# Patient Record
Sex: Female | Born: 1950 | Race: White | Hispanic: No | Marital: Married | Smoking: Former smoker
Health system: Southern US, Community
[De-identification: ages and names within clinical notes are randomized; demographics above are authoritative.]

## PROBLEM LIST (undated history)

## (undated) DIAGNOSIS — E039 Hypothyroidism, unspecified: Secondary | ICD-10-CM

## (undated) DIAGNOSIS — I2510 Atherosclerotic heart disease of native coronary artery without angina pectoris: Secondary | ICD-10-CM

## (undated) DIAGNOSIS — R0989 Other specified symptoms and signs involving the circulatory and respiratory systems: Secondary | ICD-10-CM

## (undated) DIAGNOSIS — E785 Hyperlipidemia, unspecified: Secondary | ICD-10-CM

## (undated) DIAGNOSIS — R011 Cardiac murmur, unspecified: Secondary | ICD-10-CM

## (undated) DIAGNOSIS — M51369 Other intervertebral disc degeneration, lumbar region without mention of lumbar back pain or lower extremity pain: Secondary | ICD-10-CM

## (undated) DIAGNOSIS — I160 Hypertensive urgency: Secondary | ICD-10-CM

## (undated) DIAGNOSIS — K219 Gastro-esophageal reflux disease without esophagitis: Secondary | ICD-10-CM

## (undated) DIAGNOSIS — M545 Low back pain, unspecified: Secondary | ICD-10-CM

## (undated) DIAGNOSIS — I499 Cardiac arrhythmia, unspecified: Secondary | ICD-10-CM

## (undated) DIAGNOSIS — Z952 Presence of prosthetic heart valve: Secondary | ICD-10-CM

## (undated) DIAGNOSIS — R001 Bradycardia, unspecified: Secondary | ICD-10-CM

## (undated) DIAGNOSIS — Z9289 Personal history of other medical treatment: Secondary | ICD-10-CM

## (undated) DIAGNOSIS — M5136 Other intervertebral disc degeneration, lumbar region: Secondary | ICD-10-CM

## (undated) DIAGNOSIS — I10 Essential (primary) hypertension: Secondary | ICD-10-CM

## (undated) DIAGNOSIS — G8929 Other chronic pain: Secondary | ICD-10-CM

## (undated) DIAGNOSIS — I219 Acute myocardial infarction, unspecified: Secondary | ICD-10-CM

## (undated) DIAGNOSIS — F17201 Nicotine dependence, unspecified, in remission: Secondary | ICD-10-CM

## (undated) HISTORY — PX: HEEL SPUR SURGERY: SHX665

## (undated) HISTORY — DX: Essential (primary) hypertension: I10

## (undated) HISTORY — DX: Nicotine dependence, unspecified, in remission: F17.201

## (undated) HISTORY — DX: Bradycardia, unspecified: R00.1

## (undated) HISTORY — PX: HIP ORIF W/ CAPSULOTOMY: SHX1756

## (undated) HISTORY — DX: Personal history of other medical treatment: Z92.89

## (undated) HISTORY — PX: CATARACT EXTRACTION W/ INTRAOCULAR LENS  IMPLANT, BILATERAL: SHX1307

## (undated) HISTORY — DX: Hyperlipidemia, unspecified: E78.5

## (undated) HISTORY — DX: Presence of prosthetic heart valve: Z95.2

## (undated) HISTORY — PX: TOTAL HIP ARTHROPLASTY: SHX124

## (undated) HISTORY — DX: Other specified symptoms and signs involving the circulatory and respiratory systems: R09.89

## (undated) HISTORY — DX: Hypothyroidism, unspecified: E03.9

## (undated) HISTORY — PX: ABDOMINAL HYSTERECTOMY: SHX81

---

## 1976-11-22 HISTORY — PX: TONSILLECTOMY: SUR1361

## 2005-08-12 ENCOUNTER — Encounter: Admission: RE | Admit: 2005-08-12 | Discharge: 2005-08-12 | Payer: Self-pay | Admitting: Family Medicine

## 2009-02-20 ENCOUNTER — Ambulatory Visit: Payer: Self-pay | Admitting: Family Medicine

## 2009-02-20 DIAGNOSIS — M5430 Sciatica, unspecified side: Secondary | ICD-10-CM | POA: Insufficient documentation

## 2009-02-20 DIAGNOSIS — M479 Spondylosis, unspecified: Secondary | ICD-10-CM | POA: Insufficient documentation

## 2009-02-20 DIAGNOSIS — M47817 Spondylosis without myelopathy or radiculopathy, lumbosacral region: Secondary | ICD-10-CM

## 2009-02-20 DIAGNOSIS — I10 Essential (primary) hypertension: Secondary | ICD-10-CM

## 2009-05-17 ENCOUNTER — Inpatient Hospital Stay (HOSPITAL_COMMUNITY): Admission: EM | Admit: 2009-05-17 | Discharge: 2009-05-18 | Payer: Self-pay | Admitting: Emergency Medicine

## 2009-05-17 ENCOUNTER — Ambulatory Visit: Payer: Self-pay | Admitting: Internal Medicine

## 2009-05-18 ENCOUNTER — Encounter: Payer: Self-pay | Admitting: Internal Medicine

## 2009-07-10 ENCOUNTER — Ambulatory Visit: Payer: Self-pay | Admitting: Family Medicine

## 2009-07-10 DIAGNOSIS — M542 Cervicalgia: Secondary | ICD-10-CM

## 2009-07-11 DIAGNOSIS — R0989 Other specified symptoms and signs involving the circulatory and respiratory systems: Secondary | ICD-10-CM

## 2009-07-11 DIAGNOSIS — I498 Other specified cardiac arrhythmias: Secondary | ICD-10-CM

## 2009-07-11 DIAGNOSIS — R002 Palpitations: Secondary | ICD-10-CM | POA: Insufficient documentation

## 2009-07-11 DIAGNOSIS — R011 Cardiac murmur, unspecified: Secondary | ICD-10-CM

## 2009-07-14 ENCOUNTER — Ambulatory Visit: Payer: Self-pay | Admitting: Internal Medicine

## 2009-07-14 DIAGNOSIS — F17200 Nicotine dependence, unspecified, uncomplicated: Secondary | ICD-10-CM | POA: Insufficient documentation

## 2009-07-14 DIAGNOSIS — E7800 Pure hypercholesterolemia, unspecified: Secondary | ICD-10-CM | POA: Insufficient documentation

## 2009-07-31 ENCOUNTER — Ambulatory Visit: Payer: Self-pay | Admitting: Family Medicine

## 2009-07-31 DIAGNOSIS — M25519 Pain in unspecified shoulder: Secondary | ICD-10-CM | POA: Insufficient documentation

## 2009-08-05 ENCOUNTER — Encounter: Payer: Self-pay | Admitting: Internal Medicine

## 2009-08-05 ENCOUNTER — Ambulatory Visit: Payer: Self-pay

## 2009-08-05 ENCOUNTER — Ambulatory Visit: Payer: Self-pay | Admitting: Internal Medicine

## 2009-08-27 ENCOUNTER — Ambulatory Visit: Payer: Self-pay | Admitting: Family Medicine

## 2009-09-03 ENCOUNTER — Ambulatory Visit: Payer: Self-pay | Admitting: Family Medicine

## 2009-09-03 DIAGNOSIS — B369 Superficial mycosis, unspecified: Secondary | ICD-10-CM | POA: Insufficient documentation

## 2009-09-03 LAB — CONVERTED CEMR LAB
ALT: 33 units/L (ref 0–35)
Alkaline Phosphatase: 60 units/L (ref 39–117)
Basophils Relative: 0.9 % (ref 0.0–3.0)
Blood in Urine, dipstick: NEGATIVE
CO2: 29 meq/L (ref 19–32)
Calcium: 10 mg/dL (ref 8.4–10.5)
Cholesterol: 202 mg/dL — ABNORMAL HIGH (ref 0–200)
Direct LDL: 101.9 mg/dL
Eosinophils Absolute: 0.3 10*3/uL (ref 0.0–0.7)
Glucose, Urine, Semiquant: NEGATIVE
HDL: 33.9 mg/dL — ABNORMAL LOW (ref >39.00)
Hemoglobin: 16.1 g/dL — ABNORMAL HIGH (ref 12.0–15.0)
Ketones, urine, test strip: NEGATIVE
Lymphs Abs: 3.2 10*3/uL (ref 0.7–4.0)
MCHC: 33.8 g/dL (ref 30.0–36.0)
Monocytes Relative: 8.4 % (ref 3.0–12.0)
Platelets: 193 10*3/uL (ref 150.0–400.0)
Potassium: 4.6 meq/L (ref 3.5–5.1)
Protein, U semiquant: NEGATIVE
Specific Gravity, Urine: 1.01
TSH: 3.66 microintl units/mL (ref 0.35–5.50)
Total CHOL/HDL Ratio: 6
WBC Urine, dipstick: NEGATIVE
WBC: 10.8 10*3/uL — ABNORMAL HIGH (ref 4.5–10.5)
pH: 6.5

## 2010-01-20 ENCOUNTER — Telehealth: Payer: Self-pay | Admitting: Family Medicine

## 2010-02-12 ENCOUNTER — Ambulatory Visit: Payer: Self-pay | Admitting: Family Medicine

## 2010-02-12 DIAGNOSIS — M1610 Unilateral primary osteoarthritis, unspecified hip: Secondary | ICD-10-CM | POA: Insufficient documentation

## 2010-02-12 DIAGNOSIS — M169 Osteoarthritis of hip, unspecified: Secondary | ICD-10-CM

## 2010-02-12 DIAGNOSIS — L42 Pityriasis rosea: Secondary | ICD-10-CM

## 2010-03-19 ENCOUNTER — Telehealth: Payer: Self-pay | Admitting: Family Medicine

## 2010-06-18 ENCOUNTER — Ambulatory Visit: Payer: Self-pay | Admitting: Family Medicine

## 2010-06-18 DIAGNOSIS — B372 Candidiasis of skin and nail: Secondary | ICD-10-CM

## 2010-07-01 ENCOUNTER — Telehealth: Payer: Self-pay | Admitting: Family Medicine

## 2010-07-23 ENCOUNTER — Telehealth: Payer: Self-pay | Admitting: Family Medicine

## 2010-10-27 ENCOUNTER — Telehealth: Payer: Self-pay | Admitting: Family Medicine

## 2010-12-01 ENCOUNTER — Telehealth: Payer: Self-pay | Admitting: *Deleted

## 2010-12-20 LAB — CONVERTED CEMR LAB
AST: 28 units/L (ref 0–37)
Albumin: 4.2 g/dL (ref 3.5–5.2)
Alkaline Phosphatase: 68 units/L (ref 39–117)
BUN: 27 mg/dL — ABNORMAL HIGH (ref 6–23)
Bilirubin, Direct: 0 mg/dL (ref 0.0–0.3)
CO2: 29 meq/L (ref 19–32)
Calcium: 9.7 mg/dL (ref 8.4–10.5)
Chloride: 103 meq/L (ref 96–112)
Cholesterol: 165 mg/dL (ref 0–200)
Creatinine, Ser: 1 mg/dL (ref 0.4–1.2)
GFR calc non Af Amer: 60.03 mL/min (ref >60)
Glucose, Bld: 101 mg/dL — ABNORMAL HIGH (ref 70–99)
HDL: 30.9 mg/dL — ABNORMAL LOW (ref >39.00)
Total CHOL/HDL Ratio: 5
Total Protein: 8.5 g/dL — ABNORMAL HIGH (ref 6.0–8.3)
Triglycerides: 243 mg/dL — ABNORMAL HIGH (ref 0.0–149.0)
VLDL: 48.6 mg/dL — ABNORMAL HIGH (ref 0.0–40.0)

## 2010-12-22 NOTE — Progress Notes (Signed)
Summary: refill triamtere hctz   Phone Note Call from Patient   Caller: Patient Summary of Call: wantsa 90 day suppy on triamtere hctz  to [facility]  Initial call taken by: Pura Spice, RN,  January 20, 2010 9:04 AM  Follow-up for Phone Call        ok per dr Scotty Court p[t aware.  Follow-up by: Pura Spice, RN,  January 20, 2010 9:04 AM    Prescriptions: TRIAMTERENE-HCTZ 75-50 MG TABS (TRIAMTERENE-HCTZ) 1 once daily for edema and BP  #90 x 1   Entered by:   Pura Spice, RN   Authorized by:   Judithann Sheen MD   Signed by:   Pura Spice, RN on 01/20/2010   Method used:   Electronically to        [facility]* (retail)       [address] [place], [place]  [address]       Ph: [phone]       Fax: [phone]   RxID:   [id number]

## 2010-12-22 NOTE — Assessment & Plan Note (Signed)
Summary: FOLLOW UP ON INFECTION, NO BETTER//SLM   Vital Signs:  Patient profile:   60 year old female Height:      65 inches Weight:      171 pounds BMI:     28.56 O2 Sat:      96 % Temp:     98.6 degrees F Pulse rate:   70 / minute BP sitting:   120 / 82  (left arm)  Vitals Entered By: Pura Spice, RN (June 18, 2010 4:07 PM) CC: refils needed on prednisone and hydrocodone states prednisone hellped but when completed rash and itching came back. refill 90 rx on triamterene and 30 day supply on metoprolol    History of Present Illness: This 88 white married female continues to have back pain as well as some left shoulder pain but which at improved considerably when on prednisone for her dermatological problems there She is in today complaining of a rash under her breast and in the inguinal and perinetal area which itches tremendously when she is hot she continues to need her hydrocodone but has decreased the amount of tablets per day and under good control Blood pressure is well-controlled at 120/82  Allergies: 1)  ! 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  Father alive, he has a history of heart failure and   atrial fib status post cardioversion.  She has a brother, who is alive   and well.      Social History: Last updated: 07/14/2009 Occupation: [employer] employee Married She lives in [place] with her spouse.  She is a   Production designer, theatre/television/film for [employer].  She smokes  one-half pack of cigarettes a day.  No  diet restrictions.  Has regular coffee and sweet teat throughout the  day.  Denies any recent over-the-counter supplements or illicit   substances.   Risk Factors: Smoking Status: current (09/03/2009) Packs/Day: 0.5 (09/03/2009)  Review of Systems      See HPI  The patient denies anorexia, fever, weight loss, weight gain, vision loss, decreased hearing, hoarseness, chest pain, syncope, dyspnea on exertion, peripheral edema, prolonged cough, headaches, hemoptysis, abdominal pain, melena, hematochezia, severe indigestion/heartburn, hematuria, incontinence, genital sores, muscle weakness, suspicious skin lesions, transient blindness, difficulty walking, depression, unusual weight change, abnormal bleeding, enlarged lymph nodes, angioedema, breast masses, and testicular masses.    Physical Exam  General:  Well-developed,well-nourished,in no acute distress; alert,appropriate and cooperative throughout examination Breasts:  erythematous rash on the breast compatible with fungal infection of monilia Lungs:  Normal respiratory effort, chest expands symmetrically. Lungs are clear to auscultation, no crackles or wheezes. Heart:  Normal rate and regular rhythm. S1 and S2 normal without gallop, murmur, click, rub or other extra sounds. Msk:  No deformity or scoliosis noted of thoracic or lumbar spine.   Extremities:  No clubbing, cyanosis, edema, or deformity noted with normal full range of motion of all joints.   Neurologic:  No cranial nerve deficits noted. Station and gait are normal. Plantar reflexes are down-going bilaterally. DTRs are symmetrical throughout. Sensory, motor and coordinative functions appear intact. Skin:  erythematous rash under breast as  over her old monilial   Impression & Recommendations:  Problem # 1:  CANDIDIASIS, SKIN (ICD-112.3) Assessment New Lotrisone cream b.i.d. 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 Take once daily    Hydrocodone-acetaminophen 10-650 Mg Tabs (Hydrocodone-acetaminophen) .Marland Kitchen... 1 q4-6 hprn pain, not over 3 per day  Problem # 4:  ARTHRITIS, BACK (ICD-721.90) Assessment: Unchanged  Problem # 5:  HYPERTENSION (ICD-401.9) Assessment: Improved  Her updated medication list for this problem includes:    Lisinopril 20 Mg Tabs (Lisinopril) .Marland Kitchen... 1 by mouth once daily    Metoprolol Tartrate 25 Mg Tabs (Metoprolol tartrate) .Marland Kitchen..Marland Kitchen Two times a day    Triamterene-hctz 75-50 Mg Tabs (Triamterene-hctz) .Marland Kitchen... 1 once daily for edema and bp  Complete Medication List: 1)  Lisinopril 20 Mg Tabs (Lisinopril) .Marland Kitchen.. 1 by mouth once daily 2)  Aspirin 81 Mg Tabs (Aspirin) .... Take once daily 3)  Metoprolol Tartrate 25 Mg Tabs (Metoprolol tartrate) .... Two times a day 4)  Hydrocodone-acetaminophen 10-650 Mg Tabs (Hydrocodone-acetaminophen) .Marland Kitchen.. 1 q4-6 hprn pain, not over 3 per day 5)  Lotrisone 1-0.05 % Crea (Clotrimazole-betamethasone) .... Apply to radh two times a day thinly but thoroughly 6)  Triamterene-hctz 75-50 Mg Tabs (Triamterene-hctz) .Marland Kitchen.. 1 once daily for edema and bp 7)  Prednisone 20 Mg Tabs (Prednisone) .Marland Kitchen.. 1 three times a day pc x4 dayathen 1 two times a day x 6 days then 1 once daily for rash ,7days then 1/2 tab  Patient Instructions: 1)  Rashgeneralized better but  rash under breast returns 2)  taake prednisone also use lotrisone if not  improved call 3)  rrefilled hydrocodone Prescriptions: HYDROCODONE-ACETAMINOPHEN 10-650 MG TABS (HYDROCODONE-ACETAMINOPHEN) 1 q4-6 hprn pain, not over 3 per day  #100 x 5   Entered and Authorized by:   Judithann Sheen MD   Signed by:   Judithann Sheen MD on 06/18/2010   Method used:   Print then Give to Patient   RxID:    [id number] PREDNISONE 20 MG TABS (PREDNISONE) 1 three times a day pc x4 dayathen 1 two times a day x 6 days then 1 once daily for rash ,7days then 1/2 tab  #40 x 1   Entered and Authorized by:   Judithann Sheen MD   Signed by:   Judithann Sheen MD on 06/18/2010   Method used:   Electronically to        [facility] (retail)       [address]       [place], [place]  [address]       Ph: [phone]       Fax: [phone]   RxID:   [id number]

## 2010-12-22 NOTE — Progress Notes (Signed)
Summary: wants something for pain.   Phone Note Call from Patient   Caller: Patient Summary of Call: wants something else for pain that she can take while at work.call to [facility] Initial call taken by: Pura Spice, RN,  July 01, 2010 10:50 AM  Follow-up for Phone Call        per dr  Scotty Court call in tramodol 50 mg  done.  Follow-up by: Pura Spice, RN,  July 01, 2010 1:59 PM    New/Updated Medications: TRAMADOL HCL 50 MG TABS (TRAMADOL HCL) take 1 or 2 every 4-6 hrs as needed pain. Prescriptions: TRAMADOL HCL 50 MG TABS (TRAMADOL HCL) take 1 or 2 every 4-6 hrs as needed pain.  #60 x 1   Entered by:   Pura Spice, RN   Authorized by:   Judithann Sheen MD   Signed by:   Pura Spice, RN on 07/01/2010   Method used:   Electronically to        [facility] (retail)       [address]       [place], [place]  [address]       Ph: [phone]       Fax: [phone]   RxID:   [id number]

## 2010-12-22 NOTE — Assessment & Plan Note (Signed)
Summary: hip pain/rash /njr   Vital Signs:  Patient profile:   60 year old female Weight:      170 pounds O2 Sat:      96 % Temp:     97.7 degrees F Pulse rate:   96 / minute BP sitting:   140 / 80  (left arm)  Vitals Entered By: Pura Spice, RN (February 12, 2010 12:59 PM) CC: back pain and external rash    History of Present Illness: This 60 year old white female in today complaining of pain in the back as well as the right hip with a past several weeks. Has not been relieved with ibuprofen. Also with peds 5-7 days she developed a generalized rash Blood pressure controlled  Allergies: 1)  ! 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  She is a   Production designer, theatre/television/film for [employer].  She smokes one-half pack of cigarettes a day.  No  diet restrictions.  Has regular coffee and sweet teat throughout the  day.  Denies any recent over-the-counter supplements or illicit   substances.   Risk Factors: Smoking Status: current (09/03/2009) Packs/Day: 0.5 (09/03/2009)  Review of Systems  The patient denies anorexia, fever, weight loss, weight gain, vision loss, decreased hearing, hoarseness, chest pain, syncope, dyspnea on exertion, peripheral edema, prolonged cough, headaches, hemoptysis, abdominal pain, melena, hematochezia, severe indigestion/heartburn, hematuria, incontinence, genital sores, muscle weakness, suspicious skin lesions, transient blindness, difficulty walking,  depression, unusual weight change, abnormal bleeding, enlarged lymph nodes, angioedema, breast masses, and testicular masses.    Physical Exam  General:  Well-developed,well-nourished,in no acute distress; alert,appropriate and cooperative throughout examination Lungs:  Normal respiratory effort, chest expands symmetrically. Lungs are clear to auscultation, no crackles or wheezes. Heart:  Normal rate and regular rhythm. S1 and S2 normal without gallop, murmur, click, rub or other extra sounds. Msk:  tenderness over the right heel pain on movement also minimal tenderness over the right SI joint Pulses:  R and L carotid,radial,femoral,dorsalis pedis and posterior tibial pulses are full and equal bilaterally Skin:  generalized rash compatible with pityriasis rosea   Impression & Recommendations:  Problem # 1:  PITYRIASIS ROSEA (ICD-696.3) Assessment New prednisone  Problem # 2:  OSTEOARTHRITIS, HIP (ICD-715.95) Assessment: New  Her updated medication list for this problem includes:    Aspirin 81 Mg Tabs (Aspirin) .Marland Kitchen... Take once daily    Hydrocodone-acetaminophen 10-650 Mg Tabs (Hydrocodone-acetaminophen) .Marland Kitchen... 1 q4-6 hprn pain, not over 3 per day prednisone decrease in dosage  Problem # 3:  HYPERTENSION (ICD-401.9) Assessment: Improved  Her updated medication list for this problem includes:    Lisinopril 20 Mg Tabs (Lisinopril) .Marland Kitchen... 1 by mouth once daily    Metoprolol Tartrate 25 Mg Tabs (Metoprolol tartrate) .Marland Kitchen..Marland Kitchen Two times a day    Triamterene-hctz 75-50 Mg Tabs (Triamterene-hctz) .Marland Kitchen... 1 once daily for edema and bp  Complete Medication List: 1)  Lisinopril 20 Mg Tabs (Lisinopril) .Marland Kitchen.. 1 by mouth once daily 2)  Aspirin 81 Mg Tabs (Aspirin) .... Take once daily 3)  Metoprolol Tartrate 25 Mg Tabs (Metoprolol tartrate) .... Two times a day 4)  Hydrocodone-acetaminophen 10-650 Mg  Tabs (Hydrocodone-acetaminophen) .Marland Kitchen.. 1 q4-6 hprn pain, not over 3 per day 5)  Lotrisone 1-0.05 % Crea  (Clotrimazole-betamethasone) .... Apply to radh two times a day thinly but thoroughly 6)  Triamterene-hctz 75-50 Mg Tabs (Triamterene-hctz) .Marland Kitchen.. 1 once daily for edema and bp 7)  Prednisone 20 Mg Tabs (Prednisone) .Marland Kitchen.. 1 three times a day pc x4 dayathen 1 two times a day x 6 days then 1 once daily for rash and arthritis  Patient Instructions: 1)  arthritis in rt hip 2)  Rash generalized, pityriasis 3)  Take prednisone as directed 4)  blood pressure good 5)  refilled 1 prescription for hydrocodone Prescriptions: PREDNISONE 20 MG TABS (PREDNISONE) 1 three times a day pc x4 dayathen 1 two times a day x 6 days then 1 once daily for rash and arthritis  #36 x 0   Entered and Authorized by:   Judithann Sheen MD   Signed by:   Judithann Sheen MD on 02/12/2010   Method used:   Print then Give to Patient   RxID:   [id number] HYDROCODONE-ACETAMINOPHEN 10-650 MG TABS (HYDROCODONE-ACETAMINOPHEN) 1 q4-6 hprn pain, not over 3 per day  #100 x 0   Entered and Authorized by:   Judithann Sheen MD   Signed by:   Judithann Sheen MD on 02/12/2010   Method used:   Print then Give to Patient   RxID:   [id number]

## 2010-12-22 NOTE — Progress Notes (Signed)
Summary: Pt req refills for Metoprolol and Tramadol  Phone Note Refill Request Call back at Home Phone [phone]   Refills Requested: Medication #1:  METOPROLOL TARTRATE 25 MG TABS two times a day   Dosage confirmed as above?Dosage Confirmed  Medication #2:  TRAMADOL HCL 50 MG TABS take 1 or 2 every 4-6 hrs as needed pain..   Dosage confirmed as above?Dosage Confirmed Pls call in to [facility] on [facility] [address] [address]    Caryn Section Requested: Telephone to Pharmacy Initial call taken by: Lucy Antigua,  October 27, 2010 9:03 AM  Follow-up for Phone Call        pt call back concerning refills  Follow-up by: Heron Sabins,  October 29, 2010 9:07 AM  Additional Follow-up for Phone Call Additional follow up Details #1::        Rx called to pharmacy Additional Follow-up by: Alfred Levins, CMA,  October 30, 2010 8:32 AM    Prescriptions: TRAMADOL HCL 50 MG TABS (TRAMADOL HCL) take 1 or 2 every 4-6 hrs as needed pain.  #60 x 1   Entered by:   Alfred Levins, CMA   Authorized by:   Judithann Sheen MD   Signed by:   Alfred Levins, CMA on 10/30/2010   Method used:   Electronically to        [facility] (retail)       [address]       [place], [place]  [address]       Ph: [phone]       Fax: [phone]   RxID:   [id number] METOPROLOL TARTRATE 25 MG TABS (METOPROLOL TARTRATE) two times a day  #60 x 2   Entered by:   Alfred Levins, CMA   Authorized by:   Judithann Sheen MD   Signed by:   Alfred Levins, CMA on 10/30/2010   Method used:   Electronically to        [facility] (retail)       [address]       [place], [place]  [address]       Ph: [phone]       Fax: [phone]   RxID:   [id number]

## 2010-12-22 NOTE — Progress Notes (Signed)
Summary: Pt req refill for Hydrocodone. Pt having pharmacy send req  Phone Note Call from Patient Call back at Home Phone [phone]   Caller: Patient Summary of Call: Pt was calling in to get refill of Hydrocodone. Pt was instructed to contact pharmacy for refill req. Pt is still req Almira Coaster to return her call.  Initial call taken by: Lucy Antigua,  March 19, 2010 8:56 AM  Follow-up for Phone Call        spoke to pt call med to [facility] Follow-up by: Pura Spice, RN,  March 19, 2010 10:24 AM  Additional Follow-up for Phone Call Additional follow up Details #1::        called pt aware.  Additional Follow-up by: Pura Spice, RN,  March 19, 2010 10:26 AM    Prescriptions: HYDROCODONE-ACETAMINOPHEN 10-650 MG TABS (HYDROCODONE-ACETAMINOPHEN) 1 q4-6 hprn pain, not over 3 per day  #100 x 2   Entered by:   Pura Spice, RN   Authorized by:   Judithann Sheen MD   Signed by:   Pura Spice, RN on 03/19/2010   Method used:   Telephoned to ...       [facility] ([facility])       [address]       [place], [place]  [address]       Ph: [phone]       Fax: [phone]   RxID:   [id number]

## 2010-12-22 NOTE — Progress Notes (Signed)
Summary: refill metoprolol  Phone Note Call from Patient   Caller: pt  Summary of Call: [facility] Initial call taken by: Pura Spice, RN,  July 23, 2010 9:20 AM    Prescriptions: METOPROLOL TARTRATE 25 MG TABS (METOPROLOL TARTRATE) two times a day  #60 x 2   Entered by:   Josph Macho RMA   Authorized by:   Danise Edge MD   Signed by:   Josph Macho RMA on 07/23/2010   Method used:   Electronically to        [facility] (retail)       [address]       [place], [place]  [address]       Ph: [phone]       Fax: [phone]   RxID:   [id number]

## 2010-12-24 NOTE — Progress Notes (Signed)
Summary: Pt req refills for Lisinopril and Hydrocodone  Phone Note Refill Request Call back at Home Phone [phone] Message from:  Patient on December 01, 2010 8:20 AM  Refills Requested: Medication #1:  LISINOPRIL 20 MG TABS 1 by mouth once daily   Dosage confirmed as above?Dosage Confirmed  Medication #2:  HYDROCODONE-ACETAMINOPHEN 10-650 MG TABS 1 q4-6 hprn pain   Dosage confirmed as above?Dosage Confirmed  Method Requested: Telephone to Pharmacy Initial call taken by: Lucy Antigua,  December 01, 2010 8:20 AM  Follow-up for Phone Call        Rx sent to pharmacy on lisinopril. Follow-up by: Romualdo Bolk, CMA (AAMA),  December 01, 2010 11:50 AM    Prescriptions: HYDROCODONE-ACETAMINOPHEN 10-650 MG TABS (HYDROCODONE-ACETAMINOPHEN) 1 q4-6 hprn pain, not over 3 per day  #100 x 0   Entered by:   Romualdo Bolk, CMA (AAMA)   Authorized by:   Judithann Sheen MD   Signed by:   Romualdo Bolk, CMA (AAMA) on 12/01/2010   Method used:   Telephoned to ...       [facility])       [address]       [place], [place]  [address]       Ph: [phone]       Fax: [phone]   RxID:   [id number] LISINOPRIL 20 MG TABS (LISINOPRIL) 1 by mouth once daily  #30 x 0   Entered by:   Romualdo Bolk, CMA (AAMA)   Authorized by:   Judithann Sheen MD   Signed by:   Romualdo Bolk, CMA (AAMA) on 12/01/2010   Method used:   Electronically to        [facility] (retail)       [address]       [place], [place]  [address]       Ph: [phone]       Fax: [phone]   RxID:   [id number]  Rx called in. Romualdo Bolk, CMA (AAMA)  December 01, 2010 1:41 PM

## 2010-12-30 ENCOUNTER — Other Ambulatory Visit: Payer: Self-pay | Admitting: Family Medicine

## 2010-12-30 DIAGNOSIS — I10 Essential (primary) hypertension: Secondary | ICD-10-CM

## 2010-12-30 DIAGNOSIS — R52 Pain, unspecified: Secondary | ICD-10-CM

## 2010-12-30 DIAGNOSIS — R609 Edema, unspecified: Secondary | ICD-10-CM

## 2011-02-01 ENCOUNTER — Other Ambulatory Visit: Payer: Self-pay | Admitting: Family Medicine

## 2011-02-18 ENCOUNTER — Encounter: Payer: Self-pay | Admitting: Family Medicine

## 2011-03-01 LAB — COMPREHENSIVE METABOLIC PANEL
AST: 25 U/L (ref 0–37)
AST: 31 U/L (ref 0–37)
Albumin: 3.7 g/dL (ref 3.5–5.2)
Alkaline Phosphatase: 62 U/L (ref 39–117)
BUN: 21 mg/dL (ref 6–23)
CO2: 24 mEq/L (ref 19–32)
Calcium: 9.2 mg/dL (ref 8.4–10.5)
Chloride: 103 mEq/L (ref 96–112)
GFR calc Af Amer: 46 mL/min — ABNORMAL LOW (ref >60)
GFR calc Af Amer: 56 mL/min — ABNORMAL LOW (ref >60)
GFR calc non Af Amer: 38 mL/min — ABNORMAL LOW (ref >60)
GFR calc non Af Amer: 46 mL/min — ABNORMAL LOW (ref >60)
Glucose, Bld: 98 mg/dL (ref 70–99)
Potassium: 4 mEq/L (ref 3.5–5.1)
Potassium: 4 mEq/L (ref 3.5–5.1)
Sodium: 137 mEq/L (ref 135–145)
Sodium: 140 mEq/L (ref 135–145)
Total Protein: 7.2 g/dL (ref 6.0–8.3)
Total Protein: 7.6 g/dL (ref 6.0–8.3)

## 2011-03-01 LAB — CBC
HCT: 43 % (ref 36.0–46.0)
Hemoglobin: 16.3 g/dL — ABNORMAL HIGH (ref 12.0–15.0)
MCHC: 33.5 g/dL (ref 30.0–36.0)
MCV: 92.5 fL (ref 78.0–100.0)
MCV: 93.5 fL (ref 78.0–100.0)
Platelets: 211 10*3/uL (ref 150–400)
RBC: 4.65 MIL/uL (ref 3.87–5.11)
RBC: 5.19 MIL/uL — ABNORMAL HIGH (ref 3.87–5.11)
RDW: 14.9 % (ref 11.5–15.5)
WBC: 11.6 10*3/uL — ABNORMAL HIGH (ref 4.0–10.5)

## 2011-03-01 LAB — POCT I-STAT, CHEM 8
BUN: 26 mg/dL — ABNORMAL HIGH (ref 6–23)
Calcium, Ion: 1.06 mmol/L — ABNORMAL LOW (ref 1.12–1.32)
Glucose, Bld: 115 mg/dL — ABNORMAL HIGH (ref 70–99)
HCT: 50 % — ABNORMAL HIGH (ref 36.0–46.0)
Potassium: 3.9 mEq/L (ref 3.5–5.1)
Sodium: 140 mEq/L (ref 135–145)

## 2011-03-01 LAB — DIFFERENTIAL
Basophils Relative: 1 % (ref 0–1)
Eosinophils Absolute: 0.1 10*3/uL (ref 0.0–0.7)
Eosinophils Relative: 1 % (ref 0–5)
Lymphocytes Relative: 20 % (ref 12–46)
Lymphs Abs: 2.3 10*3/uL (ref 0.7–4.0)
Monocytes Absolute: 0.8 10*3/uL (ref 0.1–1.0)
Neutrophils Relative %: 72 % (ref 43–77)

## 2011-03-01 LAB — CARDIAC PANEL(CRET KIN+CKTOT+MB+TROPI)
Relative Index: INVALID (ref 0.0–2.5)
Troponin I: 0.02 ng/mL (ref 0.00–0.06)

## 2011-03-01 LAB — POCT CARDIAC MARKERS
Myoglobin, poc: 167 ng/mL (ref 12–200)
Troponin i, poc: <0.05 ng/mL (ref 0.00–0.09)

## 2011-03-01 LAB — TROPONIN I: Troponin I: 0.03 ng/mL (ref 0.00–0.06)

## 2011-03-08 ENCOUNTER — Other Ambulatory Visit: Payer: Self-pay | Admitting: Family Medicine

## 2011-04-06 NOTE — H&P (Signed)
NAMESINIA, ANTOSH                  ACCOUNT NO.:  [id number]   MEDICAL RECORD NO.:  [id number]          PATIENT TYPE:  INP   LOCATION:  3738                         FACILITY:  [facility]   PHYSICIAN:  Joylene John, MD       DATE OF BIRTH:  Apr 03, 1951   DATE OF ADMISSION:  05/17/2009  DATE OF DISCHARGE:                              HISTORY & PHYSICAL   CHIEF COMPLAINT:  Palpitations lasting for about 20-30 minutes this  morning.   HISTORY OF PRESENT ILLNESS:  This is a 60 year old female with  hypertension, heart valve condition (the patient tells me that it is one  of her manual valve, not sure if it is aortic or mitral) coming in with  palpitations that started around 9 o'clock this morning when the patient  was getting ready for work.  According to the patient, the palpitations  lasted about 20-30 minutes, resolved on their own.  The patient told me  that she was not feeling right and was shaking, over and over she tells  me that it was a scary event.  The patient denies any dizziness, nausea,  vomiting, diaphoresis, vision changes, fevers, or chills.  No recent  travels.  According to the patient, she has known that she has a heart  valve condition for last 5-6 years.  The murmur was discovered on exam  when she was in for foot spur surgery.  The patient has not seen a  cardiologist and does not know which valve it is, neither has had she  had an echo as far as that she can remember.  Appetite has been at  baseline she does not eat much in summers because of the heat.  She has  lost some weight, which is not intentional.  The patient tells me that  the stressors are the same as usual at work.  No new stressors.  Drinks  1 cup of coffee in the morning and 2-3 teas of cups during the day, and  no change in her caffeine intake.  No new exertion and no change in  exercise tolerance.  The patient tells me she has had episodes of  flutter, which lasts few seconds to minutes in the past, but  never like  this.  She cannot tell me if there have been any specific triggers for  these events.   PAST MEDICAL HISTORY:  Hypertension.  She was diagnosed a few years ago.  Systolic blood pressure was found to be in 200s.  She was placed on 2  medications.  At home, the patient tells me systolic blood pressure  usually run in the 170s.  She also has a valve problem, does not know  more details as to how severe of which valve.   FAMILY HISTORY:  Mother has a valve problem as well, not sure which one.  Father with CHF.   ALLERGIES:  No known allergies.   MEDICATIONS:  Hydrochlorothiazide 25 mg once a day and lisinopril 20 mg  once daily.  She has taken her blood pressure medications today.   SOCIAL HISTORY:  She works at [employer] as a Production designer, theatre/television/film.  Lives with her  husband.  Smoker since teenage years, one pack, last about 2 days.  No  alcohol or drugs.   REVIEW OF SYSTEMS:  See above.  Otherwise, 14-point review of systems is  negative.   Chest x-ray which was portable shows lower lobe atelectasis versus  scarring.  No acute pathology noted.  EKG; normal sinus rhythm, left  axis deviation, no old EKG for comparison.  No ST-T changes.   Pertinent labs showed cardiac enzymes first set negative.  Hemoglobin  16.3, 48.5, white count slightly elevated at 11.6, and platelets 211.  Sodium 140, potassium 3.9, chloride 109, bicarb 26, creatinine 1.6, no  baseline to compare, and glucose 115.  Ionized calcium little low at  1.06.   PHYSICAL EXAMINATION:  VITAL SIGNS:  Temperature of 98.5, pulse 88-72,  blood pressure 159/78 to 176/75, respirations 15-20, and the patient is  sating 98-96% on room air.  HEENT:  No icterus, pallor, or JVD appreciated.  No oral lesions  appreciated.  LUNGS:  Clear to auscultation.  CARDIOVASCULAR:  Regular rate and rhythm.  She does have a 3/6 murmur,  left sternal border radiating to the axilla, it is also heard on the  right upper sternal border.  LOWER  EXTREMITIES:  No edema appreciated.   This is a 60 year old female with hypertension, valve condition of  murmur on exam, coming in with palpitations.   Plan is to admit the patient to a tele bed, do serial cardiac enzymes,  and rule her out.  We will also get a TSH to make sure there is no  thyroid problems attributing to her palpitations.  Since the patient has  a murmur and no previous imaging, we will get an echocardiogram.  For  blood pressure control, we will continue her hydrochlorothiazide and  ACE.  We will add a low-dose beta-blocker at 25 mg q.12 h. with hold  parameters for heart rate and blood pressure.  The patient did receive  aspirin in the ER.  We will continue the aspirin.  Plan to repeat labs  and EKG in the morning since she did have a slightly elevated white  count, slightly elevated hemoglobin, and a slightly elevated creatinine.  I have no previous baseline to compare.  Based on the workup and the  lab, further assessment can be made if the patient needs inpatient  workup or can be followed up as outpatient.      Joylene John, MD  Electronically Signed     RP/MEDQ  D:  05/17/2009  T:  05/17/2009  Job:  [id number]

## 2011-04-06 NOTE — Consult Note (Signed)
NAMESANJA, Carrie [name]                  ACCOUNT NO.:  [id number]   MEDICAL RECORD NO.:  [id number]          PATIENT TYPE:  INP   LOCATION:  3738                         FACILITY:  [facility]   PHYSICIAN:  Hillis Range, MD       DATE OF BIRTH:  08-07-1951   DATE OF CONSULTATION:  05/18/2009  DATE OF DISCHARGE:  05/18/2009                                 CONSULTATION   Ms. Carrie [name] is new to our group, she will be followed by Dr. Hillis Range.   PRIMARY CARE PHYSICIAN:  Tawny Asal, MD   Ms. Carrie [name] is a pleasant 60 year old Caucasian female with no known history  of coronary artery disease, but states she was told several years ago  that she had a murmur but has never had followup for this.  She has  episodic fluttering in her chest but states that never lasted more than  a few seconds, normally does not bother her.  She does drink a large  amount of caffeine every day, but this has been an ongoing process for  her.  She is a Production designer, theatre/television/film at [employer].  She states she has a very stressful  job.  She was in her usual state of health yesterday morning she was in  the shower and getting ready for work when she had a sudden episode of  her heart racing.  She states it just took off.  She finished her  shower and continued to have the problem.  She states it lasted for  about 30 minutes.  She denies any presyncope, syncopal episodes, any  chest discomfort, lightheadedness or dizziness, headache or blurred  vision.  She states she just felt weak all over and her character, so  she decided to get evaluated.  By the time she arrived to the emergency  room, it had resolved.  A 12-lead EKG today showing sinus brady in 47.  The patient did have 1 episode on the monitor last night of SVT around  11 o'clock last night, resolved spontaneously.  At that time, the  patient did day state she had some fluttering in her chest and has had  no further problems, has been up ambulating in the hall, has appropriate  heart rate  response without any further palpitations.   PAST MEDICAL HISTORY:  1. Hypertension.  She is compliant with her medications.  2. Questionable valve problems.  3. Palpitations.  4. Tobacco abuse.   SOCIAL HISTORY:  She lives in [place] with her spouse.  She is a  Production designer, theatre/television/film for [employer].  She smokes one-half pack of cigarettes a day.  No  diet restrictions.  Has regular coffee and sweet teat throughout the  day.  Denies any recent over-the-counter supplements or illicit  substances.   FAMILY HISTORY:  Mother alive, she has a valve problem and  hypertension.  Father alive, he has a history of heart failure and  atrial fib status post cardioversion.  She has a brother, who is alive  and well.   REVIEW OF SYSTEMS:  Positive for palpitation, generalized weakness as  described above.  All other systems reviewed and negative.   CURRENT MEDICATIONS:  1. Aspirin 324.  2. Lovenox 40.  3. Hydrochlorothiazide 25.  4. Lisinopril 20.  5. Metoprolol 25 b.i.d.   ALLERGIES:  No known drug allergies.   PHYSICAL EXAMINATION:  VITAL SIGNS:  Temp 99.1, heart rate 48,  respirations 18, blood pressure 132/65, sat 97% on room air.  GENERAL:  No acute distress.  HEENT:  Unremarkable.  NECK:  Supple without lymphadenopathy.  The patient does have a bruit on  the right side.  CARDIOVASCULAR:  S1 and S2.  A 3/6 systolic ejection murmur noted.  LUNGS:  Clear to auscultation.  SKIN:  Warm and dry.  She does have a red rash on the face.  ABDOMEN:  Soft, nontender, positive bowel sounds.  LOWER EXTREMITIES:  Without clubbing, cyanosis, or edema.  NEUROLOGIC:  Alert and oriented x3.   Chest x-ray showing left lower lobe atelectasis versus scarring.  No  acute findings.  EKG, sinus brady at a rate of 47.   LABORATORY WORK:  Hematocrit 43, potassium 4, creatinine 1.1.  TSH  1.386.  Cardiac enzymes negative x3.   IMPRESSION:  1. Systolic ejection murmur.  The patient reports it has been there      for  many years.  No formal evaluation.  We will need      echocardiogram, this can be done outpatient.  2. Right carotid bruit.  We will arrange outpatient carotids.  3. Ongoing tobacco abuse.  Smoking cessation education has been      provided and strongly encouraged.  4. Hypertension.  Blood pressure well controlled.  5. Sinus bradycardia, beta-blocker induced.  The patient is      asymptomatic.  We will continue lisinopril, hydrochlorothiazide,      and metoprolol as started here in the hospital.  The patient is      okay to be discharged home from a cardiac perspective.  I will      range for her to follow up with Dr. Johney [name] after echocardiogram and      carotid Dopplers have been obtained.   Dr. Hillis Range has been into examine and assess.  The patient agrees  with plan of care.      Carrie [name], ACNP      Hillis Range, MD  Electronically Signed    MB/MEDQ  D:  05/18/2009  T:  05/19/2009  Job:  [id number]

## 2011-04-09 NOTE — Discharge Summary (Signed)
NAME:  Carrie [name], Carrie [name]                  ACCOUNT NO.:  [id number]   MEDICAL RECORD NO.:  [id number]          PATIENT TYPE:  INP   LOCATION:  3738                         FACILITY:  [facility]   PHYSICIAN:  Valerie A. Felicity Coyer, MDDATE OF BIRTH:  1951/08/25   DATE OF ADMISSION:  05/17/2009  DATE OF DISCHARGE:  05/18/2009                               DISCHARGE SUMMARY   DISCHARGE DIAGNOSES:  1. Palpitations, question supraventricular tachycardia status post      cardiology evaluation for outpatient echo and continued metoprolol.  2. Hypertension.  3. Systolic cardiac murmur.  4. Tobacco abuse.   DISCHARGE MEDICATIONS:  1. Metoprolol 25 mg b.i.d.  2. Lisinopril 20 mg daily.  3. Hydrochlorothiazide 25 mg daily.  4. Aspirin 81 mg daily.   Also provided prescription for Ambien 5 mg at bedtime p.r.n. insomnia,  dispense 30 with no refills.   HOSPITAL FOLLOWUP:  With Dr. Scotty Court, to call for appointment in 2  weeks following discharge from hospital.  Also with [facility],  Dr. Johney Frame, to be arranged for appointment in 4 weeks after discharge  for echocardiogram and Dopplers.   CONDITION ON DISCHARGE:  Medically improved and stable.   HOSPITAL COURSE:  1. Palpitations.  The patient is a 60 year old woman with history of      hypertension who came to the emergency room due to complaints of      chest tightness with feeling of racing heart beat and palpitations.      Please see admission H and P for full details.  She was admitted to      tele where there was a question of SVT but also noted significant      bradycardia while on beta-blockers.  TSH was normal.  Cardiac      enzymes were negative x3, but because of her symptoms as well as      presence of a cardiac murmur, she was scheduled for echo and      cardiology consult.  Cardiology saw the patient prior to echo being      performed and felt that she was stable for continued outpatient      workup given the patient's anxiousness  for discharge with      resolution of symptoms.  She is continued on low-dose beta-blocker      as described and other hypertension medications as here.  There was      no convincing evidence for SVT on telemetry and further outpatient      monitoring will be arranged by Cardiology      as indicated.  Please see consult note by Dr. Johney Frame for full      details.  2. Other medical issues, stable and she was continued on medications      as prior to admission.  She was also counseled during this      hospitalization on cigarette cessation for general health.      Valerie A. Felicity Coyer, MD  Electronically Signed     Raenette Rover. Felicity Coyer, MD  Electronically Signed    VAL/MEDQ  D:  07/16/2009  T:  07/17/2009  Job:  [id number]

## 2011-05-04 ENCOUNTER — Other Ambulatory Visit: Payer: Self-pay

## 2011-05-04 NOTE — Telephone Encounter (Signed)
Pt called requesting refill of hydrocodone; pt stated pharmacy was telling pt she could not get a refill until 02/17/2011.  Per pt she is no longer taking the Tramadol she flushed the rest of the pills and would like to get her hydrocodone.  The pharmacy was called and the pharmacist stated that the pt picked up a rx of tramadol on 5/17 #100 then again on 04/26/2011 #100; per the pharmacist the medication was held until the pt's doctor's office called in and confirmed her rx.  Per Dr. Scotty Court the pharmacy is to hold the hydrocodone until he speaks with the pt on 05/05/2011.  All rx for tramadol have been discontinued at this time;

## 2011-05-05 NOTE — Telephone Encounter (Signed)
Dr. Scotty Court called and spoke with Carrie [name] about tramadol and hydrocodone; per Dr. Scotty Court Carrie [name] needs to come in for an office visit and Carrie [name] can have a refill on her hydrocodone.  Per Carrie [name] the tramadol were making her sick and she flushed them down the toilet.

## 2011-05-05 NOTE — Telephone Encounter (Signed)
Spoke with pt and she is aware that Dr. Scotty Court would like to speak to her before any refills of hydrocodone are called in

## 2011-05-05 NOTE — Telephone Encounter (Signed)
Pt would like doc stafford to call her today

## 2011-05-06 ENCOUNTER — Encounter: Payer: Self-pay | Admitting: *Deleted

## 2011-05-06 ENCOUNTER — Telehealth: Payer: Self-pay

## 2011-05-06 NOTE — Telephone Encounter (Signed)
Pt called again and stated this is getting ridiculous pt called the pharmacy again and stated the pharmacist told pt her doctor needed to call the pharmacy.  Dr. Scotty Court is aware and has called the pharmacy.

## 2011-05-06 NOTE — Telephone Encounter (Signed)
Pt called and stated that she was confused about what Dr. Scotty Court told pt on yesterday.  Pt informed that per Dr. Scotty Court her hydrocodone would be filled 1 time but for further refills pt had to be seen.  Pt stated she understood and called the pharmacy last night and they told her that she could not have her refill.  Pt made an appointment for June 28,2012 and understands that she needs to be seen for further refills of medication.

## 2011-05-20 ENCOUNTER — Ambulatory Visit: Payer: Self-pay | Admitting: Family Medicine

## 2011-05-28 ENCOUNTER — Telehealth: Payer: Self-pay | Admitting: Family Medicine

## 2011-05-28 NOTE — Telephone Encounter (Signed)
Pt called and has rsc her ov from 06/01/11 to 06/17/11 because dr is going out of town. Pt req that Dr Scotty Court refill pts Hydrocodone 10-650 to [facility] on [address]. Med due to be refilled on 06/05/11.

## 2011-06-01 ENCOUNTER — Ambulatory Visit: Payer: Self-pay | Admitting: Family Medicine

## 2011-06-03 NOTE — Telephone Encounter (Signed)
Pt is aware that rx will be called in and ready for pick up on July 14th.

## 2011-06-03 NOTE — Telephone Encounter (Signed)
Pt called and is checking on status of getting refill of Hydrocodone called in to [facility] on [address]. Med Due to be refilled on 06/05/11. Pls call in asap today. Pt req this on 05/28/11.

## 2011-06-04 ENCOUNTER — Telehealth: Payer: Self-pay | Admitting: Family Medicine

## 2011-06-04 DIAGNOSIS — R52 Pain, unspecified: Secondary | ICD-10-CM

## 2011-06-04 MED ORDER — HYDROCODONE-ACETAMINOPHEN 10-650 MG PO TABS: 1.0000 | ORAL_TABLET | Freq: Four times a day (QID) | ORAL | Status: DC | PRN

## 2011-06-04 NOTE — Telephone Encounter (Signed)
Done

## 2011-06-04 NOTE — Telephone Encounter (Signed)
Ok per Dr. Scotty Court to call in rx for hydrocodone 10-650 #100 x0rf; pt has an appt on 06/04/2011

## 2011-06-04 NOTE — Telephone Encounter (Signed)
Refill Hydrocodone. Checking on status. [facility] on [address].

## 2011-06-09 ENCOUNTER — Other Ambulatory Visit: Payer: Self-pay | Admitting: Family Medicine

## 2011-06-17 ENCOUNTER — Ambulatory Visit (INDEPENDENT_AMBULATORY_CARE_PROVIDER_SITE_OTHER): Payer: Self-pay | Admitting: Family Medicine

## 2011-06-17 ENCOUNTER — Encounter: Payer: Self-pay | Admitting: Family Medicine

## 2011-06-17 VITALS — BP 160/102 | HR 73 | Temp 98.8°F | Wt 166.0 lb

## 2011-06-17 DIAGNOSIS — I10 Essential (primary) hypertension: Secondary | ICD-10-CM

## 2011-06-17 DIAGNOSIS — L259 Unspecified contact dermatitis, unspecified cause: Secondary | ICD-10-CM

## 2011-06-17 DIAGNOSIS — M7552 Bursitis of left shoulder: Secondary | ICD-10-CM

## 2011-06-17 DIAGNOSIS — K6289 Other specified diseases of anus and rectum: Secondary | ICD-10-CM

## 2011-06-17 DIAGNOSIS — G8929 Other chronic pain: Secondary | ICD-10-CM

## 2011-06-17 DIAGNOSIS — M719 Bursopathy, unspecified: Secondary | ICD-10-CM

## 2011-06-17 DIAGNOSIS — R52 Pain, unspecified: Secondary | ICD-10-CM

## 2011-06-17 DIAGNOSIS — L309 Dermatitis, unspecified: Secondary | ICD-10-CM

## 2011-06-17 DIAGNOSIS — M545 Low back pain: Secondary | ICD-10-CM

## 2011-06-17 MED ORDER — PREDNISONE 10 MG PO TABS: ORAL_TABLET | ORAL | Status: DC

## 2011-06-17 MED ORDER — LISINOPRIL 20 MG PO TABS: ORAL_TABLET | ORAL | Status: DC

## 2011-06-17 MED ORDER — HYDROCODONE-ACETAMINOPHEN 10-500 MG PO TABS: 1.0000 | ORAL_TABLET | ORAL | Status: AC | PRN

## 2011-06-17 MED ORDER — TRAMADOL HCL 50 MG PO TABS: 50.0000 mg | ORAL_TABLET | Freq: Four times a day (QID) | ORAL | Status: DC | PRN

## 2011-06-17 MED ORDER — METOPROLOL TARTRATE 25 MG PO TABS: ORAL_TABLET | ORAL | Status: DC

## 2011-06-17 MED ORDER — CLOTRIMAZOLE-BETAMETHASONE 1-0.05 % EX CREA: 1.0000 "application " | TOPICAL_CREAM | Freq: Two times a day (BID) | CUTANEOUS | Status: DC

## 2011-06-17 NOTE — Patient Instructions (Signed)
You have subdeltoid and acromioclavicular bursitis left shoulder Chronic back pain Buy stool softner to stop the rectal irrtati To treat shoulder and rash with prednisone Rash also tx with lotrisone cream twice daily or least 2 weeks

## 2011-06-21 MED ORDER — PREDNISONE 10 MG PO TABS: ORAL_TABLET | ORAL | Status: AC

## 2011-07-28 DIAGNOSIS — M7552 Bursitis of left shoulder: Secondary | ICD-10-CM | POA: Insufficient documentation

## 2011-07-28 NOTE — Progress Notes (Signed)
  Subjective:    Patient ID: Carrie [name], female    DOB: [birth year], 60 y.o.   MRN: [id number] This 60 year old white single female is in for medicine recheck and refills but systolic complains of some rectal bleeding with red blood when she has hard stools Darl Pikes and frequent problem. He said problems and pain in the left shoulder with some swelling over the acromioclavicular joint this is occurred over the past 4-6 weeks pain on collation of the arm and posterior movement is history of chronic low back pain with sciatica on systems review no other new symptoms HPI    Review of Systems see history of present     Objective:   Physical Exam the patient is a well-developed well-nourished slightly overweight white female in no distress Examination of the left shoulder reveals tenderness over the acromioclavicular bursa as well as the subdeltoid bursa, pain on palpation as well as pain on elevation and posterior movement Heart examination reveals grade 2 systolic aortic murmur [place] the back reveals tenderness over the right SI joint Skin nonspecific erythematous ectopic dermatitis over the arms and lower extremities Rectal examination reveals evidence of mild proctitis otherwise negative        Assessment & Plan:  Mild rectal bleeding secondary to proctitis, to add stool softener for hard stools other medications needed Hypertension blood pressure was 160/102 rechecked was 142/90 change in treatment Acromioclavicular bursitis and subdeltoid bursitis left shoulder to treat with prednisone decreasing dosage Dermatitis give with oral prednisone Chronic back pain continue hydrocodone see him in for an and patient can not NSAIDs will also be help with the prednisone treatment

## 2012-12-31 ENCOUNTER — Encounter (HOSPITAL_COMMUNITY): Payer: Self-pay | Admitting: Nurse Practitioner

## 2012-12-31 ENCOUNTER — Emergency Department (HOSPITAL_COMMUNITY): Payer: Self-pay

## 2012-12-31 ENCOUNTER — Emergency Department (HOSPITAL_COMMUNITY)
Admission: EM | Admit: 2012-12-31 | Discharge: 2012-12-31 | Disposition: A | Discharge status: Home / Self Care | Payer: Self-pay | Attending: Emergency Medicine | Admitting: Emergency Medicine

## 2012-12-31 DIAGNOSIS — R5383 Other fatigue: Secondary | ICD-10-CM | POA: Insufficient documentation

## 2012-12-31 DIAGNOSIS — R0602 Shortness of breath: Secondary | ICD-10-CM | POA: Insufficient documentation

## 2012-12-31 DIAGNOSIS — R5381 Other malaise: Secondary | ICD-10-CM | POA: Insufficient documentation

## 2012-12-31 DIAGNOSIS — M129 Arthropathy, unspecified: Secondary | ICD-10-CM | POA: Insufficient documentation

## 2012-12-31 DIAGNOSIS — I10 Essential (primary) hypertension: Secondary | ICD-10-CM | POA: Insufficient documentation

## 2012-12-31 DIAGNOSIS — R002 Palpitations: Secondary | ICD-10-CM | POA: Insufficient documentation

## 2012-12-31 DIAGNOSIS — Z8679 Personal history of other diseases of the circulatory system: Secondary | ICD-10-CM | POA: Insufficient documentation

## 2012-12-31 DIAGNOSIS — F17200 Nicotine dependence, unspecified, uncomplicated: Secondary | ICD-10-CM | POA: Insufficient documentation

## 2012-12-31 DIAGNOSIS — R011 Cardiac murmur, unspecified: Secondary | ICD-10-CM | POA: Insufficient documentation

## 2012-12-31 DIAGNOSIS — Z79899 Other long term (current) drug therapy: Secondary | ICD-10-CM | POA: Insufficient documentation

## 2012-12-31 LAB — CBC
HCT: 42.2 % (ref 36.0–46.0)
MCHC: 35.5 g/dL (ref 30.0–36.0)
Platelets: 155 10*3/uL (ref 150–400)
RDW: 13.9 % (ref 11.5–15.5)
WBC: 6.8 10*3/uL (ref 4.0–10.5)

## 2012-12-31 LAB — BASIC METABOLIC PANEL
BUN: 12 mg/dL (ref 6–23)
Chloride: 100 mEq/L (ref 96–112)
Creatinine, Ser: 0.7 mg/dL (ref 0.50–1.10)
GFR calc Af Amer: >90 mL/min (ref >90)
GFR calc non Af Amer: >90 mL/min (ref >90)
Potassium: 4 mEq/L (ref 3.5–5.1)

## 2012-12-31 LAB — POCT I-STAT TROPONIN I

## 2012-12-31 LAB — PRO B NATRIURETIC PEPTIDE: Pro B Natriuretic peptide (BNP): 4465 pg/mL — ABNORMAL HIGH (ref 0.0–100.0)

## 2012-12-31 MED ORDER — TRAMADOL HCL 50 MG PO TABS
50.0000 mg | ORAL_TABLET | Freq: Once | ORAL | Status: AC
  Administered 2012-12-31: 50 mg via ORAL
  Filled 2012-12-31: qty 1

## 2012-12-31 NOTE — ED Notes (Signed)
Pt states she has felt dizzy and "like my heart is racing" since this afternoon. Denies pain. Reports mild SOB. A&Ox4

## 2012-12-31 NOTE — ED Provider Notes (Signed)
History     CSN: [id number]  Arrival date & time 12/31/12  2028   First MD Initiated Contact with Patient 12/31/12 2226      Chief Complaint  Patient presents with  . Irregular Heart Beat    (Consider location/radiation/quality/duration/timing/severity/associated sxs/prior treatment) HPI  Pt with a history of a systolic murmur presents to the ED with complaint of SOB, heart racing, weakness around 6:30pm. She has a known history of a systolic murmur that's being followed by [facility].  She expresses having intermittent lower extremity swelling over the past few months. As well as some shortness of breath intermittently. Pts asymptomatic upon arriving to the ED.   Past Medical History  Diagnosis Date  . Hypertension   . Systolic murmur   . Carotid bruit   . Bradycardia   . Palpitations   . Arthritis     Past Surgical History  Procedure Laterality Date  . Heel spur surgery      rt foot    Family History  Problem Relation Age of Onset  . Hypertension Mother   . Heart disease Mother   . Hypertension Father   . Heart disease Father     History  Substance Use Topics  . Smoking status: Current Every Day Smoker    Types: Cigarettes  . Smokeless tobacco: Never Used     Comment: 1 pack of cigarettes will last 1.5 days   . Alcohol Use: No    OB History   Grav Para Term Preterm Abortions TAB SAB Ect Mult Living                  Review of Systems  Review of Systems  Gen: no weight loss, fevers, chills, night sweats  Eyes: no discharge or drainage, no occular pain or visual changes  Nose: no epistaxis or rhinorrhea  Mouth: no dental pain, no sore throat  Neck: no neck pain  Lungs:No wheezing, coughing or hemoptysis, SOB CV: no chest pain, palpitations, dependent edema or orthopnea + palpitations Abd: no abdominal pain, nausea, vomiting  GU: no dysuria or gross hematuria  MSK:  Intermittent lower extremity swelling   Neuro: no headache, no focal  neurologic deficits  Skin: no abnormalities Psyche: negative.   Allergies  Morphine  Home Medications   Current Outpatient Rx  Name  Route  Sig  Dispense  Refill  . acetaminophen (TYLENOL) 500 MG tablet   Oral   Take 1,000 mg by mouth every 6 (six) hours as needed for pain. For pai n         . hydrochlorothiazide (HYDRODIURIL) 50 MG tablet   Oral   Take 100 mg by mouth daily.         Marland Kitchen HYDROcodone-acetaminophen (NORCO) 10-325 MG per tablet   Oral   Take 1 tablet by mouth every 6 (six) hours as needed for pain. For pain         . metoprolol (LOPRESSOR) 50 MG tablet   Oral   Take 50 mg by mouth 2 (two) times daily.         . traMADol (ULTRAM) 50 MG tablet   Oral   Take 1 tablet (50 mg total) by mouth every 6 (six) hours as needed for pain.   100 tablet   11     BP 176/82  Pulse 57  Temp(Src) 97 F (36.1 C) (Oral)  Resp 18  SpO2 97%  Physical Exam  Nursing note and vitals reviewed. Constitutional: She appears well-developed  and well-nourished. No distress.  HENT:  Head: Normocephalic and atraumatic.  Eyes: Pupils are equal, round, and reactive to light.  Neck: Normal range of motion. Neck supple.  Cardiovascular: Normal rate and regular rhythm.   Murmur heard.  Systolic murmur is present with a grade of 4/6  Pulmonary/Chest: Effort normal.  Abdominal: Soft.  Neurological: She is alert.  Skin: Skin is warm and dry.    ED Course  Procedures (including critical care time)  Labs Reviewed  BASIC METABOLIC PANEL - Abnormal; Notable for the following:    Glucose, Bld 137 (*)    All other components within normal limits  PRO B NATRIURETIC PEPTIDE - Abnormal; Notable for the following:    Pro B Natriuretic peptide (BNP) 4465.0 (*)    All other components within normal limits  CBC  POCT I-STAT TROPONIN I   No results found.   1. Palpitations       MDM   Date: 12/31/2012  Rate: 62  Rhythm: normal sinus rhythm  QRS Axis: normal  Intervals:  normal  ST/T Wave abnormalities: normal  Conduction Disutrbances:LVH   Narrative Interpretation:   Old EKG Reviewed: none available   Trop, BNP, chest xray and labs are not acute. Findings are not all normal but they do not warrant immediate intervention. Pt no longer having symptoms since in the ER. I discussed case with [facility] and Dr. Anitra Lauth. Pt will need repeat US and halter monitor. Advised to return to the ED if symptoms return.  Pt has been advised of the symptoms that warrant their return to the ED. Patient has voiced understanding and has agreed to follow-up with the PCP or specialist.       Dorthula Matas, PA 01/03/13 [phone]

## 2013-01-03 NOTE — ED Provider Notes (Signed)
Medical screening examination/treatment/procedure(s) were performed by non-physician practitioner and as supervising physician I was immediately available for consultation/collaboration.   Sutton Plake, MD 01/03/13 2103 

## 2013-04-23 ENCOUNTER — Encounter (HOSPITAL_COMMUNITY): Payer: Self-pay | Admitting: Emergency Medicine

## 2013-04-23 ENCOUNTER — Emergency Department (HOSPITAL_COMMUNITY)
Admission: EM | Admit: 2013-04-23 | Discharge: 2013-04-23 | Disposition: A | Discharge status: Home / Self Care | Payer: Self-pay | Attending: Emergency Medicine | Admitting: Emergency Medicine

## 2013-04-23 ENCOUNTER — Emergency Department (HOSPITAL_COMMUNITY): Payer: Self-pay

## 2013-04-23 DIAGNOSIS — R071 Chest pain on breathing: Secondary | ICD-10-CM | POA: Insufficient documentation

## 2013-04-23 DIAGNOSIS — R011 Cardiac murmur, unspecified: Secondary | ICD-10-CM | POA: Insufficient documentation

## 2013-04-23 DIAGNOSIS — Z79899 Other long term (current) drug therapy: Secondary | ICD-10-CM | POA: Insufficient documentation

## 2013-04-23 DIAGNOSIS — M129 Arthropathy, unspecified: Secondary | ICD-10-CM | POA: Insufficient documentation

## 2013-04-23 DIAGNOSIS — F17200 Nicotine dependence, unspecified, uncomplicated: Secondary | ICD-10-CM | POA: Insufficient documentation

## 2013-04-23 DIAGNOSIS — Z8679 Personal history of other diseases of the circulatory system: Secondary | ICD-10-CM | POA: Insufficient documentation

## 2013-04-23 DIAGNOSIS — R0789 Other chest pain: Secondary | ICD-10-CM

## 2013-04-23 DIAGNOSIS — I10 Essential (primary) hypertension: Secondary | ICD-10-CM | POA: Insufficient documentation

## 2013-04-23 LAB — BASIC METABOLIC PANEL
Calcium: 10.1 mg/dL (ref 8.4–10.5)
Creatinine, Ser: 1.35 mg/dL — ABNORMAL HIGH (ref 0.50–1.10)
GFR calc Af Amer: 47 mL/min — ABNORMAL LOW (ref >90)
GFR calc non Af Amer: 41 mL/min — ABNORMAL LOW (ref >90)
Sodium: 135 mEq/L (ref 135–145)

## 2013-04-23 LAB — CBC WITH DIFFERENTIAL/PLATELET
Basophils Absolute: 0.1 10*3/uL (ref 0.0–0.1)
Basophils Relative: 0 % (ref 0–1)
Eosinophils Relative: 1 % (ref 0–5)
HCT: 44.3 % (ref 36.0–46.0)
MCHC: 35.9 g/dL (ref 30.0–36.0)
MCV: 90.6 fL (ref 78.0–100.0)
Monocytes Absolute: 0.9 10*3/uL (ref 0.1–1.0)
Platelets: 255 10*3/uL (ref 150–400)
RDW: 15.7 % — ABNORMAL HIGH (ref 11.5–15.5)
WBC: 12.5 10*3/uL — ABNORMAL HIGH (ref 4.0–10.5)

## 2013-04-23 LAB — D-DIMER, QUANTITATIVE: D-Dimer, Quant: 0.41 ug/mL-FEU (ref 0.00–0.48)

## 2013-04-23 MED ORDER — IBUPROFEN 800 MG PO TABS: 800.0000 mg | ORAL_TABLET | Freq: Three times a day (TID) | ORAL | Status: DC

## 2013-04-23 MED ORDER — HYDROCODONE-ACETAMINOPHEN 5-325 MG PO TABS: 2.0000 | ORAL_TABLET | ORAL | Status: DC | PRN

## 2013-04-23 NOTE — ED Provider Notes (Signed)
History     CSN: [id number]  Arrival date & time 04/23/13  9562   First MD Initiated Contact with Patient 04/23/13 [phone]      Chief Complaint  Patient presents with  . Breast Pain    (Consider location/radiation/quality/duration/timing/severity/associated sxs/prior treatment) HPI Comments: Patient presents to ER with complaints of pain under her left breast area. Patient reports that she is experiencing a sharp pain under her left breast and lateral to the head area that worsens with movement. Pain has been present for several days. She is not short of breath, but the pain worsens when she takes a breath. She denies any injury to the area.   Past Medical History  Diagnosis Date  . Hypertension   . Systolic murmur   . Carotid bruit   . Bradycardia   . Palpitations   . Arthritis     Past Surgical History  Procedure Laterality Date  . Heel spur surgery      rt foot    Family History  Problem Relation Age of Onset  . Hypertension Mother   . Heart disease Mother   . Hypertension Father   . Heart disease Father     History  Substance Use Topics  . Smoking status: Current Every Day Smoker    Types: Cigarettes  . Smokeless tobacco: Never Used     Comment: 1 pack of cigarettes will last 1.5 days   . Alcohol Use: No    OB History   Grav Para Term Preterm Abortions TAB SAB Ect Mult Living                  Review of Systems  Respiratory: Negative for cough and shortness of breath.   Cardiovascular: Positive for chest pain.  All other systems reviewed and are negative.    Allergies  Morphine  Home Medications   Current Outpatient Rx  Name  Route  Sig  Dispense  Refill  . acetaminophen (TYLENOL) 500 MG tablet   Oral   Take 500 mg by mouth every 6 (six) hours as needed for pain.          . metoprolol (LOPRESSOR) 50 MG tablet   Oral   Take 50 mg by mouth 2 (two) times daily.         . traMADol (ULTRAM) 50 MG tablet   Oral   Take 1 tablet (50 mg  total) by mouth every 6 (six) hours as needed for pain.   100 tablet   11   . triamterene-hydrochlorothiazide (MAXZIDE) 75-50 MG per tablet   Oral   Take 1 tablet by mouth daily.           BP 171/80  Pulse 65  Temp(Src) 97.8 F (36.6 C) (Oral)  Resp 16  SpO2 100%  Physical Exam  Constitutional: She is oriented to person, place, and time. She appears well-developed and well-nourished. No distress.  HENT:  Head: Normocephalic and atraumatic.  Right Ear: Hearing normal.  Left Ear: Hearing normal.  Nose: Nose normal.  Mouth/Throat: Oropharynx is clear and moist and mucous membranes are normal.  Eyes: Conjunctivae and EOM are normal. Pupils are equal, round, and reactive to light.  Neck: Normal range of motion. Neck supple.  Cardiovascular: Regular rhythm, S1 normal and S2 normal.  Exam reveals no gallop and no friction rub.   No murmur heard. Pulmonary/Chest: Effort normal and breath sounds normal. No respiratory distress. She exhibits tenderness. She exhibits no crepitus.    Abdominal: Soft.  Normal appearance and bowel sounds are normal. There is no hepatosplenomegaly. There is no tenderness. There is no rebound, no guarding, no tenderness at McBurney's point and negative Murphy's sign. No hernia.  Musculoskeletal: Normal range of motion.  Neurological: She is alert and oriented to person, place, and time. She has normal strength. No cranial nerve deficit or sensory deficit. Coordination normal. GCS eye subscore is 4. GCS verbal subscore is 5. GCS motor subscore is 6.  Skin: Skin is warm, dry and intact. No rash noted. No cyanosis.  Psychiatric: She has a normal mood and affect. Her speech is normal and behavior is normal. Thought content normal.    ED Course  Procedures (including critical care time)  Labs Reviewed  CBC WITH DIFFERENTIAL - Abnormal; Notable for the following:    WBC 12.5 (*)    Hemoglobin 15.9 (*)    RDW 15.7 (*)    Neutro Abs 9.4 (*)    All other  components within normal limits  BASIC METABOLIC PANEL - Abnormal; Notable for the following:    Glucose, Bld 117 (*)    BUN 27 (*)    Creatinine, Ser 1.35 (*)    GFR calc non Af Amer 41 (*)    GFR calc Af Amer 47 (*)    All other components within normal limits  D-DIMER, QUANTITATIVE   Dg Ribs Unilateral W/chest Left  04/23/2013   *RADIOLOGY REPORT*  Clinical Data: Status post fall approximately 2 weeks ago with a blow to the left chest.  Left rib pain.  LEFT RIBS AND CHEST - 3+ VIEW  Comparison: PA and lateral chest 12/31/2012.  Findings: Lungs are clear with somewhat coarsened pulmonary interstitium.  No pneumothorax or pleural fluid.  Heart size is normal.  No rib fracture is identified.  IMPRESSION: No acute finding.   Original Report Authenticated By: Holley Dexter, M.D.     Diagnosis: Chest wall pain    MDM  Patient presents to the ER with complaints of pain in the left lateral chest wall area. She has point tenderness that is reproducible. She feels like there is a lump there, I do not feel any deformity. Pressing area does not reveal crepitance, but she does have significant increase in her pain. There is nothing to indicate acute coronary syndrome or cardiac etiology of the pain. It appeared musculoskeletal by examination, but PE was also considered, although less likely than musculoskeletal. D-dimer was negative which I feel is good evidence that she does not have a PE in this patient with very atypical symptoms. Patient will be treated with analgesia.        Gilda Crease, MD 04/23/13 1143

## 2013-04-23 NOTE — ED Notes (Signed)
Pt presenting to ed with c/o left side breast pain with knot under her left breast

## 2013-04-23 NOTE — Progress Notes (Signed)
[facility] has seen patient and provided her with a OC application. °

## 2013-09-18 ENCOUNTER — Other Ambulatory Visit: Payer: Self-pay | Admitting: Family Medicine

## 2013-09-27 ENCOUNTER — Other Ambulatory Visit: Payer: Self-pay | Admitting: Family Medicine

## 2013-09-27 ENCOUNTER — Ambulatory Visit
Admission: RE | Admit: 2013-09-27 | Discharge: 2013-09-27 | Disposition: A | Discharge status: Home / Self Care | Payer: No Typology Code available for payment source | Source: Ambulatory Visit | Attending: Family Medicine | Admitting: Family Medicine

## 2013-09-27 DIAGNOSIS — M545 Low back pain: Secondary | ICD-10-CM

## 2014-05-24 ENCOUNTER — Ambulatory Visit (INDEPENDENT_AMBULATORY_CARE_PROVIDER_SITE_OTHER): Payer: Medicare Other

## 2014-05-24 ENCOUNTER — Ambulatory Visit (INDEPENDENT_AMBULATORY_CARE_PROVIDER_SITE_OTHER): Payer: Medicare Other | Admitting: Family Medicine

## 2014-05-24 VITALS — BP 175/67 | HR 64 | Temp 98.0°F | Resp 16 | Ht 64.25 in | Wt 173.6 lb

## 2014-05-24 DIAGNOSIS — J069 Acute upper respiratory infection, unspecified: Secondary | ICD-10-CM

## 2014-05-24 DIAGNOSIS — M545 Low back pain, unspecified: Secondary | ICD-10-CM

## 2014-05-24 DIAGNOSIS — M5137 Other intervertebral disc degeneration, lumbosacral region: Secondary | ICD-10-CM

## 2014-05-24 DIAGNOSIS — J209 Acute bronchitis, unspecified: Secondary | ICD-10-CM

## 2014-05-24 DIAGNOSIS — N289 Disorder of kidney and ureter, unspecified: Secondary | ICD-10-CM

## 2014-05-24 DIAGNOSIS — I739 Peripheral vascular disease, unspecified: Secondary | ICD-10-CM

## 2014-05-24 DIAGNOSIS — I10 Essential (primary) hypertension: Secondary | ICD-10-CM

## 2014-05-24 DIAGNOSIS — M5136 Other intervertebral disc degeneration, lumbar region: Secondary | ICD-10-CM

## 2014-05-24 DIAGNOSIS — R079 Chest pain, unspecified: Secondary | ICD-10-CM

## 2014-05-24 DIAGNOSIS — J9801 Acute bronchospasm: Secondary | ICD-10-CM

## 2014-05-24 DIAGNOSIS — R011 Cardiac murmur, unspecified: Secondary | ICD-10-CM

## 2014-05-24 LAB — CBC WITH DIFFERENTIAL/PLATELET
Basophils Absolute: 0.1 10*3/uL (ref 0.0–0.1)
Basophils Relative: 1 % (ref 0–1)
EOS ABS: 0.5 10*3/uL (ref 0.0–0.7)
EOS PCT: 5 % (ref 0–5)
HEMATOCRIT: 40.4 % (ref 36.0–46.0)
HEMOGLOBIN: 14 g/dL (ref 12.0–15.0)
LYMPHS ABS: 2 10*3/uL (ref 0.7–4.0)
LYMPHS PCT: 21 % (ref 12–46)
MCH: 30.6 pg (ref 26.0–34.0)
MCHC: 34.7 g/dL (ref 30.0–36.0)
MCV: 88.2 fL (ref 78.0–100.0)
MONOS PCT: 10 % (ref 3–12)
Monocytes Absolute: 1 10*3/uL (ref 0.1–1.0)
Neutro Abs: 6 10*3/uL (ref 1.7–7.7)
Neutrophils Relative %: 63 % (ref 43–77)
Platelets: 238 10*3/uL (ref 150–400)
RBC: 4.58 MIL/uL (ref 3.87–5.11)
RDW: 14.6 % (ref 11.5–15.5)
WBC: 9.5 10*3/uL (ref 4.0–10.5)

## 2014-05-24 LAB — COMPREHENSIVE METABOLIC PANEL
ALT: 16 U/L (ref 0–35)
AST: 28 U/L (ref 0–37)
Albumin: 4.3 g/dL (ref 3.5–5.2)
Alkaline Phosphatase: 73 U/L (ref 39–117)
BILIRUBIN TOTAL: 0.5 mg/dL (ref 0.2–1.2)
BUN: 19 mg/dL (ref 6–23)
CALCIUM: 9.5 mg/dL (ref 8.4–10.5)
CHLORIDE: 103 meq/L (ref 96–112)
CO2: 26 meq/L (ref 19–32)
CREATININE: 1.35 mg/dL — AB (ref 0.50–1.10)
GLUCOSE: 108 mg/dL — AB (ref 70–99)
Potassium: 4 mEq/L (ref 3.5–5.3)
Sodium: 140 mEq/L (ref 135–145)
TOTAL PROTEIN: 7.6 g/dL (ref 6.0–8.3)

## 2014-05-24 LAB — POCT URINALYSIS DIPSTICK
Bilirubin, UA: NEGATIVE
Glucose, UA: NEGATIVE
Ketones, UA: NEGATIVE
NITRITE UA: NEGATIVE
PH UA: 6.5
Protein, UA: 30
RBC UA: NEGATIVE
Spec Grav, UA: 1.02
Urobilinogen, UA: 2

## 2014-05-24 MED ORDER — IPRATROPIUM BROMIDE 0.03 % NA SOLN: 2.0000 | Freq: Two times a day (BID) | NASAL | Status: DC

## 2014-05-24 MED ORDER — LISINOPRIL 10 MG PO TABS: 10.0000 mg | ORAL_TABLET | Freq: Every day | ORAL | Status: DC

## 2014-05-24 MED ORDER — HYDROCODONE-ACETAMINOPHEN 5-325 MG PO TABS: 1.0000 | ORAL_TABLET | Freq: Every evening | ORAL | Status: DC | PRN

## 2014-05-24 MED ORDER — AZITHROMYCIN 250 MG PO TABS: ORAL_TABLET | ORAL | Status: DC

## 2014-05-24 MED ORDER — BENZONATATE 100 MG PO CAPS: 100.0000 mg | ORAL_CAPSULE | Freq: Three times a day (TID) | ORAL | Status: DC | PRN

## 2014-05-24 NOTE — Progress Notes (Addendum)
Patient ID: MARSELLA [name], female   DOB: 06-May-1951, 63 y.o.   MRN: [id number] This chart was scribed for Wardell Honour, MD by Jeanell Sparrow, ED Scribe. This patient was seen in room 2 and the patient's care was started at 1:18 PM.   Subjective:    Patient ID: Carrie [name], female    DOB: [birth year], 63 y.o.   MRN: [id number]  05/24/2014  Bronchitis, Back Pain and Leg Pain   HPI HPI Comments: Carrie [name] is a 63 y.o. female who presents to the [facility] complaining of a cough that started 3 days ago. She states that she has been acutely sick for 3 days. She reports that she started off with a sore throat but the sore throat went away now. She states that she had a fever of 101 this morning. She reports associated congestion, rhinorrhea, and stuffy nose. She also reports diarrhea. She denies any nausea, emesis, and ear pain. Pt also denies any heart burn and bloody or black stool.  She has tried Dayquil for symptoms.    She states that she had eye surgery 2 months ago and had been out of work. She reports that ever since she has returned to work she has had leg and back pain. She states that her legs feel heavy. She reports numbness and tingling in her legs intermittently. She also reports weakness in her legs, but it does get in the way of daily activities.  She does report that legs get heavy with prolonged ambulation; she recently underwent lumbar spine films that revealed aortic calcification.  +current smoker.  She was being treated by previous PCP for chronic lower back pain/DDD lumbar spine.  Last lumbar films 2014.  Previously followed by PCP who cared for indigent population; pt now has Medicare and needs to establish with PCP.  She does not take any medication for pain during the day; she usually needs hydrocodone at nighttime.  She has history of known heart murmur.  S/p echo 15 years ago; not sure of which valve that is involved.  She does admit to exertional chest pain  when she cuts the grass.  Does not occur every time but occurs intermittently.  Denies radiation of pain into jaw, shoulder,or back. Denies SOB, diaphoresis, nausea.  Also will get chest pain when walking to the mail box and back; chest pain usually develops when walking back from mailbox.    HTN: maintained on Maxzide and Metoprolol.  Was previously taking Metoprolol 50mg  tid but PCP decreased to bid.  Previously took Lisinopril but was stopped and not sure why medication was stopped.  Pt not aware of elevated kidney function with previous lab draws.   Pt reports a hx of smoking. She reports no hx of overnight hospitalization. She states that she had hip surgery years ago.   Pt states that her father has a pacemaker and a hx of HTN. She states that he has no hx of cancer or MI. Pt reports that her mother has a heart artery blockage and that she has to have pacemaker put in. She states that she no hx of cancer. She reports that she has two brothers and that one of them has high cholesterol. She states that she is unaware of any other health problems.  Pt states that she has been married for 13 years. She reports that she has 2 kids and 3 grandchildren. She reports that she lives with her husband and that she  work at [employer] in [place]. She reports no alcohol use. She states that she does not exercise aside from walking.     No PCP  Review of Systems  Constitutional: Positive for fever and chills. Negative for diaphoresis.  HENT: Positive for congestion, rhinorrhea and sore throat. Negative for ear pain.   Respiratory: Positive for cough and wheezing. Negative for shortness of breath.   Cardiovascular: Positive for chest pain. Negative for palpitations and leg swelling.  Gastrointestinal: Positive for diarrhea. Negative for nausea, vomiting and abdominal pain.  Musculoskeletal: Positive for back pain and myalgias.  Neurological: Positive for weakness and numbness.    Past Medical  History  Diagnosis Date   Hypertension    Systolic murmur    Carotid bruit    Bradycardia    Palpitations    Arthritis     DDD lumbar   Past Surgical History  Procedure Laterality Date   Heel spur surgery      rt foot   Cataract extraction, bilateral      cataracts removed from both eyes   Hip orif w/ capsulotomy      Allergies  Allergen Reactions   Morphine Nausea And Vomiting   Current Outpatient Prescriptions  Medication Sig Dispense Refill   metoprolol (LOPRESSOR) 50 MG tablet Take 50 mg by mouth 2 (two) times daily.       triamterene-hydrochlorothiazide (MAXZIDE) 75-50 MG per tablet Take 1 tablet by mouth daily.       azithromycin (ZITHROMAX) 250 MG tablet Two tablets daily x 1 day then one tablet daily x 4 days  6 tablet  0   benzonatate (TESSALON) 100 MG capsule Take 1-2 capsules (100-200 mg total) by mouth 3 (three) times daily as needed for cough.  40 capsule  0   HYDROcodone-acetaminophen (NORCO/VICODIN) 5-325 MG per tablet Take 1 tablet by mouth at bedtime as needed.  30 tablet  0   ipratropium (ATROVENT) 0.03 % nasal spray Place 2 sprays into the nose 2 (two) times daily.  30 mL  0   lisinopril (PRINIVIL,ZESTRIL) 10 MG tablet Take 1 tablet (10 mg total) by mouth daily.  30 tablet  3   traMADol (ULTRAM) 50 MG tablet Take 1 tablet (50 mg total) by mouth every 6 (six) hours as needed for pain.  100 tablet  11   No current facility-administered medications for this visit.   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      Lives: with husband, 2 dogs.      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 Constitutional: She is oriented to person, place, and time. She appears well-developed and well-nourished. No distress.  HENT:  Head: Normocephalic and atraumatic.  Right Ear: External ear normal.  Left Ear: External ear normal.  Nose: Nose normal.  Mouth/Throat: Oropharynx is clear and moist.  Eyes: Conjunctivae and EOM are normal. Pupils are equal, round, and reactive to light.  Neck: Normal range of motion. Neck supple. Carotid bruit is not present. No tracheal deviation present. No thyromegaly present.  Cardiovascular: Normal rate, regular rhythm and intact distal pulses.  Exam reveals no gallop and no friction rub.   Murmur heard. 3/6 systolic murmur that radiates to carotid arteries. +1 dorsalis pedis pulse on left foot. No feeling on the right foot.  Pulmonary/Chest: Effort normal. No respiratory distress. She has wheezes. She has no rales.  Scant expiratory and inspiratory wheezes on right upper lobe.  Abdominal: Soft. Bowel sounds are normal. She exhibits no distension and no mass. There is no tenderness. There is no rebound and no guarding.  Musculoskeletal: Normal range of motion.  Lymphadenopathy:    She has no cervical adenopathy.  Neurological: She is  alert and oriented to person, place, and time. No cranial nerve deficit.  Skin: Skin is warm and dry. No rash noted. She is not diaphoretic. No erythema. No pallor.  Psychiatric: She has a normal mood and affect. Her behavior is normal.   [facility] reading (PRIMARY) by  Dr. Tamala Julian.  CXR:  3 mm nodule RLL.  EKG: NSR.  LVH.  Results for orders placed in visit on 05/24/14  POCT URINALYSIS DIPSTICK      Result Value Ref Range   Color, UA yellow     Clarity, UA clear     Glucose, UA neg     Bilirubin, UA neg     Ketones, UA neg     Spec Grav, UA 1.020     Blood, UA neg     pH, UA 6.5     Protein, UA 30     Urobilinogen, UA 2.0     Nitrite, UA neg     Leukocytes, UA Trace         Assessment & Plan:  Acute upper respiratory infections of unspecified site - Plan: CBC with Differential, DG Chest 2 View  Essential hypertension, benign - Plan: Comprehensive metabolic panel, POCT urinalysis dipstick  Bilateral low back pain without sciatica - Plan: Ambulatory referral to Orthopedic Surgery  Claudication - Plan: Ambulatory referral to Cardiology  Heart murmur - Plan: Ambulatory referral to Cardiology  Degenerative disc disease, lumbar - Plan: Ambulatory referral to Orthopedic Surgery  Chest pain, unspecified chest pain type - Plan: Ambulatory referral to Cardiology, EKG 12-Lead  Renal insufficiency  Acute bronchitis, unspecified organism  Bronchospasm, acute  1. Acute bronchitis:  New. Zpack, Atrovent nasal spray, Tessalon perles provided. 2.  Bronchospasm:  New. Mild.  Secondary  to acute bronchitis and likely underlying COPD. 3.  Chest pain:  New. Exertional.  Refer to cardiology; advised to avoid exertion or exercise until cardiology evaluation.  Start ASA 81mg  daily.  To ED for recurrent chest pain.  4.  Claudication:  New.  Refer to cardiology for ABIs and arterial studies.  Chronic tobacco abuse and HTN. 5.  Heart murmur:  Chronic per patient.  Last echo 15 years ago; refer to  cardiology for evaluation. 6.  DDD lumbar/chronic lower back pain:  New to this provider; no previous ortho referral due to lack of insurance; rx for Hydrocodone 5/325 one at bedtime PRN #30 no refills. 7.  Renal insufficiency:  New; present on labs one year ago; obtain u/a, CMET. 8. HTN: uncontrolled; continue Metoprolol and Maxzide; add Lisinopril 10mg  daily due to renal insufficiency.  Consider decreasing Maxzide in future and increasing Lisinopril dose.  Obtain u/a, [facility].   Meds ordered this encounter  Medications   azithromycin (ZITHROMAX) 250 MG tablet    Sig: Two tablets daily x 1 day then one tablet daily x 4 days    Dispense:  6 tablet    Refill:  0   ipratropium (ATROVENT) 0.03 % nasal spray    Sig: Place 2 sprays into the nose 2 (two) times daily.    Dispense:  30 mL    Refill:  0   benzonatate (TESSALON) 100 MG capsule    Sig: Take 1-2 capsules (100-200 mg total) by mouth 3 (three) times daily as needed for cough.    Dispense:  40 capsule    Refill:  0   HYDROcodone-acetaminophen (NORCO/VICODIN) 5-325 MG per tablet    Sig: Take 1 tablet by mouth at bedtime as needed.    Dispense:  30 tablet    Refill:  0   lisinopril (PRINIVIL,ZESTRIL) 10 MG tablet    Sig: Take 1 tablet (10 mg total) by mouth daily.    Dispense:  30 tablet    Refill:  3    No Follow-up on file.   I personally performed the services described in this documentation, which was scribed in my presence.  The recorded information has been reviewed and is accurate.  Reginia Forts, M.D.  [facility] [address] [place], [place]  [address] [phone] phone [phone] fax

## 2014-06-08 ENCOUNTER — Ambulatory Visit (INDEPENDENT_AMBULATORY_CARE_PROVIDER_SITE_OTHER): Payer: Medicare Other | Admitting: Family Medicine

## 2014-06-08 ENCOUNTER — Telehealth: Payer: Self-pay

## 2014-06-08 VITALS — BP 144/74 | HR 65 | Temp 98.6°F | Resp 16 | Ht 64.0 in | Wt 174.0 lb

## 2014-06-08 DIAGNOSIS — M545 Low back pain, unspecified: Secondary | ICD-10-CM

## 2014-06-08 DIAGNOSIS — IMO0002 Reserved for concepts with insufficient information to code with codable children: Secondary | ICD-10-CM

## 2014-06-08 DIAGNOSIS — M5416 Radiculopathy, lumbar region: Secondary | ICD-10-CM

## 2014-06-08 MED ORDER — HYDROCODONE-ACETAMINOPHEN 5-325 MG PO TABS: ORAL_TABLET | ORAL | Status: DC

## 2014-06-08 MED ORDER — PREDNISONE 20 MG PO TABS: ORAL_TABLET | ORAL | Status: DC

## 2014-06-08 NOTE — Patient Instructions (Addendum)
Keep your appointment with the back doctor.  Take the pain pills one twice daily only when the for severe pain. Do not exceed the prescribed dosage. If you find you need to continue on pain medications, please see Dr. Tamala Julian back and let her decide on the refill question.  Take the prednisone 3 pills daily for 2 days, then daily for 2 days, then one to 2 days for inflammation and back   Return as needed

## 2014-06-08 NOTE — Telephone Encounter (Signed)
Refill on HYDROcodone-acetaminophen (NORCO/VICODIN) 5-325 MG

## 2014-06-08 NOTE — Telephone Encounter (Signed)
Pt seen today

## 2014-06-08 NOTE — Progress Notes (Signed)
Subjective: 63 year old lady who was seen by Dr. Tamala Julian 15 days ago. She had back pain with right lumbar radiculopathy. She has been referred to a back specialist. She was given 30 hydrocodone to take one daily. The pain has been such that she has taken one twice daily, and needs more medicine until she can see the specialist.  Objective: Alert and oriented. Multiple bruises on her hands which she says always happen, were not traumatic. She is tender in the lumbosacral region, more to the right. Straight leg raising test essentially negative. She describes pain going down her leg. No ankle edema. Legs are fairly normal.  Assessment: Right lumbar radiculopathy Low back pain  Plan:  Refill the hydrocodone and allow her to take it twice daily over the next couple of weeks. Future care should be provided by the specialist, but if she needs more pain medicines from here she should come back and discuss again with Dr. Tamala Julian. With the radicular nature of the pain subsided to give her a tapered dose of prednisone.

## 2014-06-14 ENCOUNTER — Ambulatory Visit (INDEPENDENT_AMBULATORY_CARE_PROVIDER_SITE_OTHER): Payer: Medicare Other | Admitting: Internal Medicine

## 2014-06-14 ENCOUNTER — Encounter: Payer: Self-pay | Admitting: Internal Medicine

## 2014-06-14 VITALS — BP 171/83 | HR 64 | Ht 65.5 in | Wt 176.0 lb

## 2014-06-14 DIAGNOSIS — R011 Cardiac murmur, unspecified: Secondary | ICD-10-CM

## 2014-06-14 DIAGNOSIS — I200 Unstable angina: Secondary | ICD-10-CM | POA: Diagnosis not present

## 2014-06-14 DIAGNOSIS — R079 Chest pain, unspecified: Secondary | ICD-10-CM | POA: Diagnosis not present

## 2014-06-14 DIAGNOSIS — I119 Hypertensive heart disease without heart failure: Secondary | ICD-10-CM

## 2014-06-14 DIAGNOSIS — F17200 Nicotine dependence, unspecified, uncomplicated: Secondary | ICD-10-CM

## 2014-06-14 DIAGNOSIS — Z720 Tobacco use: Secondary | ICD-10-CM

## 2014-06-14 MED ORDER — ASPIRIN EC 81 MG PO TBEC: 81.0000 mg | DELAYED_RELEASE_TABLET | Freq: Every day | ORAL | Status: DC

## 2014-06-14 MED ORDER — NITROGLYCERIN 0.4 MG SL SUBL: 0.4000 mg | SUBLINGUAL_TABLET | SUBLINGUAL | Status: DC | PRN

## 2014-06-14 MED ORDER — LISINOPRIL 40 MG PO TABS: 40.0000 mg | ORAL_TABLET | Freq: Every day | ORAL | Status: DC

## 2014-06-14 NOTE — Patient Instructions (Addendum)
Your physician recommends that you schedule a follow-up appointment in: 4 weeks with Richardson Dopp, PA  Your physician has requested that you have a cardiac catheterization. Cardiac catheterization is used to diagnose and/or treat various heart conditions. Doctors may recommend this procedure for a number of different reasons. The most common reason is to evaluate chest pain. Chest pain can be a symptom of coronary artery disease (CAD), and cardiac catheterization can show whether plaque is narrowing or blocking your heart's arteries. This procedure is also used to evaluate the valves, as well as measure the blood flow and oxygen levels in different parts of your heart. For further information please visit [facility]. Please follow instruction sheet, as given.---WILL CALL MON TO SCHEDULE  Your physician has requested that you have an echocardiogram. Echocardiography is a painless test that uses sound waves to create images of your heart. It provides your doctor with information about the size and shape of your heart and how well your heart's chambers and valves are working. This procedure takes approximately one hour. There are no restrictions for this procedure.  Your physician has recommended you make the following change in your medication:  1) Increase Lisinopril to 40mg  daily 2) Start Aspirin 81mg  daily 3) NTG  0.4mg  as need

## 2014-06-16 ENCOUNTER — Encounter: Payer: Self-pay | Admitting: Internal Medicine

## 2014-06-16 DIAGNOSIS — I200 Unstable angina: Secondary | ICD-10-CM | POA: Insufficient documentation

## 2014-06-16 DIAGNOSIS — I119 Hypertensive heart disease without heart failure: Secondary | ICD-10-CM | POA: Insufficient documentation

## 2014-06-16 DIAGNOSIS — Z720 Tobacco use: Secondary | ICD-10-CM | POA: Insufficient documentation

## 2014-06-16 NOTE — Progress Notes (Signed)
Primary Care Physician: Antonietta Jewel, MD   Carrie [name] is a 63 y.o. female with a h/o hyperlipidemia, hypertension, and ongoing tobacco use who presents for cardiology evaluation.  I have seen her previously in 2010 for palpitations.  She states that these are presently well controlled.  Her concern today is with new chest discomfort and exertional shortness of breath.  She reports that over the past few weeks that she has had progressive symptoms of chest tightness with exertion and shortness of breath.  Presently, these symptoms occur with minimal activity.  She recently started a job at [employer] and states that she is very limited by her symptoms.  She continues to smoke and is not interested in smoking cessation.  She also reports excessive fatigue.  She has difficulty sleeping at night due to her chest discomfort.  She does not snore and does not think that she has sleep apnea.  Today, she denies symptoms of palpitations, orthopnea, PND, lower extremity edema, dizziness, presyncope, syncope, or neurologic sequela. The patient is tolerating medications without difficulties and is otherwise without complaint today.   Past Medical History  Diagnosis Date  . Hypertension   . Hyperlipidemia   . Carotid bruit   . Sinus bradycardia   . Palpitations   . Arthritis     DDD lumbar  . Tobacco abuse    Past Surgical History  Procedure Laterality Date  . Heel spur surgery      rt foot  . Cataract extraction, bilateral      cataracts removed from both eyes  . Hip orif w/ capsulotomy      Current Outpatient Prescriptions  Medication Sig Dispense Refill  . lisinopril (PRINIVIL,ZESTRIL) 40 MG tablet Take 1 tablet (40 mg total) by mouth daily.  90 tablet  3  . metoprolol (LOPRESSOR) 50 MG tablet Take 50 mg by mouth 2 (two) times daily.      Marland Kitchen triamterene-hydrochlorothiazide (MAXZIDE) 75-50 MG per tablet Take 1 tablet by mouth daily.      Marland Kitchen aspirin EC 81 MG tablet Take 1 tablet (81 mg total)  by mouth daily.  90 tablet  3  . nitroGLYCERIN (NITROSTAT) 0.4 MG SL tablet Place 1 tablet (0.4 mg total) under the tongue every 5 (five) minutes as needed for chest pain.  90 tablet  3   No current facility-administered medications for this visit.    Allergies  Allergen Reactions  . Morphine Nausea And Vomiting    ONLY IN IV FORM    History   Social History  . Marital Status: Married    Spouse Name: [name]    Number of Children: [name]  . Years of Education: [name]   Occupational History  .  [employer]   Social History Main Topics  . Smoking status: Current Every Day Smoker    Types: Cigarettes  . Smokeless tobacco: Never Used     Comment: 1 pack of cigarettes will last 1.5 days   . Alcohol Use: No  . Drug Use: No  . Sexual Activity: Not on file   Other Topics Concern  . Not on file   Social History Narrative   Marital status: married x 13 years      Children: 2 children; 3 grandchildren.      Lives: with husband, 2 dogs.      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 Hypertension Mother   . Heart disease Mother     CAD; needs pacemaker75  . Hypertension Father   . Heart disease Father 36    pacemaker  . Hyperlipidemia Brother     ROS- All systems are reviewed and negative except as per the HPI above  Physical Exam: Filed Vitals:   06/14/14 1445  BP: 171/83  Pulse: 64  Height: 5' 5.5" (1.664 m)  Weight: 176 lb (79.833 kg)    GEN- The patient is well appearing, alert and oriented x 3 today.   Head- normocephalic, atraumatic Eyes-  Sclera clear, conjunctiva pink Ears- hearing intact Oropharynx- clear Neck- supple,  Lungs- Clear to ausculation bilaterally, normal work of breathing Heart- Regular rate and rhythm, no murmurs, rubs or gallops, PMI not laterally displaced GI- soft, NT, ND, +  BS Extremities- no clubbing, cyanosis, or edema MS- no significant deformity or atrophy Skin- no rash or lesion Psych- euthymic mood, full affect Neuro- strength and sensation are intact  EKG today reveals sinus rhythm 64 bpm, there is mild st segment depression in II and VF  Assessment and Plan:  1. Exertional chest discomfort and shortness of breath These symptoms are very worrisome for unstable angina. She has multiple CAD risk factors including sedentary lifestyle, HTN, HL, and ongoing tobacco abuse.  I have strongly advised admission for catheterization.  She states that she has recently started a new job at [employer] and cannot proceed as advised.  She will try to find time in her schedule to arrange for this procedure in the next few weeks. I have advised ASA 81 mg daily and sl NTG prn today.  She should call EMS and present to the ER immediately for refractory or progressive symptoms.    2. HTN/ hypertensive cardiovascular disease Above goal I have increased lisinopril to 40mg  daily today I think that I am limited by bradycardia presently in beta blocker titration Could consider addition of amlodipine for anginal symptoms and blood pressure control if remains elevated  3. Tobacco I have strongly advised tobacco cessation.  She is not ready to quit at this time  Proceed with cath  Follow-up with Richardson Dopp in 4 weeks

## 2014-06-17 ENCOUNTER — Telehealth: Payer: Self-pay | Admitting: *Deleted

## 2014-06-17 NOTE — Telephone Encounter (Signed)
Spoke with patient and she has not spoken to her manager yet to get a time that she is able to be off to have her heart cath.  She is hopeful to speak to her manager this week and get back in touch with me later in the week.  I explained the importance of this and the need to schedule as soon as she is able

## 2014-06-18 ENCOUNTER — Telehealth: Payer: Self-pay | Admitting: Internal Medicine

## 2014-06-18 ENCOUNTER — Encounter (HOSPITAL_COMMUNITY): Payer: Self-pay | Admitting: Pharmacy Technician

## 2014-06-18 DIAGNOSIS — R079 Chest pain, unspecified: Secondary | ICD-10-CM

## 2014-06-18 NOTE — Telephone Encounter (Signed)
New message      Have her procedure been scheduled at [facility]?

## 2014-06-18 NOTE — Telephone Encounter (Signed)
Follow up      Pt is available for her procedure this thurs----want the nurse to know

## 2014-06-18 NOTE — Telephone Encounter (Signed)
Spoke with patient and she is scheduled for 06/20/14 at 12:00.  [facility].  NPO after midnight and will come in tomorrow for her labs

## 2014-06-19 ENCOUNTER — Other Ambulatory Visit (INDEPENDENT_AMBULATORY_CARE_PROVIDER_SITE_OTHER): Payer: Medicare Other

## 2014-06-19 ENCOUNTER — Encounter: Payer: Self-pay | Admitting: *Deleted

## 2014-06-19 ENCOUNTER — Other Ambulatory Visit: Payer: Self-pay | Admitting: *Deleted

## 2014-06-19 DIAGNOSIS — R079 Chest pain, unspecified: Secondary | ICD-10-CM

## 2014-06-19 DIAGNOSIS — R0789 Other chest pain: Secondary | ICD-10-CM

## 2014-06-19 LAB — BASIC METABOLIC PANEL
BUN: 26 mg/dL — ABNORMAL HIGH (ref 6–23)
CHLORIDE: 107 meq/L (ref 96–112)
CO2: 27 mEq/L (ref 19–32)
Calcium: 9.4 mg/dL (ref 8.4–10.5)
Creatinine, Ser: 1.5 mg/dL — ABNORMAL HIGH (ref 0.4–1.2)
GFR: 36.45 mL/min — AB (ref >60.00)
Glucose, Bld: 95 mg/dL (ref 70–99)
POTASSIUM: 4.2 meq/L (ref 3.5–5.1)
Sodium: 139 mEq/L (ref 135–145)

## 2014-06-19 LAB — CBC
HEMATOCRIT: 39.7 % (ref 36.0–46.0)
HEMOGLOBIN: 13.2 g/dL (ref 12.0–15.0)
MCHC: 33.3 g/dL (ref 30.0–36.0)
MCV: 91.6 fl (ref 78.0–100.0)
Platelets: 214 10*3/uL (ref 150.0–400.0)
RBC: 4.34 Mil/uL (ref 3.87–5.11)
RDW: 14.7 % (ref 11.5–15.5)
WBC: 11.3 10*3/uL — AB (ref 4.0–10.5)

## 2014-06-19 LAB — PROTIME-INR
INR: 1 ratio (ref 0.8–1.0)
Prothrombin Time: 11.5 s (ref 9.6–13.1)

## 2014-06-20 ENCOUNTER — Other Ambulatory Visit: Payer: Self-pay | Admitting: *Deleted

## 2014-06-20 ENCOUNTER — Inpatient Hospital Stay (HOSPITAL_COMMUNITY)
Admission: RE | Admit: 2014-06-20 | Discharge: 2014-07-06 | DRG: 217 | Disposition: A | Discharge status: Home Health | Payer: Medicare Other | Source: Ambulatory Visit | Attending: Cardiothoracic Surgery | Admitting: Cardiothoracic Surgery

## 2014-06-20 ENCOUNTER — Encounter (HOSPITAL_COMMUNITY): Payer: Self-pay | Admitting: General Practice

## 2014-06-20 ENCOUNTER — Encounter (HOSPITAL_COMMUNITY)
Admission: RE | Disposition: A | Discharge status: Home Health | Payer: Self-pay | Source: Ambulatory Visit | Attending: Internal Medicine

## 2014-06-20 DIAGNOSIS — Z8249 Family history of ischemic heart disease and other diseases of the circulatory system: Secondary | ICD-10-CM | POA: Diagnosis not present

## 2014-06-20 DIAGNOSIS — F17200 Nicotine dependence, unspecified, uncomplicated: Secondary | ICD-10-CM | POA: Diagnosis present

## 2014-06-20 DIAGNOSIS — D62 Acute posthemorrhagic anemia: Secondary | ICD-10-CM | POA: Diagnosis not present

## 2014-06-20 DIAGNOSIS — R112 Nausea with vomiting, unspecified: Secondary | ICD-10-CM | POA: Diagnosis not present

## 2014-06-20 DIAGNOSIS — R040 Epistaxis: Secondary | ICD-10-CM | POA: Diagnosis not present

## 2014-06-20 DIAGNOSIS — E669 Obesity, unspecified: Secondary | ICD-10-CM | POA: Diagnosis present

## 2014-06-20 DIAGNOSIS — D696 Thrombocytopenia, unspecified: Secondary | ICD-10-CM | POA: Diagnosis present

## 2014-06-20 DIAGNOSIS — Z6831 Body mass index (BMI) 31.0-31.9, adult: Secondary | ICD-10-CM | POA: Diagnosis not present

## 2014-06-20 DIAGNOSIS — Z951 Presence of aortocoronary bypass graft: Secondary | ICD-10-CM

## 2014-06-20 DIAGNOSIS — R0789 Other chest pain: Secondary | ICD-10-CM | POA: Diagnosis present

## 2014-06-20 DIAGNOSIS — E785 Hyperlipidemia, unspecified: Secondary | ICD-10-CM | POA: Diagnosis present

## 2014-06-20 DIAGNOSIS — I498 Other specified cardiac arrhythmias: Secondary | ICD-10-CM | POA: Diagnosis present

## 2014-06-20 DIAGNOSIS — M1610 Unilateral primary osteoarthritis, unspecified hip: Secondary | ICD-10-CM | POA: Diagnosis present

## 2014-06-20 DIAGNOSIS — I359 Nonrheumatic aortic valve disorder, unspecified: Secondary | ICD-10-CM

## 2014-06-20 DIAGNOSIS — I200 Unstable angina: Secondary | ICD-10-CM | POA: Diagnosis present

## 2014-06-20 DIAGNOSIS — M264 Malocclusion, unspecified: Secondary | ICD-10-CM | POA: Diagnosis present

## 2014-06-20 DIAGNOSIS — Z952 Presence of prosthetic heart valve: Secondary | ICD-10-CM

## 2014-06-20 DIAGNOSIS — E7800 Pure hypercholesterolemia, unspecified: Secondary | ICD-10-CM | POA: Diagnosis present

## 2014-06-20 DIAGNOSIS — I119 Hypertensive heart disease without heart failure: Secondary | ICD-10-CM | POA: Diagnosis present

## 2014-06-20 DIAGNOSIS — Z9849 Cataract extraction status, unspecified eye: Secondary | ICD-10-CM | POA: Diagnosis not present

## 2014-06-20 DIAGNOSIS — G8929 Other chronic pain: Secondary | ICD-10-CM | POA: Diagnosis present

## 2014-06-20 DIAGNOSIS — Z01810 Encounter for preprocedural cardiovascular examination: Secondary | ICD-10-CM

## 2014-06-20 DIAGNOSIS — Z7982 Long term (current) use of aspirin: Secondary | ICD-10-CM | POA: Diagnosis not present

## 2014-06-20 DIAGNOSIS — R339 Retention of urine, unspecified: Secondary | ICD-10-CM | POA: Diagnosis not present

## 2014-06-20 DIAGNOSIS — I2510 Atherosclerotic heart disease of native coronary artery without angina pectoris: Secondary | ICD-10-CM | POA: Diagnosis present

## 2014-06-20 DIAGNOSIS — I25111 Atherosclerotic heart disease of native coronary artery with angina pectoris with documented spasm: Secondary | ICD-10-CM

## 2014-06-20 DIAGNOSIS — K083 Retained dental root: Secondary | ICD-10-CM | POA: Diagnosis present

## 2014-06-20 DIAGNOSIS — K029 Dental caries, unspecified: Secondary | ICD-10-CM | POA: Diagnosis present

## 2014-06-20 DIAGNOSIS — I350 Nonrheumatic aortic (valve) stenosis: Secondary | ICD-10-CM | POA: Diagnosis present

## 2014-06-20 DIAGNOSIS — K045 Chronic apical periodontitis: Secondary | ICD-10-CM | POA: Diagnosis present

## 2014-06-20 DIAGNOSIS — I4891 Unspecified atrial fibrillation: Secondary | ICD-10-CM | POA: Diagnosis not present

## 2014-06-20 DIAGNOSIS — K036 Deposits [accretions] on teeth: Secondary | ICD-10-CM | POA: Diagnosis present

## 2014-06-20 DIAGNOSIS — E8779 Other fluid overload: Secondary | ICD-10-CM | POA: Diagnosis not present

## 2014-06-20 DIAGNOSIS — E039 Hypothyroidism, unspecified: Secondary | ICD-10-CM | POA: Diagnosis present

## 2014-06-20 DIAGNOSIS — Z885 Allergy status to narcotic agent status: Secondary | ICD-10-CM | POA: Diagnosis not present

## 2014-06-20 DIAGNOSIS — M169 Osteoarthritis of hip, unspecified: Secondary | ICD-10-CM | POA: Diagnosis present

## 2014-06-20 DIAGNOSIS — I25119 Atherosclerotic heart disease of native coronary artery with unspecified angina pectoris: Secondary | ICD-10-CM

## 2014-06-20 DIAGNOSIS — Z79899 Other long term (current) drug therapy: Secondary | ICD-10-CM | POA: Diagnosis not present

## 2014-06-20 HISTORY — PX: LEFT HEART CATHETERIZATION WITH CORONARY ANGIOGRAM: SHX5451

## 2014-06-20 HISTORY — DX: Atherosclerotic heart disease of native coronary artery without angina pectoris: I25.10

## 2014-06-20 HISTORY — DX: Low back pain, unspecified: M54.50

## 2014-06-20 HISTORY — DX: Other intervertebral disc degeneration, lumbar region: M51.36

## 2014-06-20 HISTORY — DX: Other chronic pain: G89.29

## 2014-06-20 HISTORY — DX: Other intervertebral disc degeneration, lumbar region without mention of lumbar back pain or lower extremity pain: M51.369

## 2014-06-20 HISTORY — DX: Low back pain: M54.5

## 2014-06-20 HISTORY — PX: CARDIAC CATHETERIZATION: SHX172

## 2014-06-20 HISTORY — DX: Cardiac murmur, unspecified: R01.1

## 2014-06-20 LAB — POCT I-STAT 3, ART BLOOD GAS (G3+)
Acid-base deficit: 3 mmol/L — ABNORMAL HIGH (ref 0.0–2.0)
Bicarbonate: 21.9 mEq/L (ref 20.0–24.0)
O2 SAT: 93 %
PCO2 ART: 36.7 mmHg (ref 35.0–45.0)
PH ART: 7.383 (ref 7.350–7.450)
TCO2: 23 mmol/L (ref 0–100)
pO2, Arterial: 66 mmHg — ABNORMAL LOW (ref 80.0–100.0)

## 2014-06-20 LAB — POCT I-STAT 3, VENOUS BLOOD GAS (G3P V)
Acid-base deficit: 2 mmol/L (ref 0.0–2.0)
BICARBONATE: 23.8 meq/L (ref 20.0–24.0)
O2 SAT: 58 %
PO2 VEN: 31 mmHg (ref 30.0–45.0)
TCO2: 25 mmol/L (ref 0–100)
pCO2, Ven: 41.7 mmHg — ABNORMAL LOW (ref 45.0–50.0)
pH, Ven: 7.364 — ABNORMAL HIGH (ref 7.250–7.300)

## 2014-06-20 SURGERY — LEFT HEART CATHETERIZATION WITH CORONARY ANGIOGRAM
Anesthesia: LOCAL

## 2014-06-20 MED ORDER — SODIUM CHLORIDE 0.9 % IV SOLN: INTRAVENOUS | Status: DC

## 2014-06-20 MED ORDER — ASPIRIN 81 MG PO CHEW: 81.0000 mg | CHEWABLE_TABLET | ORAL | Status: DC

## 2014-06-20 MED ORDER — PNEUMOCOCCAL VAC POLYVALENT 25 MCG/0.5ML IJ INJ
0.5000 mL | INJECTION | INTRAMUSCULAR | Status: DC
  Filled 2014-06-20: qty 0.5

## 2014-06-20 MED ORDER — VERAPAMIL HCL 2.5 MG/ML IV SOLN
INTRAVENOUS | Status: AC
  Filled 2014-06-20: qty 2

## 2014-06-20 MED ORDER — NICOTINE 14 MG/24HR TD PT24: 14.0000 mg | MEDICATED_PATCH | Freq: Every day | TRANSDERMAL | Status: DC

## 2014-06-20 MED ORDER — MIDAZOLAM HCL 2 MG/2ML IJ SOLN
INTRAMUSCULAR | Status: AC
  Filled 2014-06-20: qty 2

## 2014-06-20 MED ORDER — METOPROLOL TARTRATE 50 MG PO TABS
50.0000 mg | ORAL_TABLET | Freq: Two times a day (BID) | ORAL | Status: DC
  Administered 2014-06-20 – 2014-06-27 (×15): 50 mg via ORAL
  Filled 2014-06-20 (×17): qty 1

## 2014-06-20 MED ORDER — ONDANSETRON HCL 4 MG/2ML IJ SOLN
4.0000 mg | Freq: Four times a day (QID) | INTRAMUSCULAR | Status: DC | PRN
  Administered 2014-06-25 – 2014-06-27 (×2): 4 mg via INTRAVENOUS
  Filled 2014-06-20 (×2): qty 2

## 2014-06-20 MED ORDER — LISINOPRIL 40 MG PO TABS
40.0000 mg | ORAL_TABLET | Freq: Every day | ORAL | Status: DC
  Administered 2014-06-21 – 2014-06-26 (×5): 40 mg via ORAL
  Filled 2014-06-20 (×7): qty 1

## 2014-06-20 MED ORDER — HYDROCODONE-ACETAMINOPHEN 5-325 MG PO TABS
ORAL_TABLET | ORAL | Status: AC
  Filled 2014-06-20: qty 1

## 2014-06-20 MED ORDER — SODIUM CHLORIDE 0.9 % IJ SOLN: 3.0000 mL | Freq: Two times a day (BID) | INTRAMUSCULAR | Status: DC

## 2014-06-20 MED ORDER — BUDESONIDE-FORMOTEROL FUMARATE 160-4.5 MCG/ACT IN AERO
2.0000 | INHALATION_SPRAY | Freq: Two times a day (BID) | RESPIRATORY_TRACT | Status: DC
  Administered 2014-06-20 – 2014-06-25 (×9): 2 via RESPIRATORY_TRACT
  Filled 2014-06-20: qty 6

## 2014-06-20 MED ORDER — SODIUM CHLORIDE 0.9 % IV SOLN: 1.0000 mL/kg/h | INTRAVENOUS | Status: AC

## 2014-06-20 MED ORDER — HYDROCODONE-ACETAMINOPHEN 5-325 MG PO TABS
1.0000 | ORAL_TABLET | Freq: Once | ORAL | Status: AC
  Administered 2014-06-20: 1 via ORAL

## 2014-06-20 MED ORDER — FENTANYL CITRATE 0.05 MG/ML IJ SOLN
INTRAMUSCULAR | Status: AC
  Filled 2014-06-20: qty 2

## 2014-06-20 MED ORDER — HEPARIN (PORCINE) IN NACL 2-0.9 UNIT/ML-% IJ SOLN
INTRAMUSCULAR | Status: AC
  Filled 2014-06-20: qty 1000

## 2014-06-20 MED ORDER — HEPARIN (PORCINE) IN NACL 100-0.45 UNIT/ML-% IJ SOLN
1000.0000 [IU]/h | INTRAMUSCULAR | Status: DC
  Administered 2014-06-21: 1000 [IU]/h via INTRAVENOUS
  Filled 2014-06-20 (×3): qty 250

## 2014-06-20 MED ORDER — SODIUM CHLORIDE 0.9 % IJ SOLN: 3.0000 mL | INTRAMUSCULAR | Status: DC | PRN

## 2014-06-20 MED ORDER — LIDOCAINE HCL (PF) 1 % IJ SOLN
INTRAMUSCULAR | Status: AC
  Filled 2014-06-20: qty 30

## 2014-06-20 MED ORDER — ATORVASTATIN CALCIUM 80 MG PO TABS
80.0000 mg | ORAL_TABLET | Freq: Every day | ORAL | Status: DC
  Filled 2014-06-20 (×2): qty 1

## 2014-06-20 MED ORDER — HYDROCODONE-ACETAMINOPHEN 10-325 MG PO TABS
1.0000 | ORAL_TABLET | Freq: Four times a day (QID) | ORAL | Status: DC | PRN
  Administered 2014-06-20 – 2014-06-26 (×16): 1 via ORAL
  Filled 2014-06-20 (×16): qty 1

## 2014-06-20 MED ORDER — NITROGLYCERIN 0.4 MG SL SUBL: 0.4000 mg | SUBLINGUAL_TABLET | SUBLINGUAL | Status: DC | PRN

## 2014-06-20 MED ORDER — NITROGLYCERIN 1 MG/10 ML FOR IR/CATH LAB
INTRA_ARTERIAL | Status: AC
  Filled 2014-06-20: qty 10

## 2014-06-20 MED ORDER — ACETAMINOPHEN 325 MG PO TABS
650.0000 mg | ORAL_TABLET | ORAL | Status: DC | PRN
  Filled 2014-06-20: qty 2

## 2014-06-20 MED ORDER — ASPIRIN EC 81 MG PO TBEC
81.0000 mg | DELAYED_RELEASE_TABLET | Freq: Every day | ORAL | Status: DC
  Administered 2014-06-21 – 2014-06-27 (×6): 81 mg via ORAL
  Filled 2014-06-20 (×8): qty 1

## 2014-06-20 MED ORDER — SODIUM CHLORIDE 0.9 % IV SOLN: 250.0000 mL | INTRAVENOUS | Status: DC | PRN

## 2014-06-20 NOTE — Interval H&P Note (Signed)
History and Physical Interval Note:  06/20/2014 2:29 PM  Carrie [name]  has presented today for surgery, with the diagnosis of cp  The various methods of treatment have been discussed with the patient and family. After consideration of risks, benefits and other options for treatment, the patient has consented to  Procedure(s): LEFT HEART CATHETERIZATION WITH CORONARY ANGIOGRAM ([name]) as a surgical intervention .  The patient's history has been reviewed, patient examined, no change in status, stable for surgery.  I have reviewed the patient's chart and labs.  Questions were answered to the patient's satisfaction.   Cath Lab Visit (complete for each Cath Lab visit)  Clinical Evaluation Leading to the Procedure:   ACS: Yes.    Non-ACS:    Anginal Classification: CCS III  Anti-ischemic medical therapy: Minimal Therapy (1 class of medications)  Non-Invasive Test Results: No non-invasive testing performed  Prior CABG: No previous CABG        Collier Salina [facility] 06/20/2014 2:29 PM

## 2014-06-20 NOTE — CV Procedure (Signed)
    Cardiac Catheterization Procedure Note  Name: Carrie [name] MRN: [id number] DOB: [birth year]  Procedure: Right Heart Cath, Left Heart Cath, Selective Coronary Angiography, LV angiography  Indication: 63 yo WF with history of moderate AS by Echo in 2010. She also has a history of tobacco abuse, HTN, and hyperlipidemia. She presents with unstable angina.   Procedural Details: The right wrist was prepped, draped, and anesthetized with 1% lidocaine. Using the modified Seldinger technique a 6 Fr slender sheath was placed in the right radial artery and a 5 French sheath was placed in the right brachial vein. A Swan-Ganz catheter was used for the right heart catheterization. Standard protocol was followed for recording of right heart pressures and sampling of oxygen saturations. Fick cardiac output was calculated. Standard Judkins catheters were used for selective coronary angiography and left ventriculography. There were no immediate procedural complications. The patient was transferred to the post catheterization recovery area for further monitoring.  Procedural Findings: Hemodynamics RA 14/10 mean 8 mm Hg RV 35/6/10 mm Hg PA 31/12 mean 20 mm Hg PCWP 17/14 mean 13 mm Hg LV 148/65 mean 95 mm Hg AO 216/17 mm Hg  AV mean gradient 45 mmHg AV area 0.7 cm squared, index 0.37  Oxygen saturations: PA 58% AO 93%  Cardiac Output (Fick) 4.02 L/min  Cardiac Index (Fick) 2.112 L/min/meter squared   Coronary angiography: Coronary dominance: right  Left mainstem: 40% ostial/proximal  Left anterior descending (LAD): 60% mid LAD stenosis.   Left circumflex (LCx): 40% mid LCx post OM1.  Right coronary artery (RCA): 95% ostial lesion followed by 95% lesion in the proximal to mid RCA.  Left ventriculography: Left ventricular systolic function is normal, LVEF is estimated at 70%, there is no significant mitral regurgitation   Final Conclusions:   1. Severe 2 vessel obstructive CAD. 2. Severe  aortic stenosis. 3. Normal LV function. 4. Normal right heart pressures.  Recommendations: patient has unstable angina. Will admit. Heparinize. Obtain Echo. CT surgery consult for AVR and CABG.   Arjen Deringer [place], [place] 06/20/2014, 3:23 PM

## 2014-06-20 NOTE — Progress Notes (Signed)
Pt stated that she has back pain she rated it as a 7 out of 10.  Orders were given for 1 vicodin PO.  I gave her 1 Vicodin.  Will continue to [facility] closely

## 2014-06-20 NOTE — H&P (View-Only) (Signed)
Primary Care Physician: Antonietta Jewel, MD   Carrie [name] is a 63 y.o. female with a h/o hyperlipidemia, hypertension, and ongoing tobacco use who presents for cardiology evaluation.  I have seen her previously in 2010 for palpitations.  She states that these are presently well controlled.  Her concern today is with new chest discomfort and exertional shortness of breath.  She reports that over the past few weeks that she has had progressive symptoms of chest tightness with exertion and shortness of breath.  Presently, these symptoms occur with minimal activity.  She recently started a job at [employer] and states that she is very limited by her symptoms.  She continues to smoke and is not interested in smoking cessation.  She also reports excessive fatigue.  She has difficulty sleeping at night due to her chest discomfort.  She does not snore and does not think that she has sleep apnea.  Today, she denies symptoms of palpitations, orthopnea, PND, lower extremity edema, dizziness, presyncope, syncope, or neurologic sequela. The patient is tolerating medications without difficulties and is otherwise without complaint today.   Past Medical History  Diagnosis Date  . Hypertension   . Hyperlipidemia   . Carotid bruit   . Sinus bradycardia   . Palpitations   . Arthritis     DDD lumbar  . Tobacco abuse    Past Surgical History  Procedure Laterality Date  . Heel spur surgery      rt foot  . Cataract extraction, bilateral      cataracts removed from both eyes  . Hip orif w/ capsulotomy      Current Outpatient Prescriptions  Medication Sig Dispense Refill  . lisinopril (PRINIVIL,ZESTRIL) 40 MG tablet Take 1 tablet (40 mg total) by mouth daily.  90 tablet  3  . metoprolol (LOPRESSOR) 50 MG tablet Take 50 mg by mouth 2 (two) times daily.      Marland Kitchen triamterene-hydrochlorothiazide (MAXZIDE) 75-50 MG per tablet Take 1 tablet by mouth daily.      Marland Kitchen aspirin EC 81 MG tablet Take 1 tablet (81 mg total)  by mouth daily.  90 tablet  3  . nitroGLYCERIN (NITROSTAT) 0.4 MG SL tablet Place 1 tablet (0.4 mg total) under the tongue every 5 (five) minutes as needed for chest pain.  90 tablet  3   No current facility-administered medications for this visit.    Allergies  Allergen Reactions  . Morphine Nausea And Vomiting    ONLY IN IV FORM    History   Social History  . Marital Status: Married    Spouse Name: [name]    Number of Children: [name]  . Years of Education: [name]   Occupational History  .  [employer]   Social History Main Topics  . Smoking status: Current Every Day Smoker    Types: Cigarettes  . Smokeless tobacco: Never Used     Comment: 1 pack of cigarettes will last 1.5 days   . Alcohol Use: No  . Drug Use: No  . Sexual Activity: Not on file   Other Topics Concern  . Not on file   Social History Narrative   Marital status: married x 13 years      Children: 2 children; 3 grandchildren.      Lives: with husband, 2 dogs.      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 Hypertension Mother   . Heart disease Mother     CAD; needs pacemaker75  . Hypertension Father   . Heart disease Father 71    pacemaker  . Hyperlipidemia Brother     ROS- All systems are reviewed and negative except as per the HPI above  Physical Exam: Filed Vitals:   06/14/14 1445  BP: 171/83  Pulse: 64  Height: 5' 5.5" (1.664 m)  Weight: 176 lb (79.833 kg)    GEN- The patient is well appearing, alert and oriented x 3 today.   Head- normocephalic, atraumatic Eyes-  Sclera clear, conjunctiva pink Ears- hearing intact Oropharynx- clear Neck- supple,  Lungs- Clear to ausculation bilaterally, normal work of breathing Heart- Regular rate and rhythm, no murmurs, rubs or gallops, PMI not laterally displaced GI- soft, NT, ND, +  BS Extremities- no clubbing, cyanosis, or edema MS- no significant deformity or atrophy Skin- no rash or lesion Psych- euthymic mood, full affect Neuro- strength and sensation are intact  EKG today reveals sinus rhythm 64 bpm, there is mild st segment depression in II and VF  Assessment and Plan:  1. Exertional chest discomfort and shortness of breath These symptoms are very worrisome for unstable angina. She has multiple CAD risk factors including sedentary lifestyle, HTN, HL, and ongoing tobacco abuse.  I have strongly advised admission for catheterization.  She states that she has recently started a new job at [employer] and cannot proceed as advised.  She will try to find time in her schedule to arrange for this procedure in the next few weeks. I have advised ASA 81 mg daily and sl NTG prn today.  She should call EMS and present to the ER immediately for refractory or progressive symptoms.    2. HTN/ hypertensive cardiovascular disease Above goal I have increased lisinopril to 40mg  daily today I think that I am limited by bradycardia presently in beta blocker titration Could consider addition of amlodipine for anginal symptoms and blood pressure control if remains elevated  3. Tobacco I have strongly advised tobacco cessation.  She is not ready to quit at this time  Proceed with cath  Follow-up with Richardson Dopp in 4 weeks

## 2014-06-20 NOTE — Progress Notes (Addendum)
Pre-op Cardiac Surgery  Carotid Findings:   Right ICA:  40-59% internal carotid artery stenosis. Left ICA:  60-79% internal carotid artery stenosis.  Bilateral:  Vertebral artery flow is antegrade.          Christy Little, RCS 06-20-14  Upper Extremity Right Left  Brachial Pressures 136 Triphasic  116 Triphasic   Radial Waveforms Triphasic  Triphasic   Ulnar Waveforms Triphasic  Triphasic   Palmar Arch (Allen's Test) Obliterates with radial compression, normal with ulnar compression. Obliterates with radial compression, normal with ulnar compression.    Ralene Cork, RVT 06/21/2014 5:04 PM

## 2014-06-20 NOTE — Progress Notes (Signed)
Right radial TR band removed without complications.  2X2 and tegaderm applied.  Radial site care instructions given to patient.  Will continue to monitor.  Carrie [name]

## 2014-06-20 NOTE — Progress Notes (Signed)
  Echocardiogram 2D Echocardiogram has been performed.  Carrie [name] FRANCES 06/20/2014, 5:42 PM

## 2014-06-20 NOTE — Progress Notes (Signed)
ANTICOAGULATION CONSULT NOTE - Initial Consult  Pharmacy Consult for Heparin Indication: 2V CAD  Allergies  Allergen Reactions  . Morphine Nausea And Vomiting    ONLY IN IV FORM    Patient Measurements: Height: 5' 5.5" (166.4 cm) Weight: 176 lb (79.833 kg) IBW/kg (Calculated) : 58.15 Heparin Dosing Weight: 74 kg  Vital Signs: Temp: 97.7 F (36.5 C) (07/30 1043) Temp src: Oral (07/30 1043) BP: 146/67 mmHg (07/30 1618) Pulse Rate: 58 (07/30 1427)  Labs:  Recent Labs  06/19/14 1052  HGB 13.2  HCT 39.7  PLT 214.0  LABPROT 11.5  INR 1.0  CREATININE 1.5*    Estimated Creatinine Clearance: 39.4 ml/min (by C-G formula based on Cr of 1.5).   Medical History: Past Medical History  Diagnosis Date  . Hypertension   . Hyperlipidemia   . Carotid bruit   . Sinus bradycardia   . Palpitations   . Arthritis     DDD lumbar  . Tobacco abuse     Medications:  Prescriptions prior to admission  Medication Sig Dispense Refill  . aspirin EC 81 MG tablet Take 1 tablet (81 mg total) by mouth daily.  90 tablet  3  . lisinopril (PRINIVIL,ZESTRIL) 40 MG tablet Take 1 tablet (40 mg total) by mouth daily.  90 tablet  3  . metoprolol (LOPRESSOR) 50 MG tablet Take 50 mg by mouth 2 (two) times daily.      Marland Kitchen triamterene-hydrochlorothiazide (MAXZIDE) 75-50 MG per tablet Take 1 tablet by mouth daily.      . nitroGLYCERIN (NITROSTAT) 0.4 MG SL tablet Place 1 tablet (0.4 mg total) under the tongue every 5 (five) minutes as needed for chest pain.  90 tablet  3    Assessment: 63 y.o. female s/p cath today which showed 2V CAD and AS. To consult CT surgery for AVR/CABG. To start heparin 8 hour post sheath removal. Sheath pulled ~1600. CBC ok at baseline.  Goal of Therapy:  Heparin level 0.3-0.7 units/ml Monitor platelets by anticoagulation protocol: Yes   Plan:  1. At midnight, start Heparin gtt at 1000 units/hr. No bolus. 2. Will check heparin level 6 hr post heparin start 3. Will f/u  daily heparin level and CBC   Sherlon Handing, PharmD, BCPS Clinical pharmacist, pager [phone] 06/20/2014,4:23 PM

## 2014-06-21 ENCOUNTER — Inpatient Hospital Stay (HOSPITAL_COMMUNITY): Payer: Medicare Other

## 2014-06-21 ENCOUNTER — Encounter (HOSPITAL_COMMUNITY): Payer: Self-pay | Admitting: Dentistry

## 2014-06-21 DIAGNOSIS — I359 Nonrheumatic aortic valve disorder, unspecified: Secondary | ICD-10-CM

## 2014-06-21 DIAGNOSIS — I209 Angina pectoris, unspecified: Secondary | ICD-10-CM

## 2014-06-21 DIAGNOSIS — R0789 Other chest pain: Secondary | ICD-10-CM

## 2014-06-21 DIAGNOSIS — I2510 Atherosclerotic heart disease of native coronary artery without angina pectoris: Principal | ICD-10-CM

## 2014-06-21 DIAGNOSIS — Z7901 Long term (current) use of anticoagulants: Secondary | ICD-10-CM

## 2014-06-21 DIAGNOSIS — Z01810 Encounter for preprocedural cardiovascular examination: Secondary | ICD-10-CM

## 2014-06-21 LAB — SURGICAL PCR SCREEN
MRSA, PCR: NEGATIVE
Staphylococcus aureus: POSITIVE — AB

## 2014-06-21 LAB — HEPARIN LEVEL (UNFRACTIONATED)
HEPARIN UNFRACTIONATED: 0.31 [IU]/mL (ref 0.30–0.70)
Heparin Unfractionated: 0.29 IU/mL — ABNORMAL LOW (ref 0.30–0.70)
Heparin Unfractionated: 0.56 IU/mL (ref 0.30–0.70)

## 2014-06-21 MED ORDER — ATORVASTATIN CALCIUM 80 MG PO TABS
80.0000 mg | ORAL_TABLET | Freq: Every day | ORAL | Status: DC
  Administered 2014-06-21 – 2014-06-27 (×7): 80 mg via ORAL
  Filled 2014-06-21 (×8): qty 1

## 2014-06-21 MED ORDER — CHLORHEXIDINE GLUCONATE CLOTH 2 % EX PADS
6.0000 | MEDICATED_PAD | Freq: Every day | CUTANEOUS | Status: AC
  Administered 2014-06-21 – 2014-06-23 (×3): 6 via TOPICAL

## 2014-06-21 MED ORDER — MUPIROCIN 2 % EX OINT
1.0000 "application " | TOPICAL_OINTMENT | Freq: Two times a day (BID) | CUTANEOUS | Status: AC
  Administered 2014-06-21 – 2014-06-25 (×10): 1 via NASAL
  Filled 2014-06-21: qty 22

## 2014-06-21 MED ORDER — MUPIROCIN 2 % EX OINT: 1.0000 "application " | TOPICAL_OINTMENT | Freq: Two times a day (BID) | CUTANEOUS | Status: DC

## 2014-06-21 MED ORDER — HEPARIN (PORCINE) IN NACL 100-0.45 UNIT/ML-% IJ SOLN
1200.0000 [IU]/h | INTRAMUSCULAR | Status: DC
  Administered 2014-06-21 – 2014-06-23 (×3): 1200 [IU]/h via INTRAVENOUS
  Filled 2014-06-21 (×6): qty 250

## 2014-06-21 MED ORDER — ALBUTEROL SULFATE (2.5 MG/3ML) 0.083% IN NEBU
2.5000 mg | INHALATION_SOLUTION | Freq: Once | RESPIRATORY_TRACT | Status: AC
  Administered 2014-06-21: 2.5 mg via RESPIRATORY_TRACT

## 2014-06-21 NOTE — Progress Notes (Signed)
ANTICOAGULATION CONSULT NOTE - Follow Up Consult  Pharmacy Consult for heparin Indication: 2V CAD  Allergies  Allergen Reactions  . Morphine Nausea And Vomiting    ONLY IN IV FORM    Patient Measurements: Height: 5' 5.5" (166.4 cm) Weight: 175 lb 4.8 oz (79.516 kg) IBW/kg (Calculated) : 58.15 Heparin Dosing Weight: 74.7kg  Vital Signs: Temp: 97.9 F (36.6 C) (07/31 1415) Temp src: Oral (07/31 1415) BP: 131/64 mmHg (07/31 1415) Pulse Rate: 51 (07/31 1415)  Labs:  Recent Labs  06/19/14 1052 06/21/14 0903 06/21/14 1432  HGB 13.2  --   --   HCT 39.7  --   --   PLT 214.0  --   --   LABPROT 11.5  --   --   INR 1.0  --   --   HEPARINUNFRC  --  0.31 0.29*  CREATININE 1.5*  --   --     Estimated Creatinine Clearance: 39.4 ml/min (by C-G formula based on Cr of 1.5).   Medications:  Prescriptions prior to admission  Medication Sig Dispense Refill  . aspirin EC 81 MG tablet Take 1 tablet (81 mg total) by mouth daily.  90 tablet  3  . lisinopril (PRINIVIL,ZESTRIL) 40 MG tablet Take 1 tablet (40 mg total) by mouth daily.  90 tablet  3  . metoprolol (LOPRESSOR) 50 MG tablet Take 50 mg by mouth 2 (two) times daily.      Marland Kitchen triamterene-hydrochlorothiazide (MAXZIDE) 75-50 MG per tablet Take 1 tablet by mouth daily.      . nitroGLYCERIN (NITROSTAT) 0.4 MG SL tablet Place 1 tablet (0.4 mg total) under the tongue every 5 (five) minutes as needed for chest pain.  90 tablet  3    Assessment: 63 yo female with severe multivessel coronary disease w/ unstable angina s/p sheath removal 7/30. Plan for CABG next week. Pharmacy consulted to dose heparin. Second HL to confirm therapeutic is slightly subtherapeutic at 0.29. No bleeding noted. Last Hgb/Hct/PLTC WNL.  Goal of Therapy:  Heparin level 0.3-0.7 units/ml Monitor platelets by anticoagulation protocol: Yes   Plan:  - Increase Heparin gtt to 1200 units/hr.  - Check heparin level 6 hr - Monitor for s/sx of bleeding.   Thank you  for allowing pharmacy to be part of this patient's care team  Sundeep Cary M. Breland Elders, Pharm.D Clinical Pharmacy Resident Pager: [phone] 06/21/2014 .3:35 PM

## 2014-06-21 NOTE — Consult Note (Signed)
[address]       [place] [address]             [phone]        Landry D Boys [place] Medical Record #[id number] Date of Birth: 03-27-1951  Referring: No ref. provider found Primary Care: No PCP Per Patient  Chief Complaint: Chest pain, decreased exercise tolerance and shortness of breath  History of Present Illness:     Patient examined, coronary angiogram and 2-D echocardiogram reviewed 63 year old Caucasian female smoker admitted from the cardiology office yesterday after presenting with complaints of unstable angina-exertional upper chest and neck pain and resting chest pain at night-and increasing shortness of breath with exertion. Patient has a history of a cardiac murmur since childhood which until recently has been asymptomatic. She has had palpitations in the past but is in sinus rhythm. There is a positive family history for coronary artery disease. She smokes a pack a day, has hypertension and a healthy lifestyle. She has nonspecific EKG changes the cardiology office.  The patient underwent coronary angiography-she has a high-grade proximal LAD stenosis of 90%, distal RCA 80-90% stenosis with some moderate disease in the distal right circulation, large OM branch without significant disease but the continuation of the circumflex in the AV groove has moderate stenosis of a small vessel. Overall EF appears to be preserved. She has a significant transvalvular gradient with a calculated AVA of 0.7 cm square. Right heart cath pressures are normal and cardiac output is normal. Echocardiogram demonstrates and confirmed severe aortic stenosis with a peak gradient of 80 mm mercury. The annular diameter appears to be 2 20-21 mm. No significant MR or TR. LVH with preserved LV systolic function. RV function appears normal.  The patient is on IV heparin and has been asymptomatic. Carotid Dopplers-left ICA 6080% stenosis, right ICA 40-60% stenosis, asymptomatic ABIs  are pending but she has nonpalpable pedal pulses  The patient states she had two dental extractions last year and was covered with oral antibiotics. She states she sees a dentist more frequently now that she has insurance   Current Activity/ Functional Status: Patient lives with her husband. She works in [employer] at [employer] on [employer]. She has smoked for several years and now states she is quit.   Zubrod Score: At the time of surgery this patient's most appropriate activity status/level should be described as: []     0    Normal activity, no symptoms [x]     1    Restricted in physical strenuous activity but ambulatory, able to do out light work []     2    Ambulatory and capable of self care, unable to do work activities, up and about                 more than 50%  Of the time                            []     3    Only limited self care, in bed greater than 50% of waking hours []     4    Completely disabled, no self care, confined to bed or chair []     5    Moribund  Past Medical History  Diagnosis Date  . Hypertension   . Hyperlipidemia   . Carotid bruit   . Sinus bradycardia   . Palpitations   . Tobacco  abuse   . Heart murmur   . DDD (degenerative disc disease), lumbar   . Chronic lower back pain   . Coronary artery disease     Past Surgical History  Procedure Laterality Date  . Heel spur surgery Right   . Cataract extraction w/ intraocular lens  implant, bilateral Bilateral ~ 02/2014  . Hip orif w/ capsulotomy Left 1980's  . Tonsillectomy  1978  . Cardiac catheterization  06/20/2014  . Abdominal hysterectomy  1970's    History  Smoking status  . Current Every Day Smoker -- 0.75 packs/day for 50 years  . Types: Cigarettes  Smokeless tobacco  . Never Used    Comment: 06/20/2014 "I've been using the e-cigarettes to try to get me off the regular cigarettes"    History  Alcohol Use No    History   Social History  . Marital Status: Married    Spouse Name:  [name]    Number of Children: [name]  . Years of Education: [name]   Occupational History  .  [employer]   Social History Main Topics  . Smoking status: Current Every Day Smoker -- 0.75 packs/day for 50 years    Types: Cigarettes  . Smokeless tobacco: Never Used     Comment: 06/20/2014 "I've been using the e-cigarettes to try to get me off the regular cigarettes"  . Alcohol Use: No  . Drug Use: No  . Sexual Activity: Not Currently   Other Topics Concern  . Not on file   Social History Narrative   Marital status: married x 13 years      Children: 2 children; 3 grandchildren.      Lives: with husband, 2 dogs.      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 Morphine Nausea And Vomiting    ONLY IN IV FORM    Current Facility-Administered Medications  Medication Dose Route Frequency Provider Last Rate Last Dose  . acetaminophen (TYLENOL) tablet 650 mg  650 mg Oral Q4H PRN Peter M Martinique, MD      . aspirin EC tablet 81 mg  81 mg Oral Daily Peter M Martinique, MD   81 mg at 06/21/14 0946  . atorvastatin (LIPITOR) tablet 80 mg  80 mg Oral q1800 Peter M Martinique, MD      . budesonide-formoterol [facility]) 160-4.5 MCG/ACT inhaler 2 puff  2 puff Inhalation BID Ivin Poot, MD   2 puff at 06/21/14 [phone]  . Chlorhexidine Gluconate Cloth 2 % PADS 6 each  6 each Topical Daily Thompson Grayer, MD   6 each at 06/21/14 0950  . heparin ADULT infusion 100 units/mL (25000 units/250 mL)  1,000 Units/hr Intravenous Continuous Juanda Chance Amend, RPH 10 mL/hr at 06/21/14 0059 1,000 Units/hr at 06/21/14 0059  . HYDROcodone-acetaminophen (NORCO) 10-325 MG per tablet 1 tablet  1 tablet Oral Q6H PRN Larey Dresser, MD   1 tablet at 06/21/14 [phone]  . lisinopril (PRINIVIL,ZESTRIL) tablet 40 mg  40 mg Oral Daily Peter M Martinique, MD   40 mg at 06/21/14 0947  . metoprolol  (LOPRESSOR) tablet 50 mg  50 mg Oral BID Peter M Martinique, MD   50 mg at 06/21/14 0955  . mupirocin ointment (BACTROBAN) 2 %  1 application  1 application Nasal BID Thompson Grayer, MD   1 application at 93/57/01 [phone]  . nitroGLYCERIN (NITROSTAT) SL tablet 0.4 mg  0.4 mg Sublingual Q5 min PRN Peter M Martinique, MD      . ondansetron [facility]) injection 4 mg  4 mg Intravenous Q6H PRN Peter M Martinique, MD      . pneumococcal 23 valent vaccine (PNU-IMMUNE) injection 0.5 mL  0.5 mL Intramuscular Tomorrow-1000 Thompson Grayer, MD        Prescriptions prior to admission  Medication Sig Dispense Refill  . aspirin EC 81 MG tablet Take 1 tablet (81 mg total) by mouth daily.  90 tablet  3  . lisinopril (PRINIVIL,ZESTRIL) 40 MG tablet Take 1 tablet (40 mg total) by mouth daily.  90 tablet  3  . metoprolol (LOPRESSOR) 50 MG tablet Take 50 mg by mouth 2 (two) times daily.      Marland Kitchen triamterene-hydrochlorothiazide (MAXZIDE) 75-50 MG per tablet Take 1 tablet by mouth daily.      . nitroGLYCERIN (NITROSTAT) 0.4 MG SL tablet Place 1 tablet (0.4 mg total) under the tongue every 5 (five) minutes as needed for chest pain.  90 tablet  3    Family History  Problem Relation Age of Onset  . Hypertension Mother   . Heart disease Mother     CAD; needs pacemaker75  . Hypertension Father   . Heart disease Father 26    pacemaker  . Hyperlipidemia Brother      Review of Systems:  The patient is right-hand dominant The patient denies any history of significant GI bleeding or contraindication to Coumadin The patient states that narcotics make her feel bad-she had a PCA after her left hip surgery for a traumatic hip fracture She denies any difficulty swallowing or tooth pain. She did have 2 dental extractions from a right-sided molar about one to 2 years ago She denies any thoracic trauma, recent bronchitis or antibiotic requirements. She complains of bilateral foot pain and burning when she sleeps at night    Cardiac Review of  Systems: Y or N  Chest Pain Totoro.Blacker    ]  Resting SOB [  no ] Exertional SOB  [ yes ]  Lamarr Lulas  ]   Pedal Edema [ no  ]    Palpitations Totoro.Blacker  ] Syncope  [ no ]   Presyncope [ no  ]  General Review of Systems: [Y] = yes [  ]=no Constitional: recent weight change [ no ]; anorexia [  ]; fatigue [  ]; nausea [  ]; night sweats [  ]; fever [  ]; or chills [  ]                                                               Dental: poor dentition[ yes ]; Last Dentist visit: 1 year   Eye : blurred vision [  ]; diplopia [   ]; vision changes [  ];  Amaurosis fugax[  ]; Resp: cough [  ];  wheezing[  ];  hemoptysis[  ]; shortness of breath[  ]; paroxysmal nocturnal dyspnea[  ]; dyspnea on exertion[  yes]; or orthopnea[  ];  GI:  gallstones[ no ], vomiting[  ];  dysphagia[  ]; melena[  ];  hematochezia [  ]; heartburn[  ];   Hx of  Colonoscopy[ no ]; GU: kidney stones [  ]; hematuria[  ];   dysuria [  ];  nocturia[  ];  history of     obstruction [  ]; urinary frequency [  ]             Skin: rash, swelling[  ];, hair loss[  ];  peripheral edema[  ];  or itching[  ]; Musculosketetal: myalgias[  ];  joint swelling[  ];  joint erythema[  ];  joint pain[  ];  back pain[ yes-chronic low back pain ];  Heme/Lymph: bruising[  ];  bleeding[ no ];  anemia[  ];  Neuro: TIA[ no ];  headaches[  ];  stroke[  ];  vertigo[  ];  seizures[  ];   paresthesias[  ];  difficulty walking[  ];  Psych:depression[  ]; anxiety[  ];  Endocrine: diabetes[no  ];  thyroid dysfunction[  ];  Immunizations: Flu [  ]; Pneumococcal[  ];  Other:  Physical Exam: BP 133/35  Pulse 55  Temp(Src) 97.7 F (36.5 C) (Oral)  Resp 16  Ht 5' 5.5" (1.664 m)  Wt 175 lb 4.8 oz (79.516 kg)  BMI 28.72 kg/m2  SpO2 98%  Exam, general appearance-middle-aged Caucasian female accompanied by her husband on IV heparin no acute distress HEENT-pupils equal missing teeth no obvious dental infection Neck-no JVD, positive bilateral heart murmurs  transmitted to the neck, no adenopathy Thorax-no deformity or tenderness, scattered rhonchi Cardiac-loud grade 3-1/5 systolic ejection murmur, high-pitched at the upper sternal borders, no diastolic murmur Abdomen-no organomegaly tenderness or pulsatile mass Extremities-mild clubbing no cyanosis edema. Right radial artery dressing from cardiac cath site. Vascular-palpable femoral and radial pulses, nonpalpable pedal pulses, no obvious venous insufficiency of lower extremities Neurologic-alert oriented no focal motor deficit   Diagnostic Studies & Laboratory data:   CT scan of chest personal reviewed showing calcification of the aorta, more significant in the arch but some calcium around the root at the level of the right coronary ostium, the ascending aorta measures approximately 3 cm diameter. No active pulmonary nodules or masses, no pleural effusion or infiltrate   Recent Radiology Findings:   Dg Orthopantogram  06/21/2014   CLINICAL DATA:  Preop for aortic valve replacement surgery.  EXAM: ORTHOPANTOGRAM/PANORAMIC  COMPARISON:  None.  FINDINGS: The right maxillary second premolar tooth demonstrates moderate lucency suggesting dental caries. Mild periapical lucency is also noted. Dental consultation suggested. No other significant abnormalities are identified.  IMPRESSION: Suspect dental caries and periapical disease involving the right maxillary second premolar tooth.   Electronically Signed   By: Kalman Jewels M.D.   On: 06/21/2014 10:54    Dental x-ray show possible significant dental infection which will be assessed by Dr. Enrique Sack  Recent Lab Findings: Lab Results  Component Value Date   WBC 11.3* 06/19/2014   HGB 13.2 06/19/2014   HCT 39.7 06/19/2014   PLT 214.0 06/19/2014   GLUCOSE 95 06/19/2014   CHOL 202* 08/27/2009   TRIG 298.0* 08/27/2009   HDL 33.90* 08/27/2009   LDLDIRECT 101.9 08/27/2009   ALT 16 05/24/2014   AST 28 05/24/2014   NA 139 06/19/2014   K 4.2 06/19/2014   CL 107  06/19/2014   CREATININE 1.5* 06/19/2014   BUN 26* 06/19/2014   CO2 27 06/19/2014   TSH 3.66 08/27/2009   INR 1.0 06/19/2014      Assessment / Plan  Severe multivessel coronary disease with unstable angina Severe aortic stenosis, probable bicuspid with LVH and preserved LV systolic function Normal right heart pressures Strong tobacco abuse history but CT scan of chest shows no active disease in her mechanics of breathing at bedside exam appeared be adequate--PFTs pending Possible significant dental disease which could impact her planned combined aVR-CABG.  I discussed the procedure of aVR-CABG in detail the patient and her husband including indications benefits expected recovery, and risks. She understands that she is in the process of being evaluated in that the surgery could possibly be scheduled for next week. She'll remain hospitalized until surgery.      @ME1 @ 06/21/2014 11:29 AM

## 2014-06-21 NOTE — Progress Notes (Signed)
ANTICOAGULATION CONSULT NOTE - Follow Up Consult  Pharmacy Consult for heparin Indication: 2V CAD  Allergies  Allergen Reactions  . Morphine Nausea And Vomiting    ONLY IN IV FORM    Patient Measurements: Height: 5' 5.5" (166.4 cm) Weight: 175 lb 4.8 oz (79.516 kg) IBW/kg (Calculated) : 58.15 Heparin Dosing Weight: 74.7kg  Vital Signs: Temp: 98.1 F (36.7 C) (07/31 2122) Temp src: Oral (07/31 2122) BP: 130/39 mmHg (07/31 2122) Pulse Rate: 56 (07/31 2126)  Labs:  Recent Labs  06/19/14 1052 06/21/14 0903 06/21/14 1432 06/21/14 2146  HGB 13.2  --   --   --   HCT 39.7  --   --   --   PLT 214.0  --   --   --   LABPROT 11.5  --   --   --   INR 1.0  --   --   --   HEPARINUNFRC  --  0.31 0.29* 0.56  CREATININE 1.5*  --   --   --     Estimated Creatinine Clearance: 39.4 ml/min (by C-G formula based on Cr of 1.5).   Medications:  Prescriptions prior to admission  Medication Sig Dispense Refill  . aspirin EC 81 MG tablet Take 1 tablet (81 mg total) by mouth daily.  90 tablet  3  . lisinopril (PRINIVIL,ZESTRIL) 40 MG tablet Take 1 tablet (40 mg total) by mouth daily.  90 tablet  3  . metoprolol (LOPRESSOR) 50 MG tablet Take 50 mg by mouth 2 (two) times daily.      Marland Kitchen triamterene-hydrochlorothiazide (MAXZIDE) 75-50 MG per tablet Take 1 tablet by mouth daily.      . nitroGLYCERIN (NITROSTAT) 0.4 MG SL tablet Place 1 tablet (0.4 mg total) under the tongue every 5 (five) minutes as needed for chest pain.  90 tablet  3    Assessment: 63 yo female with severe multivessel coronary disease w/ unstable angina s/p sheath removal 7/30. Plan for CABG next week. Pharmacy consulted to dose heparin. Heaprin drip 1200 uts/hr HL 0.56. No bleeding noted. Last Hgb/Hct/PLTC WNL.  Goal of Therapy:  Heparin level 0.3-0.7 units/ml Monitor platelets by anticoagulation protocol: Yes   Plan:  - Continue Heparin gtt to 1200 units/hr.  DailyCBC, HL - Monitor for s/sx of bleeding.   Bonnita Nasuti Pharm.D. CPP, BCPS Clinical Pharmacist [phone] 06/21/2014 10:11 PM

## 2014-06-21 NOTE — Care Management Note (Addendum)
    Page 1 of 2   07/04/2014     4:55:40 PM CARE MANAGEMENT NOTE 07/04/2014  Patient:  Carrie [name], Carrie [name]   Account Number:  [id number]  Date Initiated:  06/21/2014  Documentation initiated by:  GRAVES-BIGELOW,BRENDA  Subjective/Objective Assessment:   Pt admitted for cp. S/p cath revealed 2 vessel CAD. Plan CABG next week. IV heparin infusing.     Action/Plan:   CM will continue to monitor for disposition needs.   Anticipated DC Date:  07/06/2014   Anticipated DC Plan:  [place]  CM consult      [facility]  HOME HEALTH   Choice offered to / List presented to:  C-1 Patient   DME arranged  [place]      DME agency  [place] arranged  HH-1 RN  [place].   Status of service:  In process, will continue to follow Medicare Important Message given?  YES (If response is "NO", the following Medicare IM given date fields will be blank) Date Medicare IM given:  07/01/2014 Medicare IM given by:  MAYO,HENRIETTA Date Additional Medicare IM given:  07/04/2014 Additional Medicare IM given by:  Ellan Lambert  Discharge Disposition:  [place]  Per UR Regulation:  Reviewed for med. necessity/level of care/duration of stay  If discussed at [place] of Stay Meetings, dates discussed:   06/25/2014  06/27/2014  07/02/2014  07/04/2014    Comments:  07/04/14 Ellan Lambert, RN, BSN Met with pt to discuss dc plans; pt to dc home with husband and family.  Requests RW and 3 in 1 for home; agreeable to [facility] follow up at dc.  Will arrange [facility] as ordered; referral to [facility], per pt choice.  Start of care 24-48h post dc date. Referral to [facility] for dme needs.  07/01/14 [place] MSN BSN CCM S/P CABG/AVR.  NTG/neo gtts weaned, ambulating with staff.  06-26-14 [address] RN,BSN [phone] Pt status post multiple extraction of  teeth. Plan for AVR/CABG surgery Friday.

## 2014-06-21 NOTE — Progress Notes (Signed)
ANTICOAGULATION CONSULT NOTE - Follow Up Consult  Pharmacy Consult for heparin Indication: 2V CAD  Allergies  Allergen Reactions  . Morphine Nausea And Vomiting    ONLY IN IV FORM    Patient Measurements: Height: 5' 5.5" (166.4 cm) Weight: 175 lb 4.8 oz (79.516 kg) IBW/kg (Calculated) : 58.15 Heparin Dosing Weight: 74kg  Vital Signs: Temp: 97.7 F (36.5 C) (07/31 0500) BP: 133/35 mmHg (07/31 0947) Pulse Rate: 55 (07/31 0955)  Labs:  Recent Labs  06/19/14 1052 06/21/14 0903  HGB 13.2  --   HCT 39.7  --   PLT 214.0  --   LABPROT 11.5  --   INR 1.0  --   HEPARINUNFRC  --  0.31  CREATININE 1.5*  --     Estimated Creatinine Clearance: 39.4 ml/min (by C-G formula based on Cr of 1.5).   Medications:  Prescriptions prior to admission  Medication Sig Dispense Refill  . aspirin EC 81 MG tablet Take 1 tablet (81 mg total) by mouth daily.  90 tablet  3  . lisinopril (PRINIVIL,ZESTRIL) 40 MG tablet Take 1 tablet (40 mg total) by mouth daily.  90 tablet  3  . metoprolol (LOPRESSOR) 50 MG tablet Take 50 mg by mouth 2 (two) times daily.      Marland Kitchen triamterene-hydrochlorothiazide (MAXZIDE) 75-50 MG per tablet Take 1 tablet by mouth daily.      . nitroGLYCERIN (NITROSTAT) 0.4 MG SL tablet Place 1 tablet (0.4 mg total) under the tongue every 5 (five) minutes as needed for chest pain.  90 tablet  3    Assessment: 63 yo female s/p sheath removal. Pharmacy consulted to start heparin. Heparin start at 0059, 8h HL therapeutic at 0.31. No bleeding noted. Last Hgb/Hct/PLTC WNL.  Goal of Therapy:  Heparin level 0.3-0.7 units/ml Monitor platelets by anticoagulation protocol: Yes   Plan:  - Continue Heparin gtt at 1000 units/hr.  - Check heparin level 6 hr to confirm therapeutic - Monitor for s/sx of bleeding.   Thank you for allowing pharmacy to be part of this patients care team  Jenie Parish M. Francella Barnett, Pharm.D Clinical Pharmacy Resident Pager: [phone] 06/21/2014 .11:14 AM

## 2014-06-21 NOTE — Progress Notes (Signed)
UR Completed Salimatou Simone Graves-Bigelow, RN,BSN [phone]  

## 2014-06-21 NOTE — Consult Note (Signed)
DENTAL CONSULTATION  Date of Consultation:  06/21/2014 Patient Name:   Carrie [name] Date of Birth:   [birth year] Medical Record Number: [id number]  VITALS: BP 133/35  Pulse 55  Temp(Src) 97.7 F (36.5 C) (Oral)  Resp 16  Ht 5' 5.5" (1.664 m)  Wt 175 lb 4.8 oz (79.516 kg)  BMI 28.72 kg/m2  SpO2 98%   CHIEF COMPLAINT: Patient referred for a Pre-aortic valve replacement dental protocol examination.  History of present illness: Carrie [name] is a 63 year old female recently diagnosed with 2 vessel coronary artery disease and severe aortic stenosis. Patient with anticipated aortic valve replacement with 2 vessel coronary artery bypass graft procedure. Patient is now seen as part of a medically necessary pre-heart valve surgery dental protocol examination.  The patient currently denies acute toothache, swellings, or abscesses. Patient was last seen by a dentist approximately a year ago to have two lower teeth extracted. This was with Dr. Feliberto Harts, an oral surgeon. Patient denies having any dental complications associated with those dental extractions. Patient denies having any partial dentures. Patient cannot remember the name of her regular dentist.    PROBLEM LIST: Patient Active Problem List   Diagnosis Date Noted  . Unstable angina 06/16/2014  . Hypertensive cardiovascular disease 06/16/2014  . Tobacco abuse 06/16/2014  . Bursitis of left shoulder 07/28/2011  . CANDIDIASIS, SKIN 06/18/2010  . PITYRIASIS ROSEA 02/12/2010  . OSTEOARTHRITIS, HIP 02/12/2010  . DERMATOMYCOSIS 09/03/2009  . HYPERCHOLESTEROLEMIA 07/14/2009  . Tobacco use disorder 07/14/2009  . BRADYCARDIA 07/11/2009  . PALPITATIONS 07/11/2009  . SYSTOLIC MURMUR 76/28/3151  . CAROTID BRUIT 07/11/2009  . NECK PAIN 07/10/2009  . HYPERTENSION 02/20/2009  . ARTHRITIS, LEFT SACROILIAC JOINT 02/20/2009  . ARTHRITIS, BACK 02/20/2009  . SCIATICA, ACUTE 02/20/2009    PMH: Past Medical History  Diagnosis Date  .  Hypertension   . Hyperlipidemia   . Carotid bruit   . Sinus bradycardia   . Palpitations   . Tobacco abuse   . Heart murmur   . DDD (degenerative disc disease), lumbar   . Chronic lower back pain   . Coronary artery disease     PSH: Past Surgical History  Procedure Laterality Date  . Heel spur surgery Right   . Cataract extraction w/ intraocular lens  implant, bilateral Bilateral ~ 02/2014  . Hip orif w/ capsulotomy Left 1980's  . Tonsillectomy  1978  . Cardiac catheterization  06/20/2014  . Abdominal hysterectomy  1970's    ALLERGIES: Allergies  Allergen Reactions  . Morphine Nausea And Vomiting    ONLY IN IV FORM    MEDICATIONS: Current Facility-Administered Medications  Medication Dose Route Frequency Provider Last Rate Last Dose  . acetaminophen (TYLENOL) tablet 650 mg  650 mg Oral Q4H PRN Peter M Martinique, MD      . aspirin EC tablet 81 mg  81 mg Oral Daily Peter M Martinique, MD   81 mg at 06/21/14 0946  . atorvastatin (LIPITOR) tablet 80 mg  80 mg Oral q1800 Peter M Martinique, MD      . budesonide-formoterol [facility]) 160-4.5 MCG/ACT inhaler 2 puff  2 puff Inhalation BID Ivin Poot, MD   2 puff at 06/21/14 [phone]  . Chlorhexidine Gluconate Cloth 2 % PADS 6 each  6 each Topical Daily Thompson Grayer, MD   6 each at 06/21/14 0950  . heparin ADULT infusion 100 units/mL (25000 units/250 mL)  1,000 Units/hr Intravenous Continuous Juanda Chance Amend, RPH 10 mL/hr at 06/21/14 0059 1,000  Units/hr at 06/21/14 0059  . HYDROcodone-acetaminophen (NORCO) 10-325 MG per tablet 1 tablet  1 tablet Oral Q6H PRN Larey Dresser, MD   1 tablet at 06/21/14 [phone]  . lisinopril (PRINIVIL,ZESTRIL) tablet 40 mg  40 mg Oral Daily Peter M Martinique, MD   40 mg at 06/21/14 0947  . metoprolol (LOPRESSOR) tablet 50 mg  50 mg Oral BID Peter M Martinique, MD   50 mg at 06/21/14 0955  . mupirocin ointment (BACTROBAN) 2 % 1 application  1 application Nasal BID Thompson Grayer, MD   1 application at 82/42/35 [phone]  .  nitroGLYCERIN (NITROSTAT) SL tablet 0.4 mg  0.4 mg Sublingual Q5 min PRN Peter M Martinique, MD      . ondansetron [facility]) injection 4 mg  4 mg Intravenous Q6H PRN Peter M Martinique, MD      . pneumococcal 23 valent vaccine (PNU-IMMUNE) injection 0.5 mL  0.5 mL Intramuscular Tomorrow-1000 Thompson Grayer, MD        LABS: Lab Results  Component Value Date   WBC 11.3* 06/19/2014   HGB 13.2 06/19/2014   HCT 39.7 06/19/2014   MCV 91.6 06/19/2014   PLT 214.0 06/19/2014      Component Value Date/Time   NA 139 06/19/2014 1052   K 4.2 06/19/2014 1052   CL 107 06/19/2014 1052   CO2 27 06/19/2014 1052   GLUCOSE 95 06/19/2014 1052   BUN 26* 06/19/2014 1052   CREATININE 1.5* 06/19/2014 1052   CREATININE 1.35* 05/24/2014 1404   CALCIUM 9.4 06/19/2014 1052   GFRNONAA 41* 04/23/2013 1030   GFRAA 47* 04/23/2013 1030   Lab Results  Component Value Date   INR 1.0 06/19/2014   No results found for this basename: PTT    SOCIAL HISTORY: History   Social History  . Marital Status: Married    Spouse Name: [name]    Number of Children: [name]  . Years of Education: [name]   Occupational History  .  [employer]   Social History Main Topics  . Smoking status: Current Every Day Smoker -- 0.75 packs/day for 50 years    Types: Cigarettes  . Smokeless tobacco: Never Used     Comment: 06/20/2014 "I've been using the e-cigarettes to try to get me off the regular cigarettes"  . Alcohol Use: No  . Drug Use: No  . Sexual Activity: Not Currently   Other Topics Concern  . Not on file   Social History Narrative   Marital status: married x 13 years      Children: 2 children; 3 grandchildren.      Lives: with husband, 2 dogs.      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 Hypertension Mother   . Heart disease Mother     CAD; needs pacemaker75   . Hypertension Father   . Heart disease Father 37    pacemaker  . Hyperlipidemia Brother      REVIEW OF SYSTEMS: Reviewed from the chart for this admission.  DENTAL HISTORY: CHIEF COMPLAINT: Patient referred for a Pre-aortic valve replacement dental protocol examination.  History of present illness: Carrie [name] is a 63 year old female recently diagnosed with 2 vessel coronary artery disease  and severe aortic stenosis. Patient with anticipated aortic valve replacement with 2 vessel coronary artery bypass graft procedure. Patient is now seen as part of a medically necessary pre-heart valve surgery dental protocol examination.  The patient currently denies acute toothache, swellings, or abscesses. Patient was last seen by a dentist approximately a year ago to have two lower teeth extracted. This was with Dr. Feliberto Harts, an oral surgeon. Patient denies having any dental complications associated with those dental extractions. Patient denies having any partial dentures. Patient cannot remember the name of her regular dentist.  DENTAL EXAMINATION: GENERAL: Patient is a well-developed, well-nourished female in no acute distress. HEAD AND NECK: There is no palpable submandibular lymphadenopathy. Patient denies acute TMJ symptoms. INTRAORAL EXAM: Patient has normal saliva. I do not see any evidence of oral abscess formation. DENTITION: The patient is missing tooth numbers 1, 14, 16, 17, 29, 30, 31, and 32. Tooth #4 present as a retained root segment. PERIODONTAL: Patient has chronic periodontitis with plaque and calculus accumulations, generalized gingival recession and incipient tooth mobility. DENTAL CARIES/SUBOPTIMAL RESTORATIONS: Patient has multiple dental caries affecting tooth numbers 4 and 18. ENDODONTIC: Patient currently denies acute pulpitis symptoms. There appears to be incipient periapical radiolucency associated with apex of tooth #4. CROWN AND BRIDGE: There are no crown or bridge  restorations. PROSTHODONTIC: Patient denies having any partial dentures. OCCLUSION: Patient has a poor occlusal scheme secondary to multiple missing teeth, retained root segment #4, and supra-eruption and drifting of the unopposed teeth into the edentulous areas.  RADIOGRAPHIC INTERPRETATION: An orthopantogram was taken on 06/21/2014. There are multiple missing teeth. There is a retained root segment #4. Multiple dental caries are noted. There is incipient to moderate bone loss. Malocclusion is noted.   ASSESSMENTS: 1. Aortic stenosis with anticipated aortic valve replacement 2. Coronary artery disease with anticipated 2 vessel coronary artery bypass graft procedure 3. Heparin therapy with risk for bleeding with invasive dental procedures 4. Pre-heart valve surgery dental protocol examination 5. Retained root segment #4 6. Chronic apical periodontitis 7. Chronic periodontitis with bone loss 8. Accretions 9. Gingival recession 10. Tooth mobility 11. Missing teeth 12. Supra-eruption and drifting of the unopposed teeth into the edentulous areas 13. Dental caries 14. Poor occlusal scheme and malocclusion 15. Cardiovascular compromise with potential for complications up to and including death with anticipated invasive dental procedures in the operating room with general anesthesia.     PLAN/RECOMMENDATIONS: 1. I discussed the risks, benefits, and complications of various treatment options with the patient in relationship to  her medical and dental conditions, anticipated aortic valve replacement, and potential risk for future endocarditis . We discussed various treatment options to include no treatment, multiple extractions with alveoloplasty, pre-prosthetic surgery as indicated, periodontal therapy, dental restorations, root canal therapy, crown and bridge therapy, implant therapy, and replacement of missing teeth as indicated. The patient currently wishes to proceed with extraction of  tooth numbers 2, 3, 4, 18, 19 with alveoloplasty and gross debridement of remaining dentition the operating room with general anesthesia. This is been scheduled for Tuesday, 06/25/2014 at 10:15 AM.  Patient will then be scheduled for aortic valve replacement and coronary artery bypass graft procedure approximately 2-3 days later per TCTS. Patient will then follow up with the dentist of her choice for continued dental care and evaluation for replacement of missing teeth as indicated.   2. Discussion of findings with medical team and coordination of future medical and dental care as needed.   Lenn Cal, DDS

## 2014-06-21 NOTE — Progress Notes (Signed)
CARDIAC REHAB PHASE I   PRE:  Rate/Rhythm: 52 SB  BP:  Supine:   Sitting: 110/66  Standing:    SaO2: 97%RA  MODE:  Ambulation: 350 ft   POST:  Rate/Rhythm: 52  BP:  Supine:   Sitting: 150/78  Standing:    SaO2: 98%RA 1325-1415 Pt walked 350 ft on RA with steady gait. Stopped once to rest due to leg and back discomfort but not CP. Discussed with pt importance of mobility and IS after surgery. Demonstrated getting up and down adhering to sternal precautions. Has IS in room and pt states can get 2000 ml. Wrote down how to view pre op video and encouraged pt to view with family. Gave OHS booklet and care guide. Pt knows will need someone with her 24/7 first week she is home. Will see for ambulation.   Graylon Good, RN BSN  06/21/2014 2:11 PM

## 2014-06-21 NOTE — Progress Notes (Signed)
SUBJECTIVE:  No chest pain.  Tolerated cath well.  Spoke to Dr. Prescott Gum.  OBJECTIVE:   Vitals:   Filed Vitals:   06/21/14 0500 06/21/14 0918 06/21/14 0947 06/21/14 0955  BP: 121/46  133/35   Pulse: 48   55  Temp: 97.7 F (36.5 C)     TempSrc:      Resp: 16     Height:      Weight: 175 lb 4.8 oz (79.516 kg)     SpO2: 97% 98%     I&O's:   Intake/Output Summary (Last 24 hours) at 06/21/14 1214 Last data filed at 06/21/14 9562  Gross per 24 hour  Intake    480 ml  Output    975 ml  Net   -495 ml        PHYSICAL EXAM General: Well developed, well nourished, in no acute distress Head:   Normal cephalic and atramatic  Lungs:   Clear bilaterally to auscultation. Heart:   HRRR S1 S2  No JVD.   Abdomen: abdomen soft and non-tender Msk:  Back normal,  Normal strength and tone for age. Extremities:   No edema.  No bleeding at right wrist. Neuro: Alert and oriented. Psych:  Normal affect, responds appropriately   LABS: Basic Metabolic Panel:  Recent Labs  06/19/14 1052  NA 139  K 4.2  CL 107  CO2 27  GLUCOSE 95  BUN 26*  CREATININE 1.5*  CALCIUM 9.4   Liver Function Tests: No results found for this basename: AST, ALT, ALKPHOS, BILITOT, PROT, ALBUMIN,  in the last 72 hours No results found for this basename: LIPASE, AMYLASE,  in the last 72 hours CBC:  Recent Labs  06/19/14 1052  WBC 11.3*  HGB 13.2  HCT 39.7  MCV 91.6  PLT 214.0   Cardiac Enzymes: No results found for this basename: CKTOTAL, CKMB, CKMBINDEX, TROPONINI,  in the last 72 hours BNP: No components found with this basename: POCBNP,  D-Dimer: No results found for this basename: DDIMER,  in the last 72 hours Hemoglobin A1C: No results found for this basename: HGBA1C,  in the last 72 hours Fasting Lipid Panel: No results found for this basename: CHOL, HDL, LDLCALC, TRIG, CHOLHDL, LDLDIRECT,  in the last 72 hours Thyroid Function Tests: No results found for this basename: TSH, T4TOTAL,  FREET3, T3FREE, THYROIDAB,  in the last 72 hours Anemia Panel: No results found for this basename: VITAMINB12, FOLATE, FERRITIN, TIBC, IRON, RETICCTPCT,  in the last 72 hours Coag Panel:   Lab Results  Component Value Date   INR 1.0 06/19/2014    RADIOLOGY: Dg Orthopantogram  06/21/2014   CLINICAL DATA:  Preop for aortic valve replacement surgery.  EXAM: ORTHOPANTOGRAM/PANORAMIC  COMPARISON:  None.  FINDINGS: The right maxillary second premolar tooth demonstrates moderate lucency suggesting dental caries. Mild periapical lucency is also noted. Dental consultation suggested. No other significant abnormalities are identified.  IMPRESSION: Suspect dental caries and periapical disease involving the right maxillary second premolar tooth.   Electronically Signed   By: Kalman Jewels M.D.   On: 06/21/2014 10:54   Dg Chest 2 View  05/24/2014   CLINICAL DATA:  Shortness of breath  EXAM: CHEST  2 VIEW  COMPARISON:  04/23/2013  FINDINGS: The heart size and mediastinal contours are within normal limits. Both lungs are clear. The visualized skeletal structures are unremarkable.  IMPRESSION: No active cardiopulmonary disease.   Electronically Signed   By: Kathreen Devoid   On: 05/24/2014  14:14   Ct Chest Wo Contrast  06/21/2014   CLINICAL DATA:  Pre-op for heart surgery. Evaluate aortic calcification.  EXAM: CT CHEST WITHOUT CONTRAST  TECHNIQUE: Multidetector CT imaging of the chest was performed following the standard protocol without IV contrast.  COMPARISON:  Chest radiographs 05/24/2014.  FINDINGS: Mediastinum: There are no enlarged mediastinal, hilar or axillary lymph nodes. The thyroid gland, trachea and esophagus appear normal. The heart size is normal. There is moderate diffuse atherosclerosis of the aorta, great vessels and coronary arteries. There is dense calcification of the aortic valve which appears tricuspid. Mitral annular calcifications are noted.There is no significant dilatation of the aortic lumen.   Lungs/Pleura: There is no pleural or pericardial effusion.Mild emphysematous changes are noted. There is no suspicious pulmonary nodule or confluent airspace opacity.  Upper abdomen: The liver demonstrates mild contour irregularity in the left lobe. No focal abnormality identified. The adrenal glands appear normal.  Musculoskeletal/Chest wall: No chest wall abnormality or suspicious osseous lesions. There are degenerative changes throughout the spine.  IMPRESSION: 1. Moderate diffuse atherosclerosis. 2. Dense calcification of a tricuspid aortic valve consistent with aortic stenosis. 3. Mild emphysema. 4. No acute findings demonstrated.   Electronically Signed   By: Camie Patience M.D.   On: 06/21/2014 11:38      ASSESSMENT: Kathyrn Lass:   CAD: 2 vessel. Plan for CABG. Will need repeat labs to see post cath Cr.   Continue ASA, statin, beta blocker, ACE-I. Aortic stenosis: Plan for AVR.  Patient agreeable.  Surgery next week.  Jettie Booze., MD  06/21/2014  12:14 PM

## 2014-06-22 DIAGNOSIS — E039 Hypothyroidism, unspecified: Secondary | ICD-10-CM

## 2014-06-22 DIAGNOSIS — Z952 Presence of prosthetic heart valve: Secondary | ICD-10-CM

## 2014-06-22 HISTORY — DX: Presence of prosthetic heart valve: Z95.2

## 2014-06-22 HISTORY — DX: Hypothyroidism, unspecified: E03.9

## 2014-06-22 LAB — BASIC METABOLIC PANEL
ANION GAP: 14 (ref 5–15)
BUN: 26 mg/dL — ABNORMAL HIGH (ref 6–23)
CHLORIDE: 102 meq/L (ref 96–112)
CO2: 22 mEq/L (ref 19–32)
Calcium: 8.8 mg/dL (ref 8.4–10.5)
Creatinine, Ser: 1.31 mg/dL — ABNORMAL HIGH (ref 0.50–1.10)
GFR calc non Af Amer: 42 mL/min — ABNORMAL LOW (ref >90)
GFR, EST AFRICAN AMERICAN: 48 mL/min — AB (ref >90)
Glucose, Bld: 96 mg/dL (ref 70–99)
Potassium: 4.2 mEq/L (ref 3.7–5.3)
SODIUM: 138 meq/L (ref 137–147)

## 2014-06-22 LAB — CBC
HCT: 36.3 % (ref 36.0–46.0)
HEMOGLOBIN: 12 g/dL (ref 12.0–15.0)
MCH: 30.2 pg (ref 26.0–34.0)
MCHC: 33.1 g/dL (ref 30.0–36.0)
MCV: 91.4 fL (ref 78.0–100.0)
Platelets: 131 10*3/uL — ABNORMAL LOW (ref 150–400)
RBC: 3.97 MIL/uL (ref 3.87–5.11)
RDW: 14.1 % (ref 11.5–15.5)
WBC: 8 10*3/uL (ref 4.0–10.5)

## 2014-06-22 LAB — HEPARIN LEVEL (UNFRACTIONATED): HEPARIN UNFRACTIONATED: 0.49 [IU]/mL (ref 0.30–0.70)

## 2014-06-22 MED ORDER — DOCUSATE SODIUM 100 MG PO CAPS
100.0000 mg | ORAL_CAPSULE | Freq: Two times a day (BID) | ORAL | Status: DC | PRN
  Administered 2014-06-22 – 2014-06-27 (×3): 100 mg via ORAL
  Filled 2014-06-22 (×3): qty 1

## 2014-06-22 MED ORDER — ALUM & MAG HYDROXIDE-SIMETH 200-200-20 MG/5ML PO SUSP
30.0000 mL | ORAL | Status: DC | PRN
Start: 2014-06-22 — End: 2014-06-28
  Administered 2014-06-22 – 2014-06-27 (×5): 30 mL via ORAL
  Filled 2014-06-22 (×7): qty 30

## 2014-06-22 NOTE — Progress Notes (Signed)
Subjective:  No complaints of shortness or breath or chest pain  Objective:  Vital Signs in the last 24 hours: BP 131/38  Pulse 53  Temp(Src) 98.9 F (37.2 C) (Oral)  Resp 18  Ht 5' 5.5" (1.664 m)  Wt 79.516 kg (175 lb 4.8 oz)  BMI 28.72 kg/m2  SpO2 95%  Physical Exam: Pleasant female in no acute distress on heparin Lungs:  Clear Cardiac:  Regular rhythm, normal S1 and S2, no S3, harsh 6-5/4 systolic murmur at aortic area radiating to neck Extremities:  No edema present  Intake/Output from previous day: 07/31 0701 - 08/01 0700 In: 120 [P.O.:120] Out: 350 [Urine:350]  Weight Filed Weights   06/20/14 1043 06/20/14 1220 06/21/14 0500  Weight: 320.239 kg (706 lb) 79.833 kg (176 lb) 79.516 kg (175 lb 4.8 oz)    Lab Results: Basic Metabolic Panel:  Recent Labs  06/19/14 1052 06/22/14 0429  NA 139 138  K 4.2 4.2  CL 107 102  CO2 27 22  GLUCOSE 95 96  BUN 26* 26*  CREATININE 1.5* 1.31*   CBC:  Recent Labs  06/19/14 1052 06/22/14 0429  WBC 11.3* 8.0  HGB 13.2 12.0  HCT 39.7 36.3  MCV 91.6 91.4  PLT 214.0 131*   Telemetry: Sinus rhythm  Assessment/Plan:  1. Severe aortic stenosis 2. Unstable angina pectoris with two-vessel coronary artery disease 3. Hypertension  Recommendations:  Plan is for her to have been for done next week with surgery later on in the week. Continue heparin     W. Doristine Church  MD [facility]  06/22/2014, 10:21 AM

## 2014-06-22 NOTE — Plan of Care (Signed)
Problem: Phase II Progression Outcomes Goal: Vascular site scale level 0 - I Vascular Site Scale Level 0: No bruising/bleeding/hematoma Level I (Mild): Bruising/Ecchymosis, minimal bleeding/ooozing, palpable hematoma < 3 cm Level II (Moderate): Bleeding not affecting hemodynamic parameters, pseudoaneurysm, palpable hematoma > 3 cm Level III (Severe) Bleeding which affects hemodynamic parameters or retroperitoneal hemorrhage  Outcome: Completed/Met Date Met:  06/22/14 Right radial level (0).

## 2014-06-22 NOTE — Progress Notes (Signed)
ANTICOAGULATION CONSULT NOTE - Follow Up Consult  Pharmacy Consult for Heparin Indication: 2v CAD awaiting AVR/CABG  Allergies  Allergen Reactions  . Morphine Nausea And Vomiting    ONLY IN IV FORM    Patient Measurements: Height: 5' 5.5" (166.4 cm) Weight: 175 lb 4.8 oz (79.516 kg) IBW/kg (Calculated) : 58.15  Vital Signs: Temp: 98.9 F (37.2 C) (08/01 0500) Temp src: Oral (07/31 2122) BP: 131/38 mmHg (08/01 0500) Pulse Rate: 53 (08/01 0500)  Labs:  Recent Labs  06/19/14 1052  06/21/14 1432 06/21/14 2146 06/22/14 0429  HGB 13.2  --   --   --  12.0  HCT 39.7  --   --   --  36.3  PLT 214.0  --   --   --  131*  LABPROT 11.5  --   --   --   --   INR 1.0  --   --   --   --   HEPARINUNFRC  --   < > 0.29* 0.56 0.49  CREATININE 1.5*  --   --   --  1.31*  < > = values in this interval not displayed.  Estimated Creatinine Clearance: 45.1 ml/min (by C-G formula based on Cr of 1.31).   Medications:  Heparin 1200 units/hr   Assessment: 63yof with severe multivessel CAD continues on heparin while awaiting AVR/CABG next week. Heparin level (0.49) remains therapeutic (x 2) - will continue current rate and follow-up AM labs.  - H/H and Plts trending down --> monitor s/sx of bleeding - No significant bleeding reported per RN  Goal of Therapy:  Heparin level 0.3-0.7 units/ml Monitor platelets by anticoagulation protocol: Yes   Plan:  1. Continue heparin 1200 units/hr (12 ml/hr) 2. Daily heparin level and CBC 3. Monitor s/sx of bleeding with decreasing H/H and Plts  Earleen Newport [phone] 06/22/2014,7:36 AM

## 2014-06-22 NOTE — Progress Notes (Signed)
CARDIAC REHAB PHASE I   PRE:  Rate/Rhythm: 25 SB  BP:  Sitting: 116/60  MODE:  Ambulation: 350 ft   POST:  Rate/Rhythm: 54 SB  BP:  Sitting: 120/80  Pt walked 35ft with assist x1.  Pt had no c/o of SOB or CP at this time.  Returned pt to bed with phone and call button in reach.  Answered pt questions in regards to upcoming surgery.  Pt voiced understanding. [phone]  Lillia Dallas MS, ACSM RCEP 11:12 AM 06/22/2014

## 2014-06-23 LAB — CBC
HCT: 37.6 % (ref 36.0–46.0)
HEMOGLOBIN: 12.3 g/dL (ref 12.0–15.0)
MCH: 30.4 pg (ref 26.0–34.0)
MCHC: 32.7 g/dL (ref 30.0–36.0)
MCV: 93.1 fL (ref 78.0–100.0)
Platelets: 132 10*3/uL — ABNORMAL LOW (ref 150–400)
RBC: 4.04 MIL/uL (ref 3.87–5.11)
RDW: 14 % (ref 11.5–15.5)
WBC: 7.8 10*3/uL (ref 4.0–10.5)

## 2014-06-23 LAB — HEPARIN LEVEL (UNFRACTIONATED): HEPARIN UNFRACTIONATED: 0.47 [IU]/mL (ref 0.30–0.70)

## 2014-06-23 MED ORDER — MAGNESIUM HYDROXIDE 400 MG/5ML PO SUSP
30.0000 mL | Freq: Every day | ORAL | Status: DC | PRN
Start: 2014-06-23 — End: 2014-06-28
  Administered 2014-06-23: 30 mL via ORAL
  Filled 2014-06-23: qty 30

## 2014-06-23 NOTE — Progress Notes (Signed)
Subjective:  No complaints of shortness or breath or chest pain  Objective:  Vital Signs in the last 24 hours: BP 131/53  Pulse 54  Temp(Src) 98 F (36.7 C) (Oral)  Resp 18  Ht 5' 5.5" (1.664 m)  Wt 79.516 kg (175 lb 4.8 oz)  BMI 28.72 kg/m2  SpO2 96%  Physical Exam: Pleasant female in no acute distress on heparin Lungs:  Clear Cardiac:  Regular rhythm, normal S1 and S2, no S3, harsh 3-1/5 systolic murmur at aortic area radiating to neck Extremities:  No edema present  Intake/Output from previous day: 08/01 0701 - 08/02 0700 In: 960 [P.O.:960] Out: 1400 [Urine:1400]  Weight Filed Weights   06/20/14 1043 06/20/14 1220 06/21/14 0500  Weight: 320.239 kg (706 lb) 79.833 kg (176 lb) 79.516 kg (175 lb 4.8 oz)    Lab Results: Basic Metabolic Panel:  Recent Labs  06/22/14 0429  NA 138  K 4.2  CL 102  CO2 22  GLUCOSE 96  BUN 26*  CREATININE 1.31*   CBC:  Recent Labs  06/22/14 0429 06/23/14 0440  WBC 8.0 7.8  HGB 12.0 12.3  HCT 36.3 37.6  MCV 91.4 93.1  PLT 131* 132*   Telemetry: Sinus rhythm  Assessment/Plan:  1. Severe aortic stenosis 2. Unstable angina pectoris with two-vessel coronary artery disease 3. Hypertension  Recommendations:  Stable at this time with no recurrent symptoms. Plans are for dental work to be done next week followed by surgery.     Kerry Hough  MD [facility]  06/23/2014, 11:12 AM

## 2014-06-23 NOTE — Progress Notes (Signed)
ANTICOAGULATION CONSULT NOTE - Follow Up Consult  Pharmacy Consult for Heparin Indication: 2v CAD awaiting AVR/CABG  Allergies  Allergen Reactions  . Morphine Nausea And Vomiting    ONLY IN IV FORM    Patient Measurements: Height: 5' 5.5" (166.4 cm) Weight: 175 lb 4.8 oz (79.516 kg) IBW/kg (Calculated) : 58.15  Vital Signs: Temp: 98 F (36.7 C) (08/02 0500) BP: 117/52 mmHg (08/02 0500) Pulse Rate: 52 (08/02 0500)  Labs:  Recent Labs  06/21/14 2146 06/22/14 0429 06/23/14 0440  HGB  --  12.0 12.3  HCT  --  36.3 37.6  PLT  --  131* 132*  HEPARINUNFRC 0.56 0.49 0.47  CREATININE  --  1.31*  --     Estimated Creatinine Clearance: 45.1 ml/min (by C-G formula based on Cr of 1.31).   Medications:  Heparin 1200 units/hr   Assessment: 63yof with severe multivessel CAD continues on heparin while awaiting AVR/CABG next week. Heparin level (0.47) remains therapeutic (x 3) - will continue current rate and follow-up AM labs.  - H/H wnl, Plts low but stable - No significant bleeding reported per RN  Goal of Therapy:  Heparin level 0.3-0.7 units/ml Monitor platelets by anticoagulation protocol: Yes   Plan:  1. Continue heparin 1200 units/hr (12 ml/hr) 2. Daily heparin level and CBC 3. Monitor s/sx of bleeding with decreasing H/H and Plts  Earleen Newport [phone] 06/23/2014,7:26 AM

## 2014-06-24 DIAGNOSIS — I2510 Atherosclerotic heart disease of native coronary artery without angina pectoris: Secondary | ICD-10-CM | POA: Diagnosis present

## 2014-06-24 DIAGNOSIS — I350 Nonrheumatic aortic (valve) stenosis: Secondary | ICD-10-CM | POA: Diagnosis present

## 2014-06-24 LAB — PULMONARY FUNCTION TEST
DL/VA % pred: 91 %
DL/VA: 4.28 ml/min/mmHg/L
DLCO cor % pred: 76 %
DLCO cor: 17.55 ml/min/mmHg
DLCO unc % pred: 76 %
DLCO unc: 17.44 ml/min/mmHg
FEF 25-75 Post: 3.3 L/sec
FEF 25-75 Pre: 3 L/sec
FEF2575-%Change-Post: 9 %
FEF2575-%Pred-Post: 159 %
FEF2575-%Pred-Pre: 145 %
FEV1-%Change-Post: 1 %
FEV1-%Pred-Post: 98 %
FEV1-%Pred-Pre: 96 %
FEV1-Post: 2.27 L
FEV1-Pre: 2.23 L
FEV1FVC-%Change-Post: -9 %
FEV1FVC-%Pred-Pre: 115 %
FEV6-%Change-Post: 11 %
FEV6-%Pred-Post: 96 %
FEV6-%Pred-Pre: 86 %
FEV6-Post: 2.79 L
FEV6-Pre: 2.51 L
FEV6FVC-%Change-Post: 0 %
FEV6FVC-%Pred-Post: 103 %
FEV6FVC-%Pred-Pre: 104 %
FVC-%Change-Post: 11 %
FVC-%Pred-Post: 92 %
FVC-%Pred-Pre: 82 %
FVC-Post: 2.8 L
FVC-Pre: 2.51 L
Post FEV1/FVC ratio: 81 %
Post FEV6/FVC ratio: 100 %
Pre FEV1/FVC ratio: 89 %
Pre FEV6/FVC Ratio: 100 %
RV % pred: 95 %
RV: 1.95 L
TLC % pred: 95 %
TLC: 4.7 L

## 2014-06-24 LAB — HEPARIN LEVEL (UNFRACTIONATED): Heparin Unfractionated: 0.5 IU/mL (ref 0.30–0.70)

## 2014-06-24 LAB — CBC
HCT: 38.2 % (ref 36.0–46.0)
Hemoglobin: 12.1 g/dL (ref 12.0–15.0)
MCH: 29.8 pg (ref 26.0–34.0)
MCHC: 31.7 g/dL (ref 30.0–36.0)
MCV: 94.1 fL (ref 78.0–100.0)
Platelets: 136 10*3/uL — ABNORMAL LOW (ref 150–400)
RBC: 4.06 MIL/uL (ref 3.87–5.11)
RDW: 14.2 % (ref 11.5–15.5)
WBC: 8.4 10*3/uL (ref 4.0–10.5)

## 2014-06-24 MED ORDER — CEFAZOLIN SODIUM-DEXTROSE 2-3 GM-% IV SOLR
2.0000 g | Freq: Once | INTRAVENOUS | Status: AC
  Administered 2014-06-25: 2 g via INTRAVENOUS
  Filled 2014-06-24: qty 50

## 2014-06-24 NOTE — Progress Notes (Signed)
Patient Profile 63 year old female recently diagnosed with 2 vessel coronary artery disease and severe aortic stenosis. Patient with anticipated aortic valve replacement with 2 vessel coronary artery bypass graft procedure. Will undergo dental extractions by Dr. Enrique Sack on 06/25/14 prior to open heart surgery.    Subjective: No complaints. Denies angina, dyspnea, syncope/ near syncope.   Objective: Vital signs in last 24 hours: Temp:  [97.8 F (36.6 C)-97.9 F (36.6 C)] 97.9 F (36.6 C) (08/03 0500) Pulse Rate:  [54-57] 55 (08/03 0906) Resp:  [16] 16 (08/03 0906) BP: (105-140)/(45-92) 105/54 mmHg (08/03 0829) SpO2:  [95 %-99 %] 98 % (08/03 0906) Weight:  [174 lb 9.6 oz (79.198 kg)] 174 lb 9.6 oz (79.198 kg) (08/03 0500) Last BM Date: 06/24/14  Intake/Output from previous day: 08/02 0701 - 08/03 0700 In: 720 [P.O.:720] Out: 675 [Urine:675] Intake/Output this shift:    Medications Current Facility-Administered Medications  Medication Dose Route Frequency Provider Last Rate Last Dose  . acetaminophen (TYLENOL) tablet 650 mg  650 mg Oral Q4H PRN Peter M Martinique, MD      . alum & mag hydroxide-simeth (MAALOX/MYLANTA) 200-200-20 MG/5ML suspension 30 mL  30 mL Oral PRN Thompson Grayer, MD   30 mL at 06/23/14 0931  . aspirin EC tablet 81 mg  81 mg Oral Daily Peter M Martinique, MD   81 mg at 06/23/14 0915  . atorvastatin (LIPITOR) tablet 80 mg  80 mg Oral q1800 Thompson Grayer, MD   80 mg at 06/23/14 1636  . budesonide-formoterol (SYMBICORT) 160-4.5 MCG/ACT inhaler 2 puff  2 puff Inhalation BID Ivin Poot, MD   2 puff at 06/24/14 [phone]  . Chlorhexidine Gluconate Cloth 2 % PADS 6 each  6 each Topical Daily Thompson Grayer, MD   6 each at 06/23/14 [phone]  . docusate sodium (COLACE) capsule 100 mg  100 mg Oral BID PRN Jacolyn Reedy, MD   100 mg at 06/23/14 0946  . heparin ADULT infusion 100 units/mL (25000 units/250 mL)  1,200 Units/hr Intravenous Continuous Osborne Oman, RPH 12 mL/hr at 06/23/14  1927 1,200 Units/hr at 06/23/14 1927  . HYDROcodone-acetaminophen (NORCO) 10-325 MG per tablet 1 tablet  1 tablet Oral Q6H PRN Larey Dresser, MD   1 tablet at 06/23/14 2253  . lisinopril (PRINIVIL,ZESTRIL) tablet 40 mg  40 mg Oral Daily Peter M Martinique, MD   40 mg at 06/23/14 0916  . magnesium hydroxide (MILK OF MAGNESIA) suspension 30 mL  30 mL Oral Daily PRN Thompson Grayer, MD   30 mL at 06/23/14 1810  . metoprolol (LOPRESSOR) tablet 50 mg  50 mg Oral BID Peter M Martinique, MD   50 mg at 06/23/14 2127  . mupirocin ointment (BACTROBAN) 2 % 1 application  1 application Nasal BID Thompson Grayer, MD   1 application at 53/66/44 2127  . nitroGLYCERIN (NITROSTAT) SL tablet 0.4 mg  0.4 mg Sublingual Q5 min PRN Peter M Martinique, MD      . ondansetron [facility]) injection 4 mg  4 mg Intravenous Q6H PRN Peter M Martinique, MD      . pneumococcal 23 valent vaccine (PNU-IMMUNE) injection 0.5 mL  0.5 mL Intramuscular Tomorrow-1000 Thompson Grayer, MD        PE: General appearance: alert, cooperative and no distress Neck: no JVD and + radiaion of cardiac murmurs bilaterally Lungs: clear to auscultation bilaterally Heart: regular rate and rhythm and loud 3/6 SM throughout the precordium Extremities: no LEE Pulses: 2+ and symmetric Skin: warm and dry  Neurologic: Grossly normal  Lab Results:   Recent Labs  06/22/14 0429 06/23/14 0440 06/24/14 0535  WBC 8.0 7.8 8.4  HGB 12.0 12.3 12.1  HCT 36.3 37.6 38.2  PLT 131* 132* 136*   BMET  Recent Labs  06/22/14 0429  NA 138  K 4.2  CL 102  CO2 22  GLUCOSE 96  BUN 26*  CREATININE 1.31*  CALCIUM 8.8     Assessment/Plan  Active Problems:   Unstable angina   CAD (coronary artery disease)   Aortic stenosis  1. Severe AS: denies angina, dyspnea, syncope/ near syncope. Plan is for AVR.  2. CAD: denies angina. Plan is for 2V CABG. Continue IV heparin, ASA, BB, and statin. ?Holding ACE during there perioperative period. Will defer to surgery.   3. Poor  dentition: Plan for multiple dental extractions with Dr. Enrique Sack in the am prior to undergoing CABG + AVR.     LOS: 4 days    Carrie [name] 06/24/2014 9:08 AM   Patient seen and examined. Agree with assessment and plan. No recurrent chest pain; presyncope or CHF presently.  For dental extractions tomorrow prior to AVR/CABG later in the week.   Troy Sine, MD, [facility] 06/24/2014 9:19 AM

## 2014-06-24 NOTE — Progress Notes (Signed)
[place] for Heparin Indication: CAD awaiting AVR/CABG  Allergies  Allergen Reactions  . Morphine Nausea And Vomiting    ONLY IN IV FORM    Patient Measurements: Height: 5' 5.5" (166.4 cm) Weight: 174 lb 9.6 oz (79.198 kg) IBW/kg (Calculated) : 58.15  Vital Signs: Temp: 97.9 F (36.6 C) (08/03 0500) BP: 105/54 mmHg (08/03 0829) Pulse Rate: 54 (08/03 0829)  Labs:  Recent Labs  06/22/14 0429 06/23/14 0440 06/24/14 0535  HGB 12.0 12.3 12.1  HCT 36.3 37.6 38.2  PLT 131* 132* 136*  HEPARINUNFRC 0.49 0.47 0.50  CREATININE 1.31*  --   --     Estimated Creatinine Clearance: 45 ml/min (by C-G formula based on Cr of 1.31).   Medications:  Heparin 1200 units/hr   Assessment: 63 yo female with severe multivessel CAD continues on heparin while awaiting AVR/CABG. Heparin level remains therapeutic - will continue current rate. H/H wnl, Plts low but stable, no s/sx bleeding.   Goal of Therapy:  Heparin level 0.3-0.7 units/ml Monitor platelets by anticoagulation protocol: Yes    Plan:  1. Continue heparin 1200 units/hr  2. Daily heparin level and CBC 3. Monitor s/sx of bleeding with decreasing H/H and Plts    Hughes Better, PharmD, BCPS Clinical Pharmacist Pager: [phone] 06/24/2014 9:04 AM

## 2014-06-24 NOTE — Progress Notes (Signed)
CARDIAC REHAB PHASE I   PRE:  Rate/Rhythm: 51 SB  BP:  Supine:   Sitting: 120/70  Standing:    SaO2: 98 RA  MODE:  Ambulation: 550 ft   POST:  Rate/Rhythm: 62  BP:  Supine:   Sitting: 120/74  Standing:    SaO2: 98 RA 1355-1428 Assisted X 1 to ambulate. Pt able to walk 550 feet with two standing rest stops. She c/o of back pain which she has at home. At end of walk and in her room, she c/o of slight chest discomfort that went away with rest. VS stable Pt sitting on side of bed with call light in reach. She denies any chest discomfort when I left her room.  Rodney Langton RN 06/24/2014 2:32 PM

## 2014-06-24 NOTE — Progress Notes (Signed)
Patient ID: Carrie [name], female   DOB: 27-Oct-1951, 63 y.o.   MRN: [id number]      [address]       [place] [address]             [phone]                 4 Days Post-Op Procedure(s) (LRB): LEFT HEART CATHETERIZATION WITH CORONARY ANGIOGRAM ([name])  LOS: 4 days   Subjective: Dr Lucianne Lei Trigt's consult reviewed and patient seen, patient has no complaints today. For teeth extraction tomorrow.   Objective: Vital signs in last 24 hours: Patient Vitals for the past 24 hrs:  BP Temp Pulse Resp SpO2 Weight  06/24/14 0906 - - 55 16 98 % -  06/24/14 0829 105/54 mmHg - 54 - - -  06/24/14 0500 140/92 mmHg 97.9 F (36.6 C) 54 16 99 % 174 lb 9.6 oz (79.198 kg)  06/23/14 2110 - - - - 95 % -  06/23/14 2100 128/45 mmHg 97.8 F (36.6 C) 55 16 98 % -    Filed Weights   06/20/14 1220 06/21/14 0500 06/24/14 0500  Weight: 176 lb (79.833 kg) 175 lb 4.8 oz (79.516 kg) 174 lb 9.6 oz (79.198 kg)    Hemodynamic parameters for last 24 hours:    Intake/Output from previous day: 08/02 0701 - 08/03 0700 In: 720 [P.O.:720] Out: 675 [Urine:675] Intake/Output this shift: Total I/O In: 240 [P.O.:240] Out: -   Scheduled Meds: . aspirin EC  81 mg Oral Daily  . atorvastatin  80 mg Oral q1800  . budesonide-formoterol  2 puff Inhalation BID  . Chlorhexidine Gluconate Cloth  6 each Topical Daily  . lisinopril  40 mg Oral Daily  . metoprolol  50 mg Oral BID  . mupirocin ointment  1 application Nasal BID  . pneumococcal 23 valent vaccine  0.5 mL Intramuscular Tomorrow-1000   Continuous Infusions: . heparin 1,200 Units/hr (06/23/14 1927)   PRN Meds:.acetaminophen, alum & mag hydroxide-simeth, docusate sodium, HYDROcodone-acetaminophen, magnesium hydroxide, nitroGLYCERIN, ondansetron (ZOFRAN) IV  General appearance: alert and cooperative Neurologic: intact Heart: systolic murmur: holosystolic 4/6, crescendo throughout the precordium Lungs: clear to auscultation  bilaterally Abdomen: soft, non-tender; bowel sounds normal; no masses,  no organomegaly Extremities: extremities normal, atraumatic, no cyanosis or edema and palpable 2+ dp and pt bilaterial adquate vein in legs  Lab Results: CBC: Recent Labs  06/23/14 0440 06/24/14 0535  WBC 7.8 8.4  HGB 12.3 12.1  HCT 37.6 38.2  PLT 132* 136*   BMET:  Recent Labs  06/22/14 0429  NA 138  K 4.2  CL 102  CO2 22  GLUCOSE 96  BUN 26*  CREATININE 1.31*  CALCIUM 8.8    PT/INR: No results found for this basename: LABPROT, INR,  in the last 72 hours   Radiology No results found.   Assessment/Plan: S/P Procedure(s) (LRB): LEFT HEART CATHETERIZATION WITH CORONARY ANGIOGRAM ([name]) Dental extraction tomorrow, Poss AVR, CABG Friday I have discussed tissue vs metallic vales with her and use of coumadin. Likely she will opt for tissue valve.   Grace Isaac MD 06/24/2014 1:25 PM

## 2014-06-25 ENCOUNTER — Encounter (HOSPITAL_COMMUNITY): Payer: Medicare Other | Admitting: Anesthesiology

## 2014-06-25 ENCOUNTER — Encounter (HOSPITAL_COMMUNITY)
Admission: RE | Disposition: A | Discharge status: Home Health | Payer: Self-pay | Source: Ambulatory Visit | Attending: Internal Medicine

## 2014-06-25 ENCOUNTER — Encounter (HOSPITAL_COMMUNITY): Payer: Self-pay | Admitting: Anesthesiology

## 2014-06-25 ENCOUNTER — Inpatient Hospital Stay (HOSPITAL_COMMUNITY): Payer: Medicare Other | Admitting: Anesthesiology

## 2014-06-25 DIAGNOSIS — K036 Deposits [accretions] on teeth: Secondary | ICD-10-CM | POA: Diagnosis present

## 2014-06-25 DIAGNOSIS — K045 Chronic apical periodontitis: Secondary | ICD-10-CM | POA: Diagnosis present

## 2014-06-25 DIAGNOSIS — K029 Dental caries, unspecified: Secondary | ICD-10-CM | POA: Diagnosis present

## 2014-06-25 DIAGNOSIS — K083 Retained dental root: Secondary | ICD-10-CM | POA: Diagnosis present

## 2014-06-25 HISTORY — PX: MULTIPLE EXTRACTIONS WITH ALVEOLOPLASTY: SHX5342

## 2014-06-25 LAB — CBC
HCT: 37.9 % (ref 36.0–46.0)
Hemoglobin: 12.5 g/dL (ref 12.0–15.0)
MCH: 30.6 pg (ref 26.0–34.0)
MCHC: 33 g/dL (ref 30.0–36.0)
MCV: 92.9 fL (ref 78.0–100.0)
Platelets: 154 10*3/uL (ref 150–400)
RBC: 4.08 MIL/uL (ref 3.87–5.11)
RDW: 14.1 % (ref 11.5–15.5)
WBC: 10 10*3/uL (ref 4.0–10.5)

## 2014-06-25 SURGERY — MULTIPLE EXTRACTION WITH ALVEOLOPLASTY
Anesthesia: General | Site: Mouth

## 2014-06-25 MED ORDER — BUPIVACAINE-EPINEPHRINE (PF) 0.5% -1:200000 IJ SOLN
INTRAMUSCULAR | Status: AC
  Filled 2014-06-25: qty 3.6

## 2014-06-25 MED ORDER — SODIUM POLYSTYRENE SULFONATE 15 GM/60ML PO SUSP: 60.0000 g | Freq: Once | ORAL | Status: DC

## 2014-06-25 MED ORDER — AMINOCAPROIC ACID SOLUTION 5% (50 MG/ML)
100.0000 mL | Freq: Once | ORAL | Status: DC
  Filled 2014-06-25: qty 100

## 2014-06-25 MED ORDER — SODIUM CHLORIDE 0.9 % IV SOLN
INTRAVENOUS | Status: DC | PRN
Start: 2014-06-25 — End: 2014-06-25
  Administered 2014-06-25: 12:00:00 via INTRAVENOUS

## 2014-06-25 MED ORDER — PROPOFOL 10 MG/ML IV BOLUS
INTRAVENOUS | Status: AC
Start: 2014-06-25 — End: 2014-06-25
  Filled 2014-06-25: qty 20

## 2014-06-25 MED ORDER — SODIUM CHLORIDE 0.9 % IV SOLN: INTRAVENOUS | Status: DC

## 2014-06-25 MED ORDER — LIDOCAINE HCL (CARDIAC) 20 MG/ML IV SOLN
INTRAVENOUS | Status: DC | PRN
  Administered 2014-06-25: 100 mg via INTRAVENOUS

## 2014-06-25 MED ORDER — ARTIFICIAL TEARS OP OINT
TOPICAL_OINTMENT | OPHTHALMIC | Status: DC | PRN
  Administered 2014-06-25: 1 via OPHTHALMIC

## 2014-06-25 MED ORDER — HYDROMORPHONE HCL PF 1 MG/ML IJ SOLN
INTRAMUSCULAR | Status: AC
  Filled 2014-06-25: qty 1

## 2014-06-25 MED ORDER — HEPARIN (PORCINE) IN NACL 100-0.45 UNIT/ML-% IJ SOLN
1200.0000 [IU]/h | INTRAMUSCULAR | Status: DC
  Administered 2014-06-26: 1200 [IU]/h via INTRAVENOUS
  Filled 2014-06-25 (×3): qty 250

## 2014-06-25 MED ORDER — HEMOSTATIC AGENTS (NO CHARGE) OPTIME
TOPICAL | Status: DC | PRN
  Administered 2014-06-25 (×2): 1 via TOPICAL

## 2014-06-25 MED ORDER — BUPIVACAINE-EPINEPHRINE 0.5% -1:200000 IJ SOLN
INTRAMUSCULAR | Status: DC | PRN
  Administered 2014-06-25: 1.7 mL

## 2014-06-25 MED ORDER — PHENYLEPHRINE 40 MCG/ML (10ML) SYRINGE FOR IV PUSH (FOR BLOOD PRESSURE SUPPORT)
PREFILLED_SYRINGE | INTRAVENOUS | Status: AC
  Filled 2014-06-25: qty 10

## 2014-06-25 MED ORDER — ROCURONIUM BROMIDE 100 MG/10ML IV SOLN
INTRAVENOUS | Status: DC | PRN
  Administered 2014-06-25: 20 mg via INTRAVENOUS

## 2014-06-25 MED ORDER — GLYCOPYRROLATE 0.2 MG/ML IJ SOLN
INTRAMUSCULAR | Status: DC | PRN
  Administered 2014-06-25: 0.2 mg via INTRAVENOUS
  Administered 2014-06-25: 0.6 mg via INTRAVENOUS

## 2014-06-25 MED ORDER — PROPOFOL 10 MG/ML IV BOLUS
INTRAVENOUS | Status: DC | PRN
  Administered 2014-06-25: 160 mg via INTRAVENOUS
  Administered 2014-06-25 (×2): 10 mg via INTRAVENOUS

## 2014-06-25 MED ORDER — LIDOCAINE HCL (CARDIAC) 20 MG/ML IV SOLN
INTRAVENOUS | Status: AC
  Filled 2014-06-25: qty 5

## 2014-06-25 MED ORDER — SUCCINYLCHOLINE CHLORIDE 20 MG/ML IJ SOLN
INTRAMUSCULAR | Status: DC | PRN
  Administered 2014-06-25: 100 mg via INTRAVENOUS

## 2014-06-25 MED ORDER — OXYCODONE-ACETAMINOPHEN 5-325 MG PO TABS
1.0000 | ORAL_TABLET | ORAL | Status: DC | PRN
  Administered 2014-06-25 – 2014-06-26 (×3): 1 via ORAL
  Administered 2014-06-26 – 2014-06-27 (×6): 2 via ORAL
  Administered 2014-06-27: 1 via ORAL
  Administered 2014-06-27 – 2014-06-28 (×4): 2 via ORAL
  Filled 2014-06-25 (×4): qty 2
  Filled 2014-06-25: qty 1
  Filled 2014-06-25 (×2): qty 2
  Filled 2014-06-25 (×2): qty 1
  Filled 2014-06-25 (×4): qty 2
  Filled 2014-06-25: qty 1

## 2014-06-25 MED ORDER — ONDANSETRON HCL 4 MG/2ML IJ SOLN
INTRAMUSCULAR | Status: DC | PRN
  Administered 2014-06-25: 4 mg via INTRAVENOUS

## 2014-06-25 MED ORDER — ONDANSETRON HCL 4 MG/2ML IJ SOLN
INTRAMUSCULAR | Status: AC
  Filled 2014-06-25: qty 2

## 2014-06-25 MED ORDER — NEOSTIGMINE METHYLSULFATE 10 MG/10ML IV SOLN
INTRAVENOUS | Status: AC
  Filled 2014-06-25: qty 1

## 2014-06-25 MED ORDER — OXYCODONE-ACETAMINOPHEN 5-325 MG PO TABS
ORAL_TABLET | ORAL | Status: AC
  Filled 2014-06-25: qty 1

## 2014-06-25 MED ORDER — PHENYLEPHRINE HCL 10 MG/ML IJ SOLN
10.0000 mg | INTRAMUSCULAR | Status: DC | PRN
  Administered 2014-06-25: 80 ug/min via INTRAVENOUS

## 2014-06-25 MED ORDER — FENTANYL CITRATE 0.05 MG/ML IJ SOLN
INTRAMUSCULAR | Status: AC
  Filled 2014-06-25: qty 5

## 2014-06-25 MED ORDER — FENTANYL CITRATE 0.05 MG/ML IJ SOLN
INTRAMUSCULAR | Status: DC | PRN
  Administered 2014-06-25 (×2): 50 ug via INTRAVENOUS
  Administered 2014-06-25: 25 ug via INTRAVENOUS

## 2014-06-25 MED ORDER — LACTATED RINGERS IV SOLN: INTRAVENOUS | Status: DC

## 2014-06-25 MED ORDER — ONDANSETRON HCL 4 MG/2ML IJ SOLN
4.0000 mg | Freq: Once | INTRAMUSCULAR | Status: DC | PRN
Start: 2014-06-25 — End: 2014-06-25

## 2014-06-25 MED ORDER — BOOST / RESOURCE BREEZE PO LIQD
1.0000 | Freq: Three times a day (TID) | ORAL | Status: DC
  Administered 2014-06-25 – 2014-06-27 (×5): 1 via ORAL

## 2014-06-25 MED ORDER — GLYCOPYRROLATE 0.2 MG/ML IJ SOLN
INTRAMUSCULAR | Status: AC
  Filled 2014-06-25: qty 3

## 2014-06-25 MED ORDER — SODIUM CHLORIDE 0.9 % IV SOLN
INTRAVENOUS | Status: DC
  Administered 2014-06-25 (×2): via INTRAVENOUS

## 2014-06-25 MED ORDER — NEOSTIGMINE METHYLSULFATE 10 MG/10ML IV SOLN
INTRAVENOUS | Status: DC | PRN
  Administered 2014-06-25: 4 mg via INTRAVENOUS

## 2014-06-25 MED ORDER — LIDOCAINE-EPINEPHRINE 2 %-1:100000 IJ SOLN
INTRAMUSCULAR | Status: DC | PRN
  Administered 2014-06-25 (×4): 1.7 mL via INTRADERMAL

## 2014-06-25 MED ORDER — SUCCINYLCHOLINE CHLORIDE 20 MG/ML IJ SOLN
INTRAMUSCULAR | Status: AC
  Filled 2014-06-25: qty 1

## 2014-06-25 MED ORDER — 0.9 % SODIUM CHLORIDE (POUR BTL) OPTIME
TOPICAL | Status: DC | PRN
  Administered 2014-06-25: 1000 mL

## 2014-06-25 MED ORDER — ROCURONIUM BROMIDE 50 MG/5ML IV SOLN
INTRAVENOUS | Status: AC
Start: 2014-06-25 — End: 2014-06-25
  Filled 2014-06-25: qty 1

## 2014-06-25 MED ORDER — PHENYLEPHRINE HCL 10 MG/ML IJ SOLN
INTRAMUSCULAR | Status: DC | PRN
  Administered 2014-06-25 (×2): 80 ug via INTRAVENOUS

## 2014-06-25 MED ORDER — HYDROMORPHONE HCL PF 1 MG/ML IJ SOLN
0.2500 mg | INTRAMUSCULAR | Status: DC | PRN
  Administered 2014-06-25 (×2): 0.5 mg via INTRAVENOUS
  Administered 2014-06-25: 0.25 mg via INTRAVENOUS

## 2014-06-25 MED ORDER — ARTIFICIAL TEARS OP OINT
TOPICAL_OINTMENT | OPHTHALMIC | Status: AC
Start: 2014-06-25 — End: 2014-06-25
  Filled 2014-06-25: qty 3.5

## 2014-06-25 SURGICAL SUPPLY — 35 items
ALCOHOL 70% 16 OZ (MISCELLANEOUS) ×3 IMPLANT
ATTRACTOMAT 16X20 MAGNETIC DRP (DRAPES) ×3 IMPLANT
BLADE SURG 15 STRL LF DISP TIS (BLADE) ×2 IMPLANT
BLADE SURG 15 STRL SS (BLADE) ×6
COVER SURGICAL LIGHT HANDLE (MISCELLANEOUS) ×3 IMPLANT
GAUZE PACKING FOLDED 2  STR (GAUZE/BANDAGES/DRESSINGS) ×2
GAUZE PACKING FOLDED 2 STR (GAUZE/BANDAGES/DRESSINGS) ×1 IMPLANT
GAUZE SPONGE 4X4 16PLY XRAY LF (GAUZE/BANDAGES/DRESSINGS) ×3 IMPLANT
GLOVE BIOGEL PI IND STRL 6 (GLOVE) ×1 IMPLANT
GLOVE BIOGEL PI INDICATOR 6 (GLOVE) ×2
GLOVE SURG ORTHO 8.0 STRL STRW (GLOVE) ×3 IMPLANT
GLOVE SURG SS PI 6.0 STRL IVOR (GLOVE) ×3 IMPLANT
GOWN STRL REUS W/ TWL LRG LVL3 (GOWN DISPOSABLE) ×1 IMPLANT
GOWN STRL REUS W/TWL 2XL LVL3 (GOWN DISPOSABLE) ×3 IMPLANT
GOWN STRL REUS W/TWL LRG LVL3 (GOWN DISPOSABLE) ×3
HEMOSTAT SURGICEL 2X14 (HEMOSTASIS) ×2 IMPLANT
KIT BASIN OR (CUSTOM PROCEDURE TRAY) ×3 IMPLANT
KIT ROOM TURNOVER OR (KITS) ×3 IMPLANT
MANIFOLD NEPTUNE WASTE (CANNULA) ×3 IMPLANT
NDL BLUNT 16X1.5 OR ONLY (NEEDLE) ×1 IMPLANT
NEEDLE BLUNT 16X1.5 OR ONLY (NEEDLE) ×3 IMPLANT
NS IRRIG 1000ML POUR BTL (IV SOLUTION) ×3 IMPLANT
PACK EENT II TURBAN DRAPE (CUSTOM PROCEDURE TRAY) ×3 IMPLANT
PAD ARMBOARD 7.5X6 YLW CONV (MISCELLANEOUS) ×3 IMPLANT
SPONGE GAUZE 4X4 12PLY STER LF (GAUZE/BANDAGES/DRESSINGS) ×2 IMPLANT
SPONGE SURGIFOAM ABS GEL 100 (HEMOSTASIS) IMPLANT
SPONGE SURGIFOAM ABS GEL 12-7 (HEMOSTASIS) ×2 IMPLANT
SPONGE SURGIFOAM ABS GEL SZ50 (HEMOSTASIS) IMPLANT
SUCTION FRAZIER TIP 10 FR DISP (SUCTIONS) ×3 IMPLANT
SUT CHROMIC 3 0 PS 2 (SUTURE) ×6 IMPLANT
SYR 50ML SLIP (SYRINGE) ×3 IMPLANT
TOWEL OR 17X26 10 PK STRL BLUE (TOWEL DISPOSABLE) ×3 IMPLANT
TUBE CONNECTING 12'X1/4 (SUCTIONS) ×1
TUBE CONNECTING 12X1/4 (SUCTIONS) ×2 IMPLANT
YANKAUER SUCT BULB TIP NO VENT (SUCTIONS) ×3 IMPLANT

## 2014-06-25 NOTE — Anesthesia Procedure Notes (Signed)
Procedure Name: Intubation Date/Time: 06/25/2014 10:40 AM Performed by: Terrill Mohr Pre-anesthesia Checklist: Patient identified, Emergency Drugs available, Suction available and Patient being monitored Patient Re-evaluated:Patient Re-evaluated prior to inductionOxygen Delivery Method: Circle system utilized Preoxygenation: Pre-oxygenation with 100% oxygen Intubation Type: IV induction Ventilation: Mask ventilation without difficulty and Nasal airway inserted- appropriate to patient size Laryngoscope Size: Mac and 3 Grade View: Grade II Tube type: Oral Nasal Tubes: Right, Nasal Rae, Magill forceps- large, utilized and Nasal prep performed Number of attempts: 1 Placement Confirmation: ETT inserted through vocal cords under direct vision,  breath sounds checked- equal and bilateral and positive ETCO2 Tube secured with: taped across gauze pads on forehead. Dental Injury: Teeth and Oropharynx as per pre-operative assessment  Comments: After induction, #26 then #28 Fr. NPT lubed placed into right nare for 10 seconds or so.Marland KitchenMarland KitchenThen Nasal RAE passed ith red rubber robinson catheter attached to cuff end.  Red rubber cath caught at back of pharynx and pulled out via mouth to advance tip of RAE into area of vallecula

## 2014-06-25 NOTE — Progress Notes (Signed)
PRE-OPERATIVE NOTE:  06/25/2014 LEMA HEINKEL [id number]  VITALS: BP 148/56  Pulse 52  Temp(Src) 98.1 F (36.7 C) (Oral)  Resp 16  Ht 5' 5.5" (1.664 m)  Wt 174 lb (78.926 kg)  BMI 28.50 kg/m2  SpO2 98%  Lab Results  Component Value Date   WBC 10.0 06/25/2014   HGB 12.5 06/25/2014   HCT 37.9 06/25/2014   MCV 92.9 06/25/2014   PLT 154 06/25/2014    BMET    Component Value Date/Time   NA 138 06/22/2014 0429   K 4.2 06/22/2014 0429   CL 102 06/22/2014 0429   CO2 22 06/22/2014 0429   GLUCOSE 96 06/22/2014 0429   BUN 26* 06/22/2014 0429   CREATININE 1.31* 06/22/2014 0429   CREATININE 1.35* 05/24/2014 1404   CALCIUM 8.8 06/22/2014 0429   GFRNONAA 42* 06/22/2014 0429   GFRAA 48* 06/22/2014 0429    Lab Results  Component Value Date   INR 1.0 06/19/2014   No results found for this basename: PTT     Lia [place] presents for multiple dental extractions with alveoloplasty and gross debridement of remaining dentition in the operating room with general anesthesia.    SUBJECTIVE: The patient denies any acute medical or dental changes and agrees to proceed with treatment as planned.  EXAM: No sign of acute dental changes.  ASSESSMENT: Patient is affected by chronic periodontitis, chronic apical periodontitis, retained root segment, dental caries, and accretions.  PLAN: Patient agrees to proceed with treatment as planned in the operating room as previously discussed and accepts the risks, benefits, complications of the proposed treatment. The patient understands potential risk for bleeding, bruising, swelling, infection, discomfort, soft tissue damage, root tip fracture, mandible fracture, sinus involvement requiring additional procedures, and risks of general anesthesia. The patient is also aware of potential cardiovascular and respiratory complications up to and including death due to her overall cardiovascular compromise. The patient is also aware of other potential complications not mentioned above.     Lenn Cal, DDS

## 2014-06-25 NOTE — Progress Notes (Addendum)
CARDIAC REHAB PHASE I   PRE:  Rate/Rhythm: 53 SB  BP:  Supine: 130/66  Sitting:   Standing:    SaO2: 99 RA  MODE:  Ambulation: 550 ft   POST:  Rate/Rhythm: 60  BP:  Supine:   Sitting: 120/66  Standing:    SaO2: 100 RA 6701-4103 Pt tolerated ambulation well this am. She took one standing rest stop due to back pain. Pt ask to walk 550 feet today. I was going to get her to stop due to her having some chest discomfort yesterday. She was able to walk the 550 feet today without any chest pain. Pt to side of bed after walk with call light in reach.  Rodney Langton RN 06/25/2014 9:21 AM

## 2014-06-25 NOTE — Op Note (Signed)
OPERATIVE REPORT  Patient:            Carrie [name] Date of Birth:  Nov 14, 1951 MRN:                [id number]  DATE OF PROCEDURE:  06/25/2014  PREOPERATIVE DIAGNOSES: 1. Aortic stenosis 2. Coronary artery disease 3. Pre-heart valve surgery dental protocol 4. Chronic apical periodontitis 5. Multiple retained root segments 6. Chronic periodontitis 7. Accretions  POSTOPERATIVE DIAGNOSES: 1. Aortic stenosis 2. Coronary artery disease 3. Pre-heart valve surgery dental protocol 4. Chronic apical periodontitis 5. Multiple retained root segments 6. Chronic periodontitis 7. Accretions  OPERATIONS: 1. Multiple extraction of tooth numbers 2, 3, 4, 14, 18, 19 2. 3 Quadrants of alveoloplasty 3. Gross debridement of remaining dentition   SURGEON: Lenn Cal, DDS  ASSISTANT: Camie Patience, (dental assistant)  ANESTHESIA: General anesthesia via nasoendotracheal tube.  MEDICATIONS: 1. Ancef 2 g IV prior to invasive dental procedures. 2. Local anesthesia with a total utilization of 4 carpules each containing 34 mg of lidocaine with 0.017 mg of epinephrine as well as 1 carpule each containing 9 mg of bupivacaine with 0.009 mg of epinephrine.  SPECIMENS: There are 6 teeth that were discarded.  DRAINS: None  CULTURES: None  COMPLICATIONS: None   ESTIMATED BLOOD LOSS: 75 mLs.  INTRAVENOUS FLUIDS: 1300 mL of normal saline solution.  INDICATIONS: The patient was recently diagnosed with coronary artery disease and severe aortic stenosis.  A dental consultation was then requested to evaluate poor dentition as part of a medically necessary pre-heart valve surgery dental protocol.  The patient was examined and treatment planned for multiple extractions with alveoloplasty and gross debridement of remaining dentition in the operating room with general anesthesia.  This treatment plan was formulated to decrease the risks and complications associated with dental infection from affecting the  patient's systemic health and anticipated aortic valve replacement and coronary artery bypass graft procedures.  OPERATIVE FINDINGS: Patient was examined in operating room number 7.  The teeth were identified for extraction. Tooth numbers 2, 3, retained root segments #4 and #14, 18 and 19 were to be extracted along with alveoloplasty and gross to the remaining dentition. The patient was noted be affected by chronic periodontitis, chronic apical periodontitis, dental caries, multiple retained root segments, and significant accretions.  DESCRIPTION OF PROCEDURE: Patient was brought to the main operating room number 7. Patient was then placed in the supine position on the operating table. General anesthesia was then induced per the anesthesia team. The patient was then prepped and draped in the usual manner for dental medicine procedure. A timeout was performed. The patient was identified and procedures were verified. A throat pack was placed at this time. The oral cavity was then thoroughly examined with the findings noted above. The patient was then ready for dental medicine procedure as follows:  Local anesthesia was then administered sequentially with a total utilization of 4 carpules each containing 34 mg of lidocaine with 0.017 mg of epinephrine as well as 1 carpules  each containing 9 mg bupivacaine with 0.009 mg of epinephrine.  The Maxillary left and right quadrants first approached. Anesthesia was then delivered utilizing infiltration with lidocaine with epinephrine. A #15 blade incision was then made from the maxillary right tuberosity and extended to the distal of #5.  A  surgical flap was then carefully reflected. Appropriate amounts of buccal and interseptal bone were then removed utilizing a surgical handpiece and bur and copious amounts of sterile water around  tooth numbers 2, 3, and 4.  The teeth were then subluxated with a series of straight elevators. Tooth numbers 2 and 3 were then removed  with a 53R  forceps without complications. Tooth number 4 was then removed with a 150 forceps without complications. Alveoloplasty was then performed utilizing a ronguers and bone file. The surgical site was then irrigated with copious amounts of sterile saline. The tissues were approximated and trimmed appropriately. A piece of Surgicel was placed in the extraction sockets appropriately. A layer of Surgifoam was then placed over the molar extraction sites. The surgical site was then closed from the maxillary right tuberosity and extended to the distal of #5 utilizing 3-0 chromic gut suture in a continuous interupted suture technique x1. 2 individual interrupted sutures were then placed to further close the surgical site.  At this point time the retained roots in the area of tooth #14 were approached. A 15 blade incision was made from the mesial of #15 extended to the distal of #13. A surgical flap was then carefully reflected. The retained roots were then noted and removed with a series of rongeurs. Alveoloplasty was performed utilizing a rongeur and bone file. The surgical site was then irrigated with copious of sterile saline. A piece of Surgicel was placed the extraction socket appropriately. A piece of Surgifoam was then placed over the Surgicel. The surgical site was then closed utilizing 3-0 chromic gut material in a continuous interrupted suture technique from the mesial #15 extended to the distal of #13.  At this point time the remaining maxillary dentition were approached and a gross debridement procedure was performed utilizing a sonic scaler. A series of hand curettes were utilized to further remove accretions as needed. A sonic scaler was then again used to refine removal of accretions further.  At this point time, the mandibular quadrants were approached. The patient was given a mandibular left inferior alveolar nerve block and long buccal nerve block utilizing the bupivacaine with epinephrine.  Further infiltration was then achieved utilizing the lidocaine with epinephrine. A 15 blade incision was then made from the distal of number 17 and extended to the mesial of #20. A surgical flap was then carefully reflected. Appropriate amounts of buccal and interseptal bone were then removed utilizing a surgical handpiece and copious amount of sterile water around tooth numbers 18 and 19. Tooth numbers 18 and 19 were then subluxated with a series straight elevators removing the coronal aspect secondary to gross caries.  Further bone was then removed utilizing a surgical handpiece and bur and copious amounts sterile water.  The retained roots were then elevated out with a series of cryers elevators without complication. Alveoloplasty was then performed utilizing a rongeurs and bone file. The tissues were approximated and trimmed appropriately. The surgical sites were then irrigated with copious amounts of sterile saline. The Surgicel was then placed in the extraction sockets appropriately. The mandibular left surgical site was then closed from the distal of  17 and extended to the mesial of #19.  3 individual interrupted sutures were then placed with 3-0 chromic gut material to further close the surgical site as needed.  At this point time the remaining mandibular dentition was approached. A sonic scaler was used to remove significant accretions. A series of hand curettes were utilized refine removal of accretions. A sonic scaler was then again used to further refine removal of accretions.   At this point time, the entire mouth was irrigated with copious amounts of sterile saline.  The patient was examined for complications, seeing none, the dental medicine procedure was deemed to be complete. The throat pack was removed at this time. A series of 4 x 4 gauze soaked without 5% Amicar rinse were placed in the mouth to aid hemostasis. The patient was then handed over to the anesthesia team for final disposition. After  an appropriate amount of time, the patient was extubated and taken to the postanesthsia care unit with stable vital signs and a good condition. All counts were correct for the dental medicine procedure. The patient will be kept off heparin therapy until 12 :30 a.m on Wednesday morning. The heparin therapy may be restarted at that time with if no significant heme is present. Heparin to be restarted with NO bolus.  The patient should be ready for proceeding with aortic valve replacement/coronary artery bypass graft procedure in 2-3 days at the discretion of the cardiothoracic surgeon.   Lenn Cal, DDS.

## 2014-06-25 NOTE — Anesthesia Preprocedure Evaluation (Addendum)
Anesthesia Evaluation  Patient identified by MRN, date of birth, ID band Patient awake    Reviewed: Allergy & Precautions, H&P , NPO status , Patient's Chart, lab work & pertinent test results, reviewed documented beta blocker date and time   Airway Mallampati: II TM Distance: >3 FB Neck ROM: Full    Dental  (+) Missing, Poor Dentition, Dental Advisory Given   Pulmonary Current Smoker,  Quit cigs last week; was smoking half pack per day         Cardiovascular hypertension, Pt. on home beta blockers and Pt. on medications + angina with exertion and at rest + CAD + dysrhythmias + Valvular Problems/Murmurs AS  Would get resting pain after working job...   Neuro/Psych  Neuromuscular disease    GI/Hepatic   Endo/Other    Renal/GU      Musculoskeletal   Abdominal   Peds  Hematology   Anesthesia Other Findings   Reproductive/Obstetrics                          Anesthesia Physical Anesthesia Plan  ASA: III  Anesthesia Plan: General   Post-op Pain Management:    Induction: Intravenous  Airway Management Planned: Oral ETT  Additional Equipment:   Intra-op Plan:   Post-operative Plan: Extubation in OR  Informed Consent: I have reviewed the patients History and Physical, chart, labs and discussed the procedure including the risks, benefits and alternatives for the proposed anesthesia with the patient or authorized representative who has indicated his/her understanding and acceptance.     Plan Discussed with: CRNA, Anesthesiologist and Surgeon  Anesthesia Plan Comments:         Anesthesia Quick Evaluation

## 2014-06-25 NOTE — Anesthesia Postprocedure Evaluation (Signed)
  Anesthesia Post-op Note  Patient: Carrie [name]  Procedure(s) Performed: Procedure(s): Extraction of tooth #'s 2,3,4,14, 18, 19 with alveoloplasty and gross debridement of remaining teeth ([name])  Patient Location: PACU  Anesthesia Type:General  Level of Consciousness: awake, alert  and oriented  Airway and Oxygen Therapy: Patient Spontanous Breathing and Patient connected to nasal cannula oxygen  Post-op Pain: mild  Post-op Assessment: Post-op Vital signs reviewed, Patient's Cardiovascular Status Stable, Respiratory Function Stable, Patent Airway and Pain level controlled  Post-op Vital Signs: stable  Last Vitals:  Filed Vitals:   06/25/14 1424  BP: 181/51  Pulse: 50  Temp: 36.3 C  Resp: 16    Complications: No apparent anesthesia complications

## 2014-06-25 NOTE — Progress Notes (Signed)
INITIAL NUTRITION ASSESSMENT  DOCUMENTATION CODES Per approved criteria  -Not Applicable   INTERVENTION:  Resource Breeze PO TID, each supplement provides 250 kcal and 9 grams of protein  Recommend full liquid diet, advance as tolerated to soft diet over the next 24-48 hours.  NUTRITION DIAGNOSIS: Inadequate oral intake related to dysphagia from recent extraction of 6 teeth as evidenced by clear liquid diet.   Goal: Intake to meet >90% of estimated nutrition needs.  Monitor:  Diet advancement, PO intake, labs, weight trend.  Reason for Assessment: MD Consult  63 y.o. female  Admitting Dx: S/P left heart catheterization with coronary angiogram 7/30  ASSESSMENT: 63 y.o. female with a h/o hyperlipidemia, hypertension, and ongoing tobacco use who presents for cardiac surgery with a diagnosis of chest pain and severe aortic stenosis.  S/P dental extractions x 6 earlier today in preparation for open heart surgery later this week. Patient reports that she has been eating well PTA, stable weight and good appetite PTA. Nutrition focused physical exam completed.  No muscle or subcutaneous fat depletion noticed.  Patient now with anticipated difficulty chewing due to dental extractions. She is currently on a clear liquid diet.  Height: Ht Readings from Last 1 Encounters:  06/20/14 5' 5.5" (1.664 m)    Weight: Wt Readings from Last 1 Encounters:  06/25/14 174 lb (78.926 kg)    Ideal Body Weight: 58 kg  % Ideal Body Weight: 136%  Wt Readings from Last 10 Encounters:  06/25/14 174 lb (78.926 kg)  06/25/14 174 lb (78.926 kg)  06/25/14 174 lb (78.926 kg)  06/14/14 176 lb (79.833 kg)  06/08/14 174 lb (78.926 kg)  05/24/14 173 lb 9.6 oz (78.744 kg)  06/17/11 166 lb (75.297 kg)  06/18/10 171 lb (77.565 kg)  02/12/10 170 lb (77.111 kg)  09/03/09 171 lb (77.565 kg)    Usual Body Weight: 174 lb  % Usual Body Weight: 100%  BMI:  Body mass index is 28.5 kg/(m^2).  Overweight  Estimated Nutritional Needs: Kcal: 1700-1900 Protein: 85-100 gm Fluid: 1.8 L  Skin: no issues  Diet Order: Clear Liquid  EDUCATION NEEDS: -Education needs addressed, discussed ways to increase intake of protein and calories with liquid supplements while mouth is healing.   Intake/Output Summary (Last 24 hours) at 06/25/14 1609 Last data filed at 06/25/14 1230  Gross per 24 hour  Intake   1300 ml  Output    750 ml  Net    550 ml    Last BM: 8/3   Labs:   Recent Labs Lab 06/19/14 1052 06/22/14 0429  NA 139 138  K 4.2 4.2  CL 107 102  CO2 27 22  BUN 26* 26*  CREATININE 1.5* 1.31*  CALCIUM 9.4 8.8  GLUCOSE 95 96    CBG (last 3)  No results found for this basename: GLUCAP,  in the last 72 hours  Scheduled Meds: . aspirin EC  81 mg Oral Daily  . atorvastatin  80 mg Oral q1800  . Chlorhexidine Gluconate Cloth  6 each Topical Daily  . HYDROmorphone      . HYDROmorphone      . lisinopril  40 mg Oral Daily  . metoprolol  50 mg Oral BID  . mupirocin ointment  1 application Nasal BID    Continuous Infusions: . sodium chloride    . [START ON 06/26/2014] heparin      Past Medical History  Diagnosis Date  . Hypertension   . Hyperlipidemia   . Carotid  bruit   . Sinus bradycardia   . Palpitations   . Tobacco abuse   . Heart murmur   . DDD (degenerative disc disease), lumbar   . Chronic lower back pain   . Coronary artery disease     Past Surgical History  Procedure Laterality Date  . Heel spur surgery Right   . Cataract extraction w/ intraocular lens  implant, bilateral Bilateral ~ 02/2014  . Hip orif w/ capsulotomy Left 1980's  . Tonsillectomy  1978  . Cardiac catheterization  06/20/2014  . Abdominal hysterectomy  1970's    Molli Barrows, [place], LDN, [place] Pager [phone] After Hours Pager [phone]

## 2014-06-25 NOTE — Progress Notes (Signed)
[place] for Heparin Indication: CAD awaiting AVR/CABG  Allergies  Allergen Reactions  . Morphine Nausea And Vomiting    ONLY IN IV FORM    Patient Measurements: Height: 5' 5.5" (166.4 cm) Weight: 174 lb (78.926 kg) IBW/kg (Calculated) : 58.15  Vital Signs: Temp: 98.1 F (36.7 C) (08/04 0519) Temp src: Oral (08/04 0519) BP: 122/60 mmHg (08/04 0934) Pulse Rate: 54 (08/04 0934)  Labs:  Recent Labs  06/23/14 0440 06/24/14 0535 06/25/14 0615  HGB 12.3 12.1 12.5  HCT 37.6 38.2 37.9  PLT 132* 136* 154  HEPARINUNFRC 0.47 0.50  --     Estimated Creatinine Clearance: 44.9 ml/min (by C-G formula based on Cr of 1.31).   Medications:  Heparin 1200 units/hr   Assessment: 63 yo female with severe multivessel CAD continues on heparin while awaiting AVR/CABG. Heparin is currently off as pt is due for oral procedure today. H/H wnl, Plts low but stable, no s/sx bleeding.   Goal of Therapy:  Heparin level 0.3-0.7 units/ml Monitor platelets by anticoagulation protocol: Yes    Plan:  1. Heparin has been stopped - anticipate resumption of heparin 12 hours following procedure 2. Daily heparin level and CBC 3. Monitor s/sx of bleeding   Hughes Better, PharmD, BCPS Clinical Pharmacist Pager: [phone] 06/25/2014 9:42 AM

## 2014-06-25 NOTE — Progress Notes (Signed)
     Patient Name: Carrie [name] Date of Encounter: 06/25/2014  Active Problems:   Unstable angina   CAD (coronary artery disease)   Aortic stenosis    Patient Profile: 63 yo female w/ hx AS, TOB, HTN, HLD, had cath as OP for chest pain, 3V dz, admitted after for surgery consult. TCTS has seen, AS is now severe. CABG and AVR planned for 08/07. Dental extractions 08/04.   SUBJECTIVE: Jaw hurts, trying not to talk. Waiting for surgery.  OBJECTIVE Filed Vitals:   06/25/14 1300 06/25/14 1315 06/25/14 1330 06/25/14 1424  BP: 182/62 181/67 167/73 181/51  Pulse: 51 50 59 50  Temp:   97.2 F (36.2 C) 97.4 F (36.3 C)  TempSrc:    Oral  Resp: 17 20 15 16   Height:      Weight:      SpO2: 99% 94% 97% 100%    Intake/Output Summary (Last 24 hours) at 06/25/14 1557 Last data filed at 06/25/14 1230  Gross per 24 hour  Intake   1300 ml  Output    750 ml  Net    550 ml   Filed Weights   06/21/14 0500 06/24/14 0500 06/25/14 0519  Weight: 175 lb 4.8 oz (79.516 kg) 174 lb 9.6 oz (79.198 kg) 174 lb (78.926 kg)    PHYSICAL EXAM General: Well developed, well nourished, female in no acute distress. Head: Normocephalic, atraumatic.  Neck: Supple without bruits, JVD not elevated. Lungs:  Resp regular and unlabored, very few rales. Heart: RRR, S1, S2, no S3, S4, 3/6 murmur; no rub. Abdomen: Soft, non-tender, non-distended, BS + x 4.  Extremities: No clubbing, cyanosis, no edema.  Neuro: Alert and oriented X 3. Moves all extremities spontaneously. Psych: Normal affect.  LABS: CBC: Recent Labs  06/24/14 0535 06/25/14 0615  WBC 8.4 10.0  HGB 12.1 12.5  HCT 38.2 37.9  MCV 94.1 92.9  PLT 136* 154    TELE: SR, ST, no significant ectopy      Current Medications:  . aspirin EC  81 mg Oral Daily  . atorvastatin  80 mg Oral q1800  . Chlorhexidine Gluconate Cloth  6 each Topical Daily  . HYDROmorphone      . HYDROmorphone      . lisinopril  40 mg Oral Daily  . metoprolol  50 mg  Oral BID  . mupirocin ointment  1 application Nasal BID   . sodium chloride    . [START ON 06/26/2014] heparin      ASSESSMENT AND PLAN: Active Problems:   Unstable angina - cath w/ 3 v dz, CABG/AVR planned for 08/07. Continue ASA, BB, ACE, statin. EF 70%.     CAD (coronary artery disease) - see above, ck lipids in am    Aortic stenosis - severe by echo this admit.     Dental Issues - chronic periodontitis, accretions with extraction of 5 teeth 08/04. Per Dr. Enrique Sack.  Plan - continue current care, surgery Friday.  Signed, Rosaria Ferries , PA-C 3:57 PM 06/25/2014   Patient seen and examined. Agree with assessment and plan. No chest pain or sob. Tolerated multiple extraction of tooth numbers 2, 3, 4, 14, 18, 19; 3 quadrants of alveoloplasty and gross debridement of remaining dentition. For AVR/CABG later in week.    Troy Sine, MD, [facility] 06/25/2014 5:14 PM

## 2014-06-25 NOTE — Transfer of Care (Signed)
Immediate Anesthesia Transfer of Care Note  Patient: Carrie [name]  Procedure(s) Performed: Procedure(s): Extraction of tooth #'s 2,3,4,14, 18, 19 with alveoloplasty and gross debridement of remaining teeth ([name])  Patient Location: PACU  Anesthesia Type:General  Level of Consciousness: awake and alert   Airway & Oxygen Therapy: Patient Spontanous Breathing and Patient connected to nasal cannula oxygen  Post-op Assessment: Report given to PACU RN, Post -op Vital signs reviewed and stable and Patient moving all extremities  Post vital signs: Reviewed and stable  Complications: No apparent anesthesia complications

## 2014-06-26 LAB — HEPARIN LEVEL (UNFRACTIONATED)
Heparin Unfractionated: 0.16 IU/mL — ABNORMAL LOW (ref 0.30–0.70)
Heparin Unfractionated: 0.34 IU/mL (ref 0.30–0.70)
Heparin Unfractionated: 0.41 IU/mL (ref 0.30–0.70)

## 2014-06-26 LAB — CBC
HEMATOCRIT: 38.6 % (ref 36.0–46.0)
Hemoglobin: 12.1 g/dL (ref 12.0–15.0)
MCH: 30.3 pg (ref 26.0–34.0)
MCHC: 31.3 g/dL (ref 30.0–36.0)
MCV: 96.5 fL (ref 78.0–100.0)
Platelets: 123 10*3/uL — ABNORMAL LOW (ref 150–400)
RBC: 4 MIL/uL (ref 3.87–5.11)
RDW: 14.4 % (ref 11.5–15.5)
WBC: 11.8 10*3/uL — ABNORMAL HIGH (ref 4.0–10.5)

## 2014-06-26 MED ORDER — HEPARIN (PORCINE) IN NACL 100-0.45 UNIT/ML-% IJ SOLN
1350.0000 [IU]/h | INTRAMUSCULAR | Status: DC
  Administered 2014-06-27: 1350 [IU]/h via INTRAVENOUS
  Filled 2014-06-26 (×6): qty 250

## 2014-06-26 NOTE — Progress Notes (Signed)
Carrie [name] for Heparin Indication: CAD awaiting AVR/CABG  Allergies  Allergen Reactions  . Morphine Nausea And Vomiting    ONLY IN IV FORM    Patient Measurements: Height: 5' 5.5" (166.4 cm) Weight: 176 lb 14.4 oz (80.241 kg) IBW/kg (Calculated) : 58.15  Vital Signs: Temp: 98.2 F (36.8 C) (08/05 0754) Temp src: Oral (08/05 0754) BP: 133/51 mmHg (08/05 1031) Pulse Rate: 63 (08/05 1031)  Labs:  Recent Labs  06/24/14 0535 06/25/14 0615 06/26/14 0352 06/26/14 0845  HGB 12.1 12.5 12.1  --   HCT 38.2 37.9 38.6  --   PLT 136* 154 123*  --   HEPARINUNFRC 0.50  --   --  0.16*    Estimated Creatinine Clearance: 45.3 ml/min (by C-G formula based on Cr of 1.31).   Medications:  Heparin 1200 units/hr   Assessment: 63 yo female with severe multivessel CAD continues on heparin while awaiting AVR/CABG. H/H wnl, Plts low but stable, no s/sx bleeding.  Heparin turned off for ~ 12 hrs yesterday for dental extractions, resumed at 0130 this AM at previous rate.  F/u heparin level is now below goal (0.16).  Per discussion with RN, no issues with IV infusion since resuming gtt.  RN also reports mild mouth bleeding, but not any worse on IV heparin, and improving from yesterday.  Goal of Therapy:  Heparin level 0.3-0.7 units/ml Monitor platelets by anticoagulation protocol: Yes    Plan:  1. Increase IV heparin gtt to 1350 units/hr. 2. Recheck heparin level 6 hrs after gtt increased. 3. Daily heparin level and CBC. 4. F/u plans for OR (on Friday?)  Uvaldo Rising, BCPS  Clinical Pharmacist Pager [phone]  06/26/2014 10:36 AM

## 2014-06-26 NOTE — Progress Notes (Signed)
POST OPERATIVE NOTE:  06/26/2014 Carrie [name] [id number]  VITALS: BP 133/51  Pulse 63  Temp(Src) 98.2 F (36.8 C) (Oral)  Resp 15  Ht 5' 5.5" (1.664 m)  Wt 176 lb 14.4 oz (80.241 kg)  BMI 28.98 kg/m2  SpO2 94%  LABS:  Lab Results  Component Value Date   WBC 11.8* 06/26/2014   HGB 12.1 06/26/2014   HCT 38.6 06/26/2014   MCV 96.5 06/26/2014   PLT 123* 06/26/2014   BMET    Component Value Date/Time   NA 138 06/22/2014 0429   K 4.2 06/22/2014 0429   CL 102 06/22/2014 0429   CO2 22 06/22/2014 0429   GLUCOSE 96 06/22/2014 0429   BUN 26* 06/22/2014 0429   CREATININE 1.31* 06/22/2014 0429   CREATININE 1.35* 05/24/2014 1404   CALCIUM 8.8 06/22/2014 0429   GFRNONAA 42* 06/22/2014 0429   GFRAA 48* 06/22/2014 0429    Lab Results  Component Value Date   INR 1.0 06/19/2014   No results found for this basename: PTT     Patient is status post multiple extraction of tooth #'s 2, 3, 4, 14,18,19 with alveoloplasty and gross debridement of teeth.   SUBJECTIVE: Patient with minimal oral discomfort while on pain medications. Denies active bleeding. Had some bleeding from nose last night.  EXAM: No signs of oral infection, heme, or ooze. Suture and clots intact. Echymoses and swelling noted.  ASSESSMENT: Post operative course is consistent with dental procedures performed in the OR.   PLAN: 1. Advance diet as tolerated. Appreciate nutritional consult. 2. Pt. Cleared for AVR/CABG surgery with TCTS. 3. Patient to call for F/U appointment with /dental medicine once discharged from hospital.  Lenn Cal, DDS

## 2014-06-26 NOTE — Progress Notes (Signed)
ANTICOAGULATION CONSULT NOTE - Follow Up Consult  Pharmacy Consult for heparin Indication: CAD awaiting AVR/CABG  Labs:  Recent Labs  06/24/14 0535 06/25/14 0615 06/26/14 0352 06/26/14 0845 06/26/14 1705 06/26/14 2232  HGB 12.1 12.5 12.1  --   --   --   HCT 38.2 37.9 38.6  --   --   --   PLT 136* 154 123*  --   --   --   HEPARINUNFRC 0.50  --   --  0.16* 0.34 0.41    Assessment/Plan:  63yo female now therapeutic on heparin after rate increase. Will continue gtt at current rate and confirm stable with am labs.   Wynona Neat, PharmD, BCPS  06/26/2014,11:38 PM

## 2014-06-26 NOTE — Progress Notes (Signed)
[facility] for Heparin Indication: CAD awaiting AVR/CABG  Allergies  Allergen Reactions  . Morphine Nausea And Vomiting    ONLY IN IV FORM    Patient Measurements: Height: 5' 5.5" (166.4 cm) Weight: 176 lb 14.4 oz (80.241 kg) IBW/kg (Calculated) : 58.15  Vital Signs: Temp: 97.7 F (36.5 C) (08/05 1356) Temp src: Oral (08/05 1356) BP: 112/49 mmHg (08/05 1356) Pulse Rate: 48 (08/05 1356)  Labs:  Recent Labs  06/24/14 0535 06/25/14 0615 06/26/14 0352 06/26/14 0845 06/26/14 1705  HGB 12.1 12.5 12.1  --   --   HCT 38.2 37.9 38.6  --   --   PLT 136* 154 123*  --   --   HEPARINUNFRC 0.50  --   --  0.16* 0.34    Estimated Creatinine Clearance: 45.3 ml/min (by C-G formula based on Cr of 1.31).   Medications:  Heparin 1200 units/hr   Assessment: 63 yo female with severe multivessel CAD continues on heparin while awaiting AVR/CABG. H/H wnl, Plts low but stable, no s/sx bleeding.  Heparin turned off for ~ 12 hrs yesterday for dental extractions, resumed at 0130 this AM at previous rate.  F/u heparin level is now below goal (0.16).  Per discussion with RN, no issues with IV infusion since resuming gtt.  RN also reports mild mouth bleeding, but not any worse on IV heparin, and improving from yesterday.  Goal of Therapy:  Heparin level 0.3-0.7 units/ml Monitor platelets by anticoagulation protocol: Yes    Plan:  1. Increase IV heparin gtt to 1350 units/hr. 2. Recheck heparin level 6 hrs after gtt increased. 3. Daily heparin level and CBC. 4. F/u plans for OR (on Friday?)  Uvaldo Rising, BCPS  Clinical Pharmacist Pager [phone]  06/26/2014 6:40 PM     Addendum - Heparin level is therapeutic x1 - Continue current rate, recheck ~2300   Hughes Better, PharmD, BCPS Clinical Pharmacist Pager: [phone] 06/26/2014 6:40 PM

## 2014-06-26 NOTE — Progress Notes (Signed)
   Subjective:  Mild residual mouth soreness; no chest pain or sob  Objective:   Vital Signs in the last 24 hours: Temp:  [97.2 F (36.2 C)-98.5 F (36.9 C)] 98.2 F (36.8 C) (08/05 0754) Pulse Rate:  [50-59] 50 (08/05 0754) Resp:  [15-20] 15 (08/05 0754) BP: (122-182)/(50-73) 140/50 mmHg (08/05 0754) SpO2:  [88 %-100 %] 94 % (08/05 0754) FiO2 (%):  [2 %] 2 % (08/04 1424) Weight:  [176 lb 14.4 oz (80.241 kg)] 176 lb 14.4 oz (80.241 kg) (08/05 0400)  Intake/Output from previous day: 08/04 0701 - 08/05 0700 In: 1660 [P.O.:360; I.V.:1300] Out: 1050 [Urine:600; Blood:450]  Medications: . aspirin EC  81 mg Oral Daily  . atorvastatin  80 mg Oral q1800  . Chlorhexidine Gluconate Cloth  6 each Topical Daily  . feeding supplement (RESOURCE BREEZE)  1 Container Oral TID BM  . lisinopril  40 mg Oral Daily  . metoprolol  50 mg Oral BID    . sodium chloride    . heparin 1,200 Units/hr (06/26/14 0131)    Physical Exam:   General appearance: alert, cooperative and no distress Neck: no adenopathy, no JVD, supple, symmetrical, trachea midline and thyroid not enlarged, symmetric, no tenderness/mass/nodules Lungs: clear to auscultation bilaterally Heart: regular rate and rhythm and 2-3/6 AS murmut Abdomen: soft, non-tender; bowel sounds normal; no masses,  no organomegaly Extremities: no edema, redness or tenderness in the calves or thighs Skin: Skin color, texture, turgor normal. No rashes or lesions Neurologic: Grossly normal   Rate: 55  Rhythm: sinus bradycardia  Lab Results:  No results found for this basename: NA, K, CL, CO2, GLUCOSE, BUN, CREATININE,  in the last 72 hours  No results found for this basename: TROPONINI, CK, MB,  in the last 72 hours  Hepatic Function Panel No results found for this basename: PROT, ALBUMIN, AST, ALT, ALKPHOS, BILITOT, BILIDIR, IBILI,  in the last 72 hours No results found for this basename: INR,  in the last 72 hours BNP (last 3  results) No results found for this basename: PROBNP,  in the last 8760 hours  Lipid Panel     Component Value Date/Time   CHOL 202* 08/27/2009 1027   TRIG 298.0* 08/27/2009 1027   HDL 33.90* 08/27/2009 1027   CHOLHDL 6 08/27/2009 1027   VLDL 59.6* 08/27/2009 1027      Imaging:  No results found.    Assessment/Plan:   Active Problems:   Unstable angina   CAD (coronary artery disease)   Aortic stenosis   For AVR/CABG on Friday. Discussed differences between bovine vs mechanical valve. She is awaiting further discussion with Dr. Servando Snare.  Tolerated multiple extraction of tooth numbers 2, 3, 4, 14, 18, 19; 3 quadrants of alveoloplasty and gross debridement of remaining dentition yesterday. Diet being advanced as tolerated. No chest pain, dyspnea or CHF.   Troy Sine, MD, [facility] 06/26/2014, 9:24 AM

## 2014-06-27 ENCOUNTER — Other Ambulatory Visit (HOSPITAL_COMMUNITY): Payer: Medicare Other

## 2014-06-27 ENCOUNTER — Inpatient Hospital Stay (HOSPITAL_COMMUNITY): Payer: Medicare Other

## 2014-06-27 ENCOUNTER — Encounter (HOSPITAL_COMMUNITY): Payer: Self-pay | Admitting: Dentistry

## 2014-06-27 LAB — CBC
HCT: 36.2 % (ref 36.0–46.0)
Hemoglobin: 11.4 g/dL — ABNORMAL LOW (ref 12.0–15.0)
MCH: 29.9 pg (ref 26.0–34.0)
MCHC: 31.5 g/dL (ref 30.0–36.0)
MCV: 95 fL (ref 78.0–100.0)
PLATELETS: 126 10*3/uL — AB (ref 150–400)
RBC: 3.81 MIL/uL — ABNORMAL LOW (ref 3.87–5.11)
RDW: 14.3 % (ref 11.5–15.5)
WBC: 11.5 10*3/uL — ABNORMAL HIGH (ref 4.0–10.5)

## 2014-06-27 LAB — URINALYSIS, ROUTINE W REFLEX MICROSCOPIC
BILIRUBIN URINE: NEGATIVE
GLUCOSE, UA: NEGATIVE mg/dL
Hgb urine dipstick: NEGATIVE
KETONES UR: NEGATIVE mg/dL
Leukocytes, UA: NEGATIVE
Nitrite: NEGATIVE
PH: 5.5 (ref 5.0–8.0)
Protein, ur: NEGATIVE mg/dL
Specific Gravity, Urine: 1.011 (ref 1.005–1.030)
Urobilinogen, UA: 0.2 mg/dL (ref 0.0–1.0)

## 2014-06-27 LAB — BASIC METABOLIC PANEL
Anion gap: 13 (ref 5–15)
BUN: 17 mg/dL (ref 6–23)
CO2: 23 mEq/L (ref 19–32)
Calcium: 9.2 mg/dL (ref 8.4–10.5)
Chloride: 101 mEq/L (ref 96–112)
Creatinine, Ser: 1.2 mg/dL — ABNORMAL HIGH (ref 0.50–1.10)
GFR calc Af Amer: 54 mL/min — ABNORMAL LOW (ref >90)
GFR calc non Af Amer: 46 mL/min — ABNORMAL LOW (ref >90)
Glucose, Bld: 177 mg/dL — ABNORMAL HIGH (ref 70–99)
Potassium: 5 mEq/L (ref 3.7–5.3)
Sodium: 137 mEq/L (ref 137–147)

## 2014-06-27 LAB — PROTIME-INR
INR: 1.02 (ref 0.00–1.49)
Prothrombin Time: 13.4 seconds (ref 11.6–15.2)

## 2014-06-27 LAB — HEMOGLOBIN A1C
Hgb A1c MFr Bld: 5.7 % — ABNORMAL HIGH (ref <5.7)
MEAN PLASMA GLUCOSE: 117 mg/dL — AB (ref <117)

## 2014-06-27 LAB — ABO/RH: ABO/RH(D): A POS

## 2014-06-27 LAB — SURGICAL PCR SCREEN
MRSA, PCR: NEGATIVE
Staphylococcus aureus: NEGATIVE

## 2014-06-27 LAB — HEPARIN LEVEL (UNFRACTIONATED): HEPARIN UNFRACTIONATED: 0.45 [IU]/mL (ref 0.30–0.70)

## 2014-06-27 LAB — APTT: aPTT: 197 seconds — ABNORMAL HIGH (ref 24–37)

## 2014-06-27 MED ORDER — DOPAMINE-DEXTROSE 3.2-5 MG/ML-% IV SOLN
2.0000 ug/kg/min | INTRAVENOUS | Status: DC
  Filled 2014-06-27: qty 250

## 2014-06-27 MED ORDER — SODIUM CHLORIDE 0.9 % IV SOLN
INTRAVENOUS | Status: AC
  Administered 2014-06-28: 69.8 mL/h via INTRAVENOUS
  Filled 2014-06-27: qty 40

## 2014-06-27 MED ORDER — CHLORHEXIDINE GLUCONATE CLOTH 2 % EX PADS: 6.0000 | MEDICATED_PAD | Freq: Once | CUTANEOUS | Status: DC

## 2014-06-27 MED ORDER — SODIUM CHLORIDE 0.9 % IV SOLN
INTRAVENOUS | Status: DC
  Filled 2014-06-27: qty 30

## 2014-06-27 MED ORDER — VANCOMYCIN HCL 10 G IV SOLR
1250.0000 mg | INTRAVENOUS | Status: AC
  Administered 2014-06-28: 1250 mg via INTRAVENOUS
  Filled 2014-06-27: qty 1250

## 2014-06-27 MED ORDER — BISACODYL 5 MG PO TBEC
5.0000 mg | DELAYED_RELEASE_TABLET | Freq: Once | ORAL | Status: AC
  Administered 2014-06-27: 5 mg via ORAL
  Filled 2014-06-27: qty 1

## 2014-06-27 MED ORDER — CHLORHEXIDINE GLUCONATE 0.12 % MT SOLN
15.0000 mL | Freq: Four times a day (QID) | OROMUCOSAL | Status: DC
  Administered 2014-06-28 – 2014-07-01 (×11): 15 mL via OROMUCOSAL
  Filled 2014-06-27 (×20): qty 15

## 2014-06-27 MED ORDER — DEXTROSE 5 % IV SOLN
750.0000 mg | INTRAVENOUS | Status: DC
  Filled 2014-06-27: qty 750

## 2014-06-27 MED ORDER — POTASSIUM CHLORIDE 2 MEQ/ML IV SOLN
80.0000 meq | INTRAVENOUS | Status: DC
  Filled 2014-06-27: qty 40

## 2014-06-27 MED ORDER — NITROGLYCERIN IN D5W 200-5 MCG/ML-% IV SOLN
2.0000 ug/min | INTRAVENOUS | Status: AC
  Administered 2014-06-28: 5 ug/min via INTRAVENOUS
  Filled 2014-06-27: qty 250

## 2014-06-27 MED ORDER — PLASMA-LYTE 148 IV SOLN
INTRAVENOUS | Status: AC
  Administered 2014-06-28: 10:00:00
  Filled 2014-06-27: qty 2.5

## 2014-06-27 MED ORDER — CHLORHEXIDINE GLUCONATE 4 % EX LIQD
CUTANEOUS | Status: AC
  Administered 2014-06-27: 21:00:00
  Filled 2014-06-27: qty 15

## 2014-06-27 MED ORDER — PHENYLEPHRINE HCL 10 MG/ML IJ SOLN
30.0000 ug/min | INTRAVENOUS | Status: AC
  Administered 2014-06-28: 5 ug/min via INTRAVENOUS
  Filled 2014-06-27: qty 2

## 2014-06-27 MED ORDER — METOPROLOL TARTRATE 12.5 MG HALF TABLET
12.5000 mg | ORAL_TABLET | Freq: Once | ORAL | Status: AC
  Administered 2014-06-28: 12.5 mg via ORAL
  Filled 2014-06-27: qty 1

## 2014-06-27 MED ORDER — ALPRAZOLAM 0.25 MG PO TABS
0.2500 mg | ORAL_TABLET | ORAL | Status: DC | PRN
  Administered 2014-06-27 (×2): 0.25 mg via ORAL
  Filled 2014-06-27 (×2): qty 1

## 2014-06-27 MED ORDER — DEXTROSE 5 % IV SOLN
1.5000 g | INTRAVENOUS | Status: AC
  Administered 2014-06-28: .75 g via INTRAVENOUS
  Administered 2014-06-28: 1.5 g via INTRAVENOUS
  Filled 2014-06-27: qty 1.5

## 2014-06-27 MED ORDER — MAGNESIUM SULFATE 50 % IJ SOLN
40.0000 meq | INTRAMUSCULAR | Status: DC
  Filled 2014-06-27: qty 10

## 2014-06-27 MED ORDER — DEXMEDETOMIDINE HCL IN NACL 400 MCG/100ML IV SOLN
0.1000 ug/kg/h | INTRAVENOUS | Status: AC
  Administered 2014-06-28: .4 ug/kg/h via INTRAVENOUS
  Filled 2014-06-27: qty 100

## 2014-06-27 MED ORDER — EPINEPHRINE HCL 1 MG/ML IJ SOLN
0.5000 ug/min | INTRAVENOUS | Status: DC
  Filled 2014-06-27: qty 4

## 2014-06-27 MED ORDER — SODIUM CHLORIDE 0.9 % IV SOLN
INTRAVENOUS | Status: AC
  Administered 2014-06-28: 1.6 [IU]/h via INTRAVENOUS
  Administered 2014-06-28: 1 [IU]/h via INTRAVENOUS
  Filled 2014-06-27: qty 1

## 2014-06-27 MED ORDER — TEMAZEPAM 15 MG PO CAPS: 15.0000 mg | ORAL_CAPSULE | Freq: Once | ORAL | Status: AC | PRN

## 2014-06-27 NOTE — Progress Notes (Signed)
Pt c/o feeling like "gauze stuck" where tooth was removed on L side. No gauze present on assessment, swelling and dried blood present. Rosaria Ferries, PA notified to assess. Dr. Radford Pax with dentistry also notified. No new orders received but advised pt may rinse mouth with warm salt water as needed. Will continue to monitor pt. Lenna Sciara

## 2014-06-27 NOTE — Progress Notes (Signed)
Patient ID: Carrie [name], female   DOB: 10-Dec-1950, 63 y.o.   MRN: [id number]      [address]       [place] [address]             [phone]                 2 Days Post-Op Procedure(s) (LRB): Extraction of tooth #'s 2,3,4,14, 18, 19 with alveoloplasty and gross debridement of remaining teeth ([name])  LOS: 7 days   Subjective: Eating solid breakfast, no fever, mouth improving    Objective: Vital signs in last 24 hours: Patient Vitals for the past 24 hrs:  BP Temp Temp src Pulse Resp SpO2 Weight  06/27/14 0604 167/54 mmHg 97.6 F (36.4 C) Oral 62 16 94 % 176 lb 3.2 oz (79.924 kg)  06/26/14 2001 155/56 mmHg 98.3 F (36.8 C) Oral 60 17 97 % -  06/26/14 1356 112/49 mmHg 97.7 F (36.5 C) Oral 48 16 95 % -  06/26/14 1031 133/51 mmHg - - 63 - - -    Filed Weights   06/25/14 0519 06/26/14 0400 06/27/14 0604  Weight: 174 lb (78.926 kg) 176 lb 14.4 oz (80.241 kg) 176 lb 3.2 oz (79.924 kg)    Hemodynamic parameters for last 24 hours:    Intake/Output from previous day: 08/05 0701 - 08/06 0700 In: 360 [P.O.:360] Out: -  Intake/Output this shift:    Scheduled Meds: . aspirin EC  81 mg Oral Daily  . atorvastatin  80 mg Oral q1800  . bisacodyl  5 mg Oral Once  . feeding supplement (RESOURCE BREEZE)  1 Container Oral TID BM  . metoprolol  50 mg Oral BID   Continuous Infusions: . sodium chloride    . heparin 1,350 Units/hr (06/26/14 1045)   PRN Meds:.acetaminophen, alum & mag hydroxide-simeth, docusate sodium, HYDROcodone-acetaminophen, magnesium hydroxide, nitroGLYCERIN, ondansetron (ZOFRAN) IV, oxyCODONE-acetaminophen  General appearance: alert, cooperative and appears older than stated age Neurologic: intact Heart: regular rate and rhythm, S1, S2 normal, no murmur, click, rub or gallop Lungs: clear to auscultation bilaterally Abdomen: soft, non-tender; bowel sounds normal; no masses,  no organomegaly Extremities: extremities normal, atraumatic, no  cyanosis or edema and Homans sign is negative, no sign of DVT  Lab Results: CBC: Recent Labs  06/26/14 0352 06/27/14 0559  WBC 11.8* 11.5*  HGB 12.1 11.4*  HCT 38.6 36.2  PLT 123* 126*   BMET: No results found for this basename: NA, K, CL, CO2, GLUCOSE, BUN, CREATININE, CALCIUM,  in the last 72 hours  PT/INR: No results found for this basename: LABPROT, INR,  in the last 72 hours   Radiology Dg Chest 2 View  06/27/2014   CLINICAL DATA:  Preoperative respiratory exam for coronary artery bypass surgery.  EXAM: CHEST - 2 VIEW  COMPARISON:  05/24/2014  FINDINGS: The heart size and mediastinal contours are within normal limits. Mild, diffuse pulmonary interstitial prominence likely reflects component of underlying chronic lung disease. The lungs are not hyperinflated. There is no evidence of pulmonary edema, consolidation, pneumothorax, nodule or pleural fluid. The visualized skeletal structures are unremarkable.  IMPRESSION: Evidence of chronic lung disease.  No acute findings.   Electronically Signed   By: Aletta Edouard M.D.   On: 06/27/2014 07:41     Assessment/Plan: S/P Procedure(s) (LRB): Extraction of tooth #'s 2,3,4,14, 18, 19 with alveoloplasty and gross debridement of remaining teeth ([name]) mild elevation of WBC, no fever or other indication of infection, will  be covered with iv antibiotics preop i have further discussed avr and cabg with patient and husband, patient  prefers  tissue valve The goals risks and alternatives of the planned surgical procedure CABG and AVR   have been discussed with the patient in detail. The risks of the procedure including death, infection, stroke, myocardial infarction, bleeding, blood transfusion have all been discussed specifically.  I have quoted Jonelle Sidle a 4% of perioperative mortality and a complication rate as high as 25 %. The patient's questions have been answered.RANIA PROTHERO is willing  to proceed with the planned procedure.  Grace Isaac MD 06/27/2014 8:24 AM

## 2014-06-27 NOTE — Progress Notes (Signed)
[facility] for Heparin Indication: CAD awaiting AVR/CABG  Allergies  Allergen Reactions  . Morphine Nausea And Vomiting    ONLY IN IV FORM    Patient Measurements: Height: 5' 5.5" (166.4 cm) Weight: 176 lb 3.2 oz (79.924 kg) IBW/kg (Calculated) : 58.15  Vital Signs: Temp: 97.6 F (36.4 C) (08/06 0604) Temp src: Oral (08/06 0604) BP: 167/54 mmHg (08/06 0604) Pulse Rate: 62 (08/06 0604)  Labs:  Recent Labs  06/25/14 0615 06/26/14 0352  06/26/14 1705 06/26/14 2232 06/27/14 0559  HGB 12.5 12.1  --   --   --  11.4*  HCT 37.9 38.6  --   --   --  36.2  PLT 154 123*  --   --   --  126*  LABPROT  --   --   --   --   --  13.4  INR  --   --   --   --   --  1.02  HEPARINUNFRC  --   --   < > 0.34 0.41 0.45  < > = values in this interval not displayed.  Estimated Creatinine Clearance: 45.2 ml/min (by C-G formula based on Cr of 1.31).   Medications:  Heparin 1200 units/hr   Assessment: 63 yo female with severe multivessel CAD continues on heparin while awaiting AVR/CABG. H/H wnl, Plts low but stable.  Pt is s/p dental procedure on 8/4, and was having mild gum bleeding which is appropriate. No other bleeding problems.   Goal of Therapy:  Heparin level 0.3-0.7 units/ml Monitor platelets by anticoagulation protocol: Yes    Plan:  1. Continue heparin gtt at 1350 units/hr. 2. Daily heparin level and CBC. 3. F/u plans for CABG/AVR   Hughes Better, PharmD, BCPS Clinical Pharmacist Pager: [phone] 06/27/2014 9:00 AM

## 2014-06-27 NOTE — Progress Notes (Signed)
Utilization review completed.  

## 2014-06-27 NOTE — Progress Notes (Signed)
   Subjective:   no chest pain or sob  Objective:   Vital Signs in the last 24 hours: Temp:  [97.6 F (36.4 C)-98.3 F (36.8 C)] 97.6 F (36.4 C) (08/06 0604) Pulse Rate:  [48-63] 62 (08/06 0604) Resp:  [16-17] 16 (08/06 0604) BP: (112-167)/(49-56) 167/54 mmHg (08/06 0604) SpO2:  [94 %-97 %] 94 % (08/06 0604) Weight:  [176 lb 3.2 oz (79.924 kg)] 176 lb 3.2 oz (79.924 kg) (08/06 0604)  Intake/Output from previous day: 08/05 0701 - 08/06 0700 In: 360 [P.O.:360] Out: -   Medications: . aspirin EC  81 mg Oral Daily  . atorvastatin  80 mg Oral q1800  . bisacodyl  5 mg Oral Once  . feeding supplement (RESOURCE BREEZE)  1 Container Oral TID BM  . metoprolol  50 mg Oral BID    . sodium chloride    . heparin 1,350 Units/hr (06/26/14 1045)    Physical Exam:   General appearance: alert, cooperative and no distress Neck: no adenopathy, no JVD, supple, symmetrical, trachea midline and thyroid not enlarged, symmetric, no tenderness/mass/nodules Lungs: clear to auscultation bilaterally Heart: regular rate and rhythm and 2-3/6 AS murmut Abdomen: soft, non-tender; bowel sounds normal; no masses,  no organomegaly Extremities: no edema, redness or tenderness in the calves or thighs Skin: Skin color, texture, turgor normal. No rashes or lesions Neurologic: Grossly normal   Rate: 63  Rhythm: NSR     Lab Results:   Recent Labs  06/27/14 0843  NA 137  K 5.0  CL 101  CO2 23  GLUCOSE 177*  BUN 17  CREATININE 1.20*    No results found for this basename: TROPONINI, CK, MB,  in the last 72 hours  Hepatic Function Panel No results found for this basename: PROT, ALBUMIN, AST, ALT, ALKPHOS, BILITOT, BILIDIR, IBILI,  in the last 72 hours  Recent Labs  06/27/14 0559  INR 1.02   BNP (last 3 results) No results found for this basename: PROBNP,  in the last 8760 hours  Lipid Panel     Component Value Date/Time   CHOL 202* 08/27/2009 1027   TRIG 298.0* 08/27/2009 1027   HDL  33.90* 08/27/2009 1027   CHOLHDL 6 08/27/2009 1027   VLDL 59.6* 08/27/2009 1027      Imaging:  Dg Chest 2 View  06/27/2014   CLINICAL DATA:  Preoperative respiratory exam for coronary artery bypass surgery.  EXAM: CHEST - 2 VIEW  COMPARISON:  05/24/2014  FINDINGS: The heart size and mediastinal contours are within normal limits. Mild, diffuse pulmonary interstitial prominence likely reflects component of underlying chronic lung disease. The lungs are not hyperinflated. There is no evidence of pulmonary edema, consolidation, pneumothorax, nodule or pleural fluid. The visualized skeletal structures are unremarkable.  IMPRESSION: Evidence of chronic lung disease.  No acute findings.   Electronically Signed   By: Aletta Edouard M.D.   On: 06/27/2014 07:41      Assessment/Plan:   Active Problems:   Unstable angina   CAD (coronary artery disease)   Aortic stenosis   For AVR/CABG on Friday. Plan is for tissue valve after further discussion with Dr. Servando Snare. Chronic lung disease on CXR.  No cp or SOB. Rhythm stable.  Troy Sine, MD, [facility] 06/27/2014, 10:21 AM

## 2014-06-28 ENCOUNTER — Inpatient Hospital Stay (HOSPITAL_COMMUNITY): Payer: Medicare Other | Admitting: Certified Registered Nurse Anesthetist

## 2014-06-28 ENCOUNTER — Encounter (HOSPITAL_COMMUNITY)
Admission: RE | Disposition: A | Discharge status: Home Health | Payer: Medicare Other | Source: Ambulatory Visit | Attending: Internal Medicine

## 2014-06-28 ENCOUNTER — Inpatient Hospital Stay (HOSPITAL_COMMUNITY): Payer: Medicare Other

## 2014-06-28 ENCOUNTER — Encounter (HOSPITAL_COMMUNITY): Payer: Medicare Other | Admitting: Certified Registered Nurse Anesthetist

## 2014-06-28 DIAGNOSIS — I359 Nonrheumatic aortic valve disorder, unspecified: Secondary | ICD-10-CM

## 2014-06-28 DIAGNOSIS — Z952 Presence of prosthetic heart valve: Secondary | ICD-10-CM

## 2014-06-28 DIAGNOSIS — I2510 Atherosclerotic heart disease of native coronary artery without angina pectoris: Secondary | ICD-10-CM

## 2014-06-28 HISTORY — PX: CORONARY ARTERY BYPASS GRAFT: SHX141

## 2014-06-28 HISTORY — PX: AORTIC VALVE REPLACEMENT: SHX41

## 2014-06-28 HISTORY — PX: INTRAOPERATIVE TRANSESOPHAGEAL ECHOCARDIOGRAM: SHX5062

## 2014-06-28 LAB — POCT I-STAT, CHEM 8
BUN: 13 mg/dL (ref 6–23)
BUN: 10 mg/dL (ref 6–23)
BUN: 11 mg/dL (ref 6–23)
BUN: 12 mg/dL (ref 6–23)
BUN: 12 mg/dL (ref 6–23)
BUN: 12 mg/dL (ref 6–23)
BUN: 12 mg/dL (ref 6–23)
CALCIUM ION: 1.14 mmol/L (ref 1.13–1.30)
CALCIUM ION: 0.96 mmol/L — AB (ref 1.13–1.30)
CHLORIDE: 105 meq/L (ref 96–112)
CHLORIDE: 100 meq/L (ref 96–112)
CHLORIDE: 99 meq/L (ref 96–112)
CHLORIDE: 99 meq/L (ref 96–112)
CHLORIDE: 97 meq/L (ref 96–112)
CREATININE: 0.8 mg/dL (ref 0.50–1.10)
CREATININE: 0.9 mg/dL (ref 0.50–1.10)
CREATININE: 1 mg/dL (ref 0.50–1.10)
Calcium, Ion: 0.98 mmol/L — ABNORMAL LOW (ref 1.13–1.30)
Calcium, Ion: 1.15 mmol/L (ref 1.13–1.30)
Calcium, Ion: 0.99 mmol/L — ABNORMAL LOW (ref 1.13–1.30)
Calcium, Ion: 1.04 mmol/L — ABNORMAL LOW (ref 1.13–1.30)
Calcium, Ion: 1.04 mmol/L — ABNORMAL LOW (ref 1.13–1.30)
Chloride: 102 mEq/L (ref 96–112)
Chloride: 98 mEq/L (ref 96–112)
Creatinine, Ser: 1 mg/dL (ref 0.50–1.10)
Creatinine, Ser: 1.1 mg/dL (ref 0.50–1.10)
Creatinine, Ser: 1 mg/dL (ref 0.50–1.10)
Creatinine, Ser: 1.1 mg/dL (ref 0.50–1.10)
GLUCOSE: 112 mg/dL — AB (ref 70–99)
GLUCOSE: 110 mg/dL — AB (ref 70–99)
GLUCOSE: 120 mg/dL — AB (ref 70–99)
GLUCOSE: 179 mg/dL — AB (ref 70–99)
Glucose, Bld: 126 mg/dL — ABNORMAL HIGH (ref 70–99)
Glucose, Bld: 180 mg/dL — ABNORMAL HIGH (ref 70–99)
Glucose, Bld: 157 mg/dL — ABNORMAL HIGH (ref 70–99)
HCT: 32 % — ABNORMAL LOW (ref 36.0–46.0)
HCT: 20 % — ABNORMAL LOW (ref 36.0–46.0)
HCT: 21 % — ABNORMAL LOW (ref 36.0–46.0)
HCT: 28 % — ABNORMAL LOW (ref 36.0–46.0)
HCT: 26 % — ABNORMAL LOW (ref 36.0–46.0)
HCT: 26 % — ABNORMAL LOW (ref 36.0–46.0)
HCT: 22 % — ABNORMAL LOW (ref 36.0–46.0)
HEMOGLOBIN: 7.1 g/dL — AB (ref 12.0–15.0)
HEMOGLOBIN: 7.5 g/dL — AB (ref 12.0–15.0)
Hemoglobin: 10.9 g/dL — ABNORMAL LOW (ref 12.0–15.0)
Hemoglobin: 6.8 g/dL — CL (ref 12.0–15.0)
Hemoglobin: 9.5 g/dL — ABNORMAL LOW (ref 12.0–15.0)
Hemoglobin: 8.8 g/dL — ABNORMAL LOW (ref 12.0–15.0)
Hemoglobin: 8.8 g/dL — ABNORMAL LOW (ref 12.0–15.0)
POTASSIUM: 4.4 meq/L (ref 3.7–5.3)
POTASSIUM: 4.6 meq/L (ref 3.7–5.3)
POTASSIUM: 5 meq/L (ref 3.7–5.3)
POTASSIUM: 4.3 meq/L (ref 3.7–5.3)
Potassium: 3.8 mEq/L (ref 3.7–5.3)
Potassium: 3.6 mEq/L — ABNORMAL LOW (ref 3.7–5.3)
Potassium: 4 mEq/L (ref 3.7–5.3)
SODIUM: 136 meq/L — AB (ref 137–147)
SODIUM: 133 meq/L — AB (ref 137–147)
Sodium: 133 mEq/L — ABNORMAL LOW (ref 137–147)
Sodium: 134 mEq/L — ABNORMAL LOW (ref 137–147)
Sodium: 135 mEq/L — ABNORMAL LOW (ref 137–147)
Sodium: 134 mEq/L — ABNORMAL LOW (ref 137–147)
Sodium: 133 mEq/L — ABNORMAL LOW (ref 137–147)
TCO2: 26 mmol/L (ref 0–100)
TCO2: 24 mmol/L (ref 0–100)
TCO2: 26 mmol/L (ref 0–100)
TCO2: 27 mmol/L (ref 0–100)
TCO2: 23 mmol/L (ref 0–100)
TCO2: 24 mmol/L (ref 0–100)
TCO2: 20 mmol/L (ref 0–100)

## 2014-06-28 LAB — POCT I-STAT 4, (NA,K, GLUC, HGB,HCT)
GLUCOSE: 97 mg/dL (ref 70–99)
HEMATOCRIT: 34 % — AB (ref 36.0–46.0)
HEMOGLOBIN: 11.6 g/dL — AB (ref 12.0–15.0)
Potassium: 4.1 mEq/L (ref 3.7–5.3)
Sodium: 134 mEq/L — ABNORMAL LOW (ref 137–147)

## 2014-06-28 LAB — PLATELET COUNT: Platelets: 95 10*3/uL — ABNORMAL LOW (ref 150–400)

## 2014-06-28 LAB — CREATININE, SERUM
Creatinine, Ser: 1.01 mg/dL (ref 0.50–1.10)
GFR calc Af Amer: 66 mL/min — ABNORMAL LOW (ref >90)
GFR calc non Af Amer: 57 mL/min — ABNORMAL LOW (ref >90)

## 2014-06-28 LAB — BASIC METABOLIC PANEL
Anion gap: 12 (ref 5–15)
BUN: 15 mg/dL (ref 6–23)
CO2: 25 mEq/L (ref 19–32)
Calcium: 9.2 mg/dL (ref 8.4–10.5)
Chloride: 100 mEq/L (ref 96–112)
Creatinine, Ser: 1.18 mg/dL — ABNORMAL HIGH (ref 0.50–1.10)
GFR calc Af Amer: 55 mL/min — ABNORMAL LOW (ref >90)
GFR calc non Af Amer: 47 mL/min — ABNORMAL LOW (ref >90)
Glucose, Bld: 123 mg/dL — ABNORMAL HIGH (ref 70–99)
Potassium: 5 mEq/L (ref 3.7–5.3)
Sodium: 137 mEq/L (ref 137–147)

## 2014-06-28 LAB — GLUCOSE, CAPILLARY
GLUCOSE-CAPILLARY: 91 mg/dL (ref 70–99)
GLUCOSE-CAPILLARY: 96 mg/dL (ref 70–99)
GLUCOSE-CAPILLARY: 112 mg/dL — AB (ref 70–99)
Glucose-Capillary: 107 mg/dL — ABNORMAL HIGH (ref 70–99)
Glucose-Capillary: 103 mg/dL — ABNORMAL HIGH (ref 70–99)
Glucose-Capillary: 101 mg/dL — ABNORMAL HIGH (ref 70–99)
Glucose-Capillary: 131 mg/dL — ABNORMAL HIGH (ref 70–99)
Glucose-Capillary: 108 mg/dL — ABNORMAL HIGH (ref 70–99)

## 2014-06-28 LAB — POCT I-STAT 3, ART BLOOD GAS (G3+)
ACID-BASE EXCESS: 3 mmol/L — AB (ref 0.0–2.0)
Acid-Base Excess: 2 mmol/L (ref 0.0–2.0)
Acid-Base Excess: 1 mmol/L (ref 0.0–2.0)
Acid-base deficit: 3 mmol/L — ABNORMAL HIGH (ref 0.0–2.0)
Acid-base deficit: 3 mmol/L — ABNORMAL HIGH (ref 0.0–2.0)
Acid-base deficit: 5 mmol/L — ABNORMAL HIGH (ref 0.0–2.0)
BICARBONATE: 27.1 meq/L — AB (ref 20.0–24.0)
BICARBONATE: 23.2 meq/L (ref 20.0–24.0)
Bicarbonate: 25 mEq/L — ABNORMAL HIGH (ref 20.0–24.0)
Bicarbonate: 27.2 mEq/L — ABNORMAL HIGH (ref 20.0–24.0)
Bicarbonate: 22.8 mEq/L (ref 20.0–24.0)
Bicarbonate: 20.6 mEq/L (ref 20.0–24.0)
O2 SAT: 100 %
O2 SAT: 99 %
O2 SAT: 94 %
O2 Saturation: 100 %
O2 Saturation: 72 %
O2 Saturation: 97 %
PCO2 ART: 37.1 mmHg (ref 35.0–45.0)
PCO2 ART: 38.1 mmHg (ref 35.0–45.0)
PCO2 ART: 42.1 mmHg (ref 35.0–45.0)
PCO2 ART: 44.1 mmHg (ref 35.0–45.0)
PCO2 ART: 39.9 mmHg (ref 35.0–45.0)
PH ART: 7.425 (ref 7.350–7.450)
PH ART: 7.33 — AB (ref 7.350–7.450)
PH ART: 7.325 — AB (ref 7.350–7.450)
PO2 ART: 411 mmHg — AB (ref 80.0–100.0)
PO2 ART: 176 mmHg — AB (ref 80.0–100.0)
PO2 ART: 103 mmHg — AB (ref 80.0–100.0)
PO2 ART: 82 mmHg (ref 80.0–100.0)
Patient temperature: 37.9
TCO2: 28 mmol/L (ref 0–100)
TCO2: 26 mmol/L (ref 0–100)
TCO2: 28 mmol/L (ref 0–100)
TCO2: 24 mmol/L (ref 0–100)
TCO2: 25 mmol/L (ref 0–100)
TCO2: 22 mmol/L (ref 0–100)
pCO2 arterial: 43.5 mmHg (ref 35.0–45.0)
pH, Arterial: 7.403 (ref 7.350–7.450)
pH, Arterial: 7.473 — ABNORMAL HIGH (ref 7.350–7.450)
pH, Arterial: 7.342 — ABNORMAL LOW (ref 7.350–7.450)
pO2, Arterial: 37 mmHg — CL (ref 80.0–100.0)
pO2, Arterial: 490 mmHg — ABNORMAL HIGH (ref 80.0–100.0)

## 2014-06-28 LAB — CBC
HCT: 29.3 % — ABNORMAL LOW (ref 36.0–46.0)
HCT: 26.3 % — ABNORMAL LOW (ref 36.0–46.0)
HEMATOCRIT: 35.5 % — AB (ref 36.0–46.0)
Hemoglobin: 11.3 g/dL — ABNORMAL LOW (ref 12.0–15.0)
Hemoglobin: 9.8 g/dL — ABNORMAL LOW (ref 12.0–15.0)
Hemoglobin: 8.9 g/dL — ABNORMAL LOW (ref 12.0–15.0)
MCH: 29.9 pg (ref 26.0–34.0)
MCH: 30.2 pg (ref 26.0–34.0)
MCH: 31 pg (ref 26.0–34.0)
MCHC: 31.8 g/dL (ref 30.0–36.0)
MCHC: 33.4 g/dL (ref 30.0–36.0)
MCHC: 33.8 g/dL (ref 30.0–36.0)
MCV: 93.9 fL (ref 78.0–100.0)
MCV: 90.2 fL (ref 78.0–100.0)
MCV: 91.6 fL (ref 78.0–100.0)
PLATELETS: 128 10*3/uL — AB (ref 150–400)
Platelets: 57 10*3/uL — ABNORMAL LOW (ref 150–400)
Platelets: 48 10*3/uL — ABNORMAL LOW (ref 150–400)
RBC: 3.78 MIL/uL — ABNORMAL LOW (ref 3.87–5.11)
RBC: 3.25 MIL/uL — AB (ref 3.87–5.11)
RBC: 2.87 MIL/uL — ABNORMAL LOW (ref 3.87–5.11)
RDW: 14.1 % (ref 11.5–15.5)
RDW: 14.7 % (ref 11.5–15.5)
RDW: 15.2 % (ref 11.5–15.5)
WBC: 11.9 10*3/uL — ABNORMAL HIGH (ref 4.0–10.5)
WBC: 9.8 10*3/uL (ref 4.0–10.5)
WBC: 6.8 10*3/uL (ref 4.0–10.5)

## 2014-06-28 LAB — BLOOD GAS, ARTERIAL
ACID-BASE EXCESS: 1.5 mmol/L (ref 0.0–2.0)
BICARBONATE: 25.5 meq/L — AB (ref 20.0–24.0)
Drawn by: 36989
FIO2: 21 %
O2 Saturation: 95.6 %
PATIENT TEMPERATURE: 98.6
TCO2: 26.7 mmol/L (ref 0–100)
pCO2 arterial: 39.5 mmHg (ref 35.0–45.0)
pH, Arterial: 7.425 (ref 7.350–7.450)
pO2, Arterial: 76.1 mmHg — ABNORMAL LOW (ref 80.0–100.0)

## 2014-06-28 LAB — HEMOGLOBIN AND HEMATOCRIT, BLOOD
HCT: 24.9 % — ABNORMAL LOW (ref 36.0–46.0)
Hemoglobin: 8.5 g/dL — ABNORMAL LOW (ref 12.0–15.0)

## 2014-06-28 LAB — HEPARIN LEVEL (UNFRACTIONATED): Heparin Unfractionated: 0.23 IU/mL — ABNORMAL LOW (ref 0.30–0.70)

## 2014-06-28 LAB — MAGNESIUM: Magnesium: 2.9 mg/dL — ABNORMAL HIGH (ref 1.5–2.5)

## 2014-06-28 LAB — PROTIME-INR
INR: 1.4 (ref 0.00–1.49)
Prothrombin Time: 17.2 seconds — ABNORMAL HIGH (ref 11.6–15.2)

## 2014-06-28 LAB — PREPARE RBC (CROSSMATCH)

## 2014-06-28 LAB — APTT: aPTT: 45 seconds — ABNORMAL HIGH (ref 24–37)

## 2014-06-28 SURGERY — CORONARY ARTERY BYPASS GRAFTING (CABG)
Anesthesia: General | Site: Chest

## 2014-06-28 MED ORDER — HEPARIN SODIUM (PORCINE) 1000 UNIT/ML IJ SOLN
INTRAMUSCULAR | Status: AC
  Filled 2014-06-28: qty 1

## 2014-06-28 MED ORDER — VECURONIUM BROMIDE 10 MG IV SOLR
INTRAVENOUS | Status: AC
  Filled 2014-06-28: qty 10

## 2014-06-28 MED ORDER — LIDOCAINE HCL (CARDIAC) 20 MG/ML IV SOLN
INTRAVENOUS | Status: AC
  Filled 2014-06-28: qty 5

## 2014-06-28 MED ORDER — ONDANSETRON HCL 4 MG/2ML IJ SOLN
4.0000 mg | Freq: Four times a day (QID) | INTRAMUSCULAR | Status: DC | PRN
  Administered 2014-06-30 (×2): 4 mg via INTRAVENOUS
  Filled 2014-06-28 (×2): qty 2

## 2014-06-28 MED ORDER — ATORVASTATIN CALCIUM 80 MG PO TABS
80.0000 mg | ORAL_TABLET | Freq: Every day | ORAL | Status: DC
  Administered 2014-06-29 – 2014-07-05 (×7): 80 mg via ORAL
  Filled 2014-06-28 (×8): qty 1

## 2014-06-28 MED ORDER — ACETAMINOPHEN 160 MG/5ML PO SOLN: 650.0000 mg | Freq: Once | ORAL | Status: AC

## 2014-06-28 MED ORDER — VANCOMYCIN HCL IN DEXTROSE 1-5 GM/200ML-% IV SOLN
1000.0000 mg | Freq: Once | INTRAVENOUS | Status: AC
  Administered 2014-06-28: 1000 mg via INTRAVENOUS
  Filled 2014-06-28: qty 200

## 2014-06-28 MED ORDER — METOPROLOL TARTRATE 1 MG/ML IV SOLN: 2.5000 mg | INTRAVENOUS | Status: DC | PRN

## 2014-06-28 MED ORDER — LACTATED RINGERS IV SOLN
INTRAVENOUS | Status: DC
  Administered 2014-06-28: 15:00:00 via INTRAVENOUS

## 2014-06-28 MED ORDER — LACTATED RINGERS IV SOLN: 500.0000 mL | Freq: Once | INTRAVENOUS | Status: AC | PRN

## 2014-06-28 MED ORDER — ACETAMINOPHEN 500 MG PO TABS
1000.0000 mg | ORAL_TABLET | Freq: Four times a day (QID) | ORAL | Status: DC
  Administered 2014-06-29 – 2014-07-01 (×9): 1000 mg via ORAL
  Filled 2014-06-28 (×14): qty 2

## 2014-06-28 MED ORDER — ACETAMINOPHEN 160 MG/5ML PO SOLN
1000.0000 mg | Freq: Four times a day (QID) | ORAL | Status: DC
  Filled 2014-06-28: qty 40

## 2014-06-28 MED ORDER — ROCURONIUM BROMIDE 100 MG/10ML IV SOLN
INTRAVENOUS | Status: DC | PRN
  Administered 2014-06-28: 50 mg via INTRAVENOUS

## 2014-06-28 MED ORDER — CHLORHEXIDINE GLUCONATE 0.12 % MT SOLN: 15.0000 mL | Freq: Two times a day (BID) | OROMUCOSAL | Status: DC

## 2014-06-28 MED ORDER — HEPARIN SODIUM (PORCINE) 1000 UNIT/ML IJ SOLN
INTRAMUSCULAR | Status: DC | PRN
  Administered 2014-06-28: 25000 [IU] via INTRAVENOUS

## 2014-06-28 MED ORDER — SODIUM CHLORIDE 0.9 % IV SOLN: 250.0000 mL | INTRAVENOUS | Status: DC

## 2014-06-28 MED ORDER — PROTAMINE SULFATE 10 MG/ML IV SOLN
INTRAVENOUS | Status: AC
  Filled 2014-06-28: qty 25

## 2014-06-28 MED ORDER — DOCUSATE SODIUM 100 MG PO CAPS
200.0000 mg | ORAL_CAPSULE | Freq: Every day | ORAL | Status: DC
  Administered 2014-06-29 – 2014-07-01 (×3): 200 mg via ORAL
  Filled 2014-06-28 (×3): qty 2

## 2014-06-28 MED ORDER — ALBUMIN HUMAN 5 % IV SOLN
250.0000 mL | INTRAVENOUS | Status: AC | PRN
  Administered 2014-06-28 (×4): 250 mL via INTRAVENOUS
  Filled 2014-06-28 (×2): qty 250

## 2014-06-28 MED ORDER — 0.9 % SODIUM CHLORIDE (POUR BTL) OPTIME
TOPICAL | Status: DC | PRN
  Administered 2014-06-28: 6000 mL

## 2014-06-28 MED ORDER — OXYCODONE HCL 5 MG PO TABS
5.0000 mg | ORAL_TABLET | ORAL | Status: DC | PRN
Start: 2014-06-28 — End: 2014-07-01
  Administered 2014-06-29 (×3): 10 mg via ORAL
  Administered 2014-06-29: 5 mg via ORAL
  Administered 2014-06-29 – 2014-07-01 (×7): 10 mg via ORAL
  Filled 2014-06-28 (×8): qty 2
  Filled 2014-06-28: qty 1
  Filled 2014-06-28 (×2): qty 2

## 2014-06-28 MED ORDER — MAGNESIUM SULFATE 40 MG/ML IJ SOLN
INTRAMUSCULAR | Status: AC
  Filled 2014-06-28: qty 100

## 2014-06-28 MED ORDER — ASPIRIN 81 MG PO CHEW: 324.0000 mg | CHEWABLE_TABLET | Freq: Every day | ORAL | Status: DC

## 2014-06-28 MED ORDER — METOPROLOL TARTRATE 25 MG/10 ML ORAL SUSPENSION
12.5000 mg | Freq: Two times a day (BID) | ORAL | Status: DC
  Filled 2014-06-28 (×7): qty 5

## 2014-06-28 MED ORDER — FENTANYL CITRATE 0.05 MG/ML IJ SOLN
INTRAMUSCULAR | Status: AC
  Filled 2014-06-28: qty 5

## 2014-06-28 MED ORDER — PROPOFOL 10 MG/ML IV BOLUS
INTRAVENOUS | Status: DC | PRN
  Administered 2014-06-28: 100 mg via INTRAVENOUS

## 2014-06-28 MED ORDER — MORPHINE SULFATE 2 MG/ML IJ SOLN
1.0000 mg | INTRAMUSCULAR | Status: DC | PRN
Start: 2014-06-28 — End: 2014-06-28

## 2014-06-28 MED ORDER — SODIUM CHLORIDE 0.9 % IJ SOLN
OROMUCOSAL | Status: DC | PRN
  Administered 2014-06-28: 10:00:00 via TOPICAL

## 2014-06-28 MED ORDER — PHENYLEPHRINE HCL 10 MG/ML IJ SOLN
0.0000 ug/min | INTRAVENOUS | Status: DC
  Filled 2014-06-28: qty 2

## 2014-06-28 MED ORDER — PANTOPRAZOLE SODIUM 40 MG PO TBEC
40.0000 mg | DELAYED_RELEASE_TABLET | Freq: Every day | ORAL | Status: DC
  Administered 2014-06-30 – 2014-07-01 (×2): 40 mg via ORAL
  Filled 2014-06-28 (×3): qty 1

## 2014-06-28 MED ORDER — ALBUMIN HUMAN 5 % IV SOLN
INTRAVENOUS | Status: DC | PRN
  Administered 2014-06-28 (×2): via INTRAVENOUS

## 2014-06-28 MED ORDER — NITROGLYCERIN IN D5W 200-5 MCG/ML-% IV SOLN: 0.0000 ug/min | INTRAVENOUS | Status: DC

## 2014-06-28 MED ORDER — SODIUM CHLORIDE 0.9 % IV SOLN
INTRAVENOUS | Status: DC
  Filled 2014-06-28: qty 1

## 2014-06-28 MED ORDER — MIDAZOLAM HCL 10 MG/2ML IJ SOLN
INTRAMUSCULAR | Status: AC
  Filled 2014-06-28: qty 2

## 2014-06-28 MED ORDER — ARTIFICIAL TEARS OP OINT
TOPICAL_OINTMENT | OPHTHALMIC | Status: DC | PRN
  Administered 2014-06-28: 1 via OPHTHALMIC

## 2014-06-28 MED ORDER — MAGNESIUM SULFATE 4000MG/100ML IJ SOLN
4.0000 g | Freq: Once | INTRAMUSCULAR | Status: AC
  Administered 2014-06-28: 4 g via INTRAVENOUS
  Filled 2014-06-28: qty 100

## 2014-06-28 MED ORDER — FENTANYL CITRATE 0.05 MG/ML IJ SOLN
50.0000 ug | INTRAMUSCULAR | Status: DC | PRN
  Administered 2014-06-28 – 2014-06-29 (×6): 100 ug via INTRAVENOUS
  Administered 2014-06-29: 50 ug via INTRAVENOUS
  Administered 2014-06-29 – 2014-06-30 (×7): 100 ug via INTRAVENOUS
  Filled 2014-06-28 (×14): qty 2

## 2014-06-28 MED ORDER — PROTAMINE SULFATE 10 MG/ML IV SOLN
INTRAVENOUS | Status: DC | PRN
  Administered 2014-06-28: 200 mg via INTRAVENOUS

## 2014-06-28 MED ORDER — BISACODYL 5 MG PO TBEC
10.0000 mg | DELAYED_RELEASE_TABLET | Freq: Every day | ORAL | Status: DC
  Administered 2014-06-29 – 2014-07-01 (×3): 10 mg via ORAL
  Filled 2014-06-28 (×3): qty 2

## 2014-06-28 MED ORDER — HEMOSTATIC AGENTS (NO CHARGE) OPTIME
TOPICAL | Status: DC | PRN
  Administered 2014-06-28: 1 via TOPICAL

## 2014-06-28 MED ORDER — ROCURONIUM BROMIDE 50 MG/5ML IV SOLN
INTRAVENOUS | Status: AC
  Filled 2014-06-28: qty 1

## 2014-06-28 MED ORDER — MIDAZOLAM HCL 2 MG/2ML IJ SOLN: 2.0000 mg | INTRAMUSCULAR | Status: DC | PRN

## 2014-06-28 MED ORDER — DEXTROSE 5 % IV SOLN
1.5000 g | Freq: Two times a day (BID) | INTRAVENOUS | Status: DC
  Filled 2014-06-28: qty 1.5

## 2014-06-28 MED ORDER — FENTANYL CITRATE 0.05 MG/ML IJ SOLN
INTRAMUSCULAR | Status: DC | PRN
  Administered 2014-06-28: 25 ug via INTRAVENOUS
  Administered 2014-06-28 (×2): 250 ug via INTRAVENOUS
  Administered 2014-06-28: 175 ug via INTRAVENOUS
  Administered 2014-06-28 (×4): 250 ug via INTRAVENOUS
  Administered 2014-06-28: 50 ug via INTRAVENOUS

## 2014-06-28 MED ORDER — MORPHINE SULFATE 2 MG/ML IJ SOLN
2.0000 mg | INTRAMUSCULAR | Status: DC | PRN
  Filled 2014-06-28: qty 1

## 2014-06-28 MED ORDER — CETYLPYRIDINIUM CHLORIDE 0.05 % MT LIQD
7.0000 mL | Freq: Two times a day (BID) | OROMUCOSAL | Status: DC
  Administered 2014-06-28 – 2014-07-06 (×13): 7 mL via OROMUCOSAL

## 2014-06-28 MED ORDER — PHENYLEPHRINE HCL 10 MG/ML IJ SOLN
INTRAMUSCULAR | Status: AC
  Filled 2014-06-28: qty 2

## 2014-06-28 MED ORDER — METOPROLOL TARTRATE 12.5 MG HALF TABLET
12.5000 mg | ORAL_TABLET | Freq: Two times a day (BID) | ORAL | Status: DC
  Administered 2014-06-29 – 2014-06-30 (×3): 12.5 mg via ORAL
  Filled 2014-06-28 (×7): qty 1

## 2014-06-28 MED ORDER — SODIUM CHLORIDE 0.9 % IJ SOLN
3.0000 mL | Freq: Two times a day (BID) | INTRAMUSCULAR | Status: DC
  Administered 2014-06-29 – 2014-07-01 (×5): 3 mL via INTRAVENOUS

## 2014-06-28 MED ORDER — ARTIFICIAL TEARS OP OINT
TOPICAL_OINTMENT | OPHTHALMIC | Status: AC
  Filled 2014-06-28: qty 3.5

## 2014-06-28 MED ORDER — MIDAZOLAM HCL 5 MG/5ML IJ SOLN
INTRAMUSCULAR | Status: DC | PRN
  Administered 2014-06-28 (×2): 1 mg via INTRAVENOUS
  Administered 2014-06-28: 3 mg via INTRAVENOUS
  Administered 2014-06-28: 4 mg via INTRAVENOUS
  Administered 2014-06-28: 1 mg via INTRAVENOUS

## 2014-06-28 MED ORDER — POTASSIUM CHLORIDE 10 MEQ/50ML IV SOLN: 10.0000 meq | INTRAVENOUS | Status: AC

## 2014-06-28 MED ORDER — SODIUM CHLORIDE 0.9 % IJ SOLN: 3.0000 mL | INTRAMUSCULAR | Status: DC | PRN

## 2014-06-28 MED ORDER — LACTATED RINGERS IV SOLN
INTRAVENOUS | Status: DC | PRN
  Administered 2014-06-28 (×5): via INTRAVENOUS

## 2014-06-28 MED ORDER — ACETAMINOPHEN 650 MG RE SUPP
650.0000 mg | Freq: Once | RECTAL | Status: AC
  Administered 2014-06-28: 650 mg via RECTAL

## 2014-06-28 MED ORDER — NYSTATIN 100000 UNIT/GM EX CREA
TOPICAL_CREAM | Freq: Two times a day (BID) | CUTANEOUS | Status: DC
  Administered 2014-06-28 – 2014-06-30 (×4): via TOPICAL
  Administered 2014-06-30: 1 via TOPICAL
  Administered 2014-07-01 – 2014-07-06 (×11): via TOPICAL
  Filled 2014-06-28 (×2): qty 15

## 2014-06-28 MED ORDER — DEXMEDETOMIDINE HCL IN NACL 200 MCG/50ML IV SOLN
0.1000 ug/kg/h | INTRAVENOUS | Status: DC
Start: 2014-06-28 — End: 2014-07-01
  Administered 2014-06-28: 0.5 ug/kg/h via INTRAVENOUS
  Filled 2014-06-28: qty 50

## 2014-06-28 MED ORDER — SODIUM CHLORIDE 0.9 % IV SOLN: INTRAVENOUS | Status: DC

## 2014-06-28 MED ORDER — SODIUM CHLORIDE 0.45 % IV SOLN
INTRAVENOUS | Status: DC
  Administered 2014-06-28: 15:00:00 via INTRAVENOUS
  Administered 2014-06-29: 20 mL/h via INTRAVENOUS

## 2014-06-28 MED ORDER — PROPOFOL 10 MG/ML IV BOLUS
INTRAVENOUS | Status: AC
  Filled 2014-06-28: qty 20

## 2014-06-28 MED ORDER — FAMOTIDINE IN NACL 20-0.9 MG/50ML-% IV SOLN
20.0000 mg | Freq: Two times a day (BID) | INTRAVENOUS | Status: AC
  Administered 2014-06-28 (×2): 20 mg via INTRAVENOUS
  Filled 2014-06-28: qty 50

## 2014-06-28 MED ORDER — BISACODYL 10 MG RE SUPP: 10.0000 mg | Freq: Every day | RECTAL | Status: DC

## 2014-06-28 MED ORDER — ASPIRIN EC 325 MG PO TBEC
325.0000 mg | DELAYED_RELEASE_TABLET | Freq: Every day | ORAL | Status: DC
  Administered 2014-06-29 – 2014-07-01 (×3): 325 mg via ORAL
  Filled 2014-06-28 (×3): qty 1

## 2014-06-28 MED ORDER — INSULIN REGULAR BOLUS VIA INFUSION
0.0000 [IU] | Freq: Three times a day (TID) | INTRAVENOUS | Status: DC
  Filled 2014-06-28: qty 10

## 2014-06-28 MED ORDER — DEXTROSE 5 % IV SOLN
1.5000 g | Freq: Two times a day (BID) | INTRAVENOUS | Status: AC
  Administered 2014-06-28 – 2014-06-30 (×4): 1.5 g via INTRAVENOUS
  Filled 2014-06-28 (×4): qty 1.5

## 2014-06-28 MED ORDER — VECURONIUM BROMIDE 10 MG IV SOLR
INTRAVENOUS | Status: DC | PRN
  Administered 2014-06-28 (×6): 5 mg via INTRAVENOUS

## 2014-06-28 SURGICAL SUPPLY — 92 items
ADAPTER CARDIO PERF ANTE/RETRO (ADAPTER) ×3 IMPLANT
ADPR PRFSN 84XANTGRD RTRGD (ADAPTER) ×2
APPLICATOR COTTON TIP 6IN STRL (MISCELLANEOUS) IMPLANT
ATTRACTOMAT 16X20 MAGNETIC DRP (DRAPES) ×3 IMPLANT
BAG DECANTER FOR FLEXI CONT (MISCELLANEOUS) ×3 IMPLANT
BANDAGE ELASTIC 4 VELCRO ST LF (GAUZE/BANDAGES/DRESSINGS) ×3 IMPLANT
BANDAGE ELASTIC 6 VELCRO ST LF (GAUZE/BANDAGES/DRESSINGS) ×3 IMPLANT
BLADE STERNUM SYSTEM 6 (BLADE) ×3 IMPLANT
BLADE SURG 15 STRL LF DISP TIS (BLADE) ×2 IMPLANT
BLADE SURG 15 STRL SS (BLADE) ×3
BNDG GAUZE ELAST 4 BULKY (GAUZE/BANDAGES/DRESSINGS) ×3 IMPLANT
BOOT SUTURE AID YELLOW STND (SUTURE) ×3 IMPLANT
CANISTER SUCTION 2500CC (MISCELLANEOUS) ×3 IMPLANT
CANNULA AORTIC HI-FLOW 6.5M20F (CANNULA) ×3 IMPLANT
CANNULA GUNDRY RCSP 15FR (MISCELLANEOUS) ×1 IMPLANT
CANNULA VEN 2 STAGE (MISCELLANEOUS) ×1 IMPLANT
CANNULA VENOUS DUAL 32/40 (CANNULA) IMPLANT
CARDIAC SUCTION (MISCELLANEOUS) ×3 IMPLANT
CATH CPB KIT GERHARDT (MISCELLANEOUS) ×3 IMPLANT
CATH HEART VENT LEFT (CATHETERS) ×2 IMPLANT
CATH RETROPLEGIA CORONARY 14FR (CATHETERS) ×3 IMPLANT
CATH THORACIC 28FR (CATHETERS) ×3 IMPLANT
CATH/SQUID NICHOLS JEHLE COR (CATHETERS) IMPLANT
COVER SURGICAL LIGHT HANDLE (MISCELLANEOUS) ×3 IMPLANT
CRADLE DONUT ADULT HEAD (MISCELLANEOUS) ×3 IMPLANT
DRAIN CHANNEL 28F RND 3/8 FF (WOUND CARE) ×3 IMPLANT
DRAIN CHANNEL 32F RND 10.7 FF (WOUND CARE) IMPLANT
DRAPE CARDIOVASCULAR INCISE (DRAPES) ×3
DRAPE SLUSH/WARMER DISC (DRAPES) ×3 IMPLANT
DRAPE SRG 135X102X78XABS (DRAPES) ×2 IMPLANT
DRSG AQUACEL AG ADV 3.5X14 (GAUZE/BANDAGES/DRESSINGS) ×3 IMPLANT
ELECT BLADE 4.0 EZ CLEAN MEGAD (MISCELLANEOUS) ×3
ELECT CAUTERY BLADE 6.4 (BLADE) ×3 IMPLANT
ELECT REM PT RETURN 9FT ADLT (ELECTROSURGICAL) ×6
ELECTRODE BLDE 4.0 EZ CLN MEGD (MISCELLANEOUS) ×2 IMPLANT
ELECTRODE REM PT RTRN 9FT ADLT (ELECTROSURGICAL) ×4 IMPLANT
GAUZE SPONGE 4X4 12PLY STRL (GAUZE/BANDAGES/DRESSINGS) ×6 IMPLANT
GLOVE BIO SURGEON STRL SZ 6.5 (GLOVE) ×9 IMPLANT
GOWN STRL REUS W/ TWL LRG LVL3 (GOWN DISPOSABLE) ×8 IMPLANT
GOWN STRL REUS W/TWL LRG LVL3 (GOWN DISPOSABLE) ×12
HEMOSTAT POWDER SURGIFOAM 1G (HEMOSTASIS) ×9 IMPLANT
HEMOSTAT SURGICEL 2X14 (HEMOSTASIS) ×3 IMPLANT
INSERT FOGARTY XLG (MISCELLANEOUS) IMPLANT
KIT BASIN OR (CUSTOM PROCEDURE TRAY) ×3 IMPLANT
KIT ROOM TURNOVER OR (KITS) ×3 IMPLANT
KIT SUCTION CATH 14FR (SUCTIONS) ×6 IMPLANT
KIT VASOVIEW W/TROCAR VH 2000 (KITS) ×3 IMPLANT
LEAD PACING MYOCARDI (MISCELLANEOUS) ×3 IMPLANT
LINE VENT (MISCELLANEOUS) ×2 IMPLANT
MARKER GRAFT CORONARY BYPASS (MISCELLANEOUS) ×9 IMPLANT
NDL 18GX1X1/2 (RX/OR ONLY) (NEEDLE) ×2 IMPLANT
NEEDLE 18GX1X1/2 (RX/OR ONLY) (NEEDLE) ×3 IMPLANT
NS IRRIG 1000ML POUR BTL (IV SOLUTION) ×15 IMPLANT
PACK OPEN HEART (CUSTOM PROCEDURE TRAY) ×3 IMPLANT
PAD ARMBOARD 7.5X6 YLW CONV (MISCELLANEOUS) ×6 IMPLANT
PAD ELECT DEFIB RADIOL ZOLL (MISCELLANEOUS) ×3 IMPLANT
PENCIL BUTTON HOLSTER BLD 10FT (ELECTRODE) ×3 IMPLANT
PUNCH AORTIC ROT 4.0MM RCL 40 (MISCELLANEOUS) ×1 IMPLANT
PUNCH AORTIC ROTATE  4.5MM 8IN (MISCELLANEOUS) ×1 IMPLANT
SET CARDIOPLEGIA MPS 5001102 (MISCELLANEOUS) ×1 IMPLANT
SPONGE GAUZE 4X4 12PLY STER LF (GAUZE/BANDAGES/DRESSINGS) ×3 IMPLANT
SUT BONE WAX W31G (SUTURE) ×3 IMPLANT
SUT ETHIBON 2 0 V 52N 30 (SUTURE) ×6 IMPLANT
SUT ETHIBOND NAB MH 2-0 36IN (SUTURE) ×1 IMPLANT
SUT PROLENE 3 0 RB 1 (SUTURE) ×1 IMPLANT
SUT PROLENE 3 0 SH 1 (SUTURE) ×3 IMPLANT
SUT PROLENE 3 0 SH1 36 (SUTURE) ×3 IMPLANT
SUT PROLENE 4 0 RB 1 (SUTURE) ×3
SUT PROLENE 4 0 TF (SUTURE) ×6 IMPLANT
SUT PROLENE 4-0 RB1 .5 CRCL 36 (SUTURE) ×2 IMPLANT
SUT PROLENE 6 0 CC (SUTURE) ×6 IMPLANT
SUT PROLENE 7 0 BV1 MDA (SUTURE) ×3 IMPLANT
SUT PROLENE 8 0 BV175 6 (SUTURE) ×2 IMPLANT
SUT SILK 2 0 SH CR/8 (SUTURE) ×1 IMPLANT
SUT STEEL 6MS V (SUTURE) ×3 IMPLANT
SUT STEEL SZ 6 DBL 3X14 BALL (SUTURE) ×3 IMPLANT
SUT VIC AB 1 CTX 18 (SUTURE) ×6 IMPLANT
SUT VIC AB 2-0 CT1 27 (SUTURE) ×12
SUT VIC AB 2-0 CT1 TAPERPNT 27 (SUTURE) IMPLANT
SUT VIC AB 3-0 X1 27 (SUTURE) ×3 IMPLANT
SUTURE E-PAK OPEN HEART (SUTURE) ×3 IMPLANT
SYSTEM SAHARA CHEST DRAIN ATS (WOUND CARE) ×3 IMPLANT
TAPE CLOTH SURG 4X10 WHT LF (GAUZE/BANDAGES/DRESSINGS) ×2 IMPLANT
TOWEL OR 17X24 6PK STRL BLUE (TOWEL DISPOSABLE) ×6 IMPLANT
TOWEL OR 17X26 10 PK STRL BLUE (TOWEL DISPOSABLE) ×6 IMPLANT
TRAY FOLEY IC TEMP SENS 16FR (CATHETERS) ×3 IMPLANT
TUBE FEEDING 8FR 16IN STR KANG (MISCELLANEOUS) ×3 IMPLANT
TUBING INSUFFLATION 10FT LAP (TUBING) ×3 IMPLANT
UNDERPAD 30X30 INCONTINENT (UNDERPADS AND DIAPERS) ×3 IMPLANT
VALVE MAGNA EASE AORTIC 19MM (Prosthesis & Implant Heart) ×1 IMPLANT
VENT LEFT HEART 12002 (CATHETERS) ×3
WATER STERILE IRR 1000ML POUR (IV SOLUTION) ×6 IMPLANT

## 2014-06-28 NOTE — Progress Notes (Signed)
Patient ID: Carrie [name], female   DOB: 07-Feb-1951, 63 y.o.   MRN: [id number] EVENING ROUNDS NOTE :     [address]       ,[place] [address]             [phone]                 Day of Surgery Procedure(s) (LRB): Coronary artery bypass grafting on pump using left internal mammary artery to left anterior descending artery, right greater saphenous vein to right coronary artery. ([name]) Aortic valve replacement using [facility] Ease 77mm valve Serial number [id number] ([name]) INTRAOPERATIVE TRANSESOPHAGEAL ECHOCARDIOGRAM ([name])  Total Length of Stay:  LOS: 8 days  BP 116/59  Pulse 80  Temp(Src) 96.4 F (35.8 C) (Core (Comment))  Resp 12  Ht 5' 5.5" (1.664 m)  Wt 173 lb 9.6 oz (78.744 kg)  BMI 28.44 kg/m2  SpO2 99%  .Intake/Output     08/06 0701 - 08/07 0700 08/07 0701 - 08/08 0700   P.O.     I.V. (mL/kg)  2306.6 (29.3)   Blood  550   IV Piggyback  1150   Total Intake(mL/kg)  4006.6 (50.9)   Urine (mL/kg/hr)  1625 (1.8)   Emesis/NG output  100 (0.1)   Blood  800 (0.9)   Chest Tube  120 (0.1)   Total Output   2645   Net   +1361.6          . sodium chloride 20 mL/hr at 06/28/14 1430  . sodium chloride 10 mL/hr at 06/28/14 1430  . [START ON 06/29/2014] sodium chloride    . dexmedetomidine 0.3 mcg/kg/hr (06/28/14 1800)  . insulin (NOVOLIN-R) infusion 1.4 Units/hr (06/28/14 1800)  . lactated ringers 20 mL/hr at 06/28/14 1500  . nitroGLYCERIN 35 mcg/min (06/28/14 1800)  . phenylephrine (NEO-SYNEPHRINE) Adult infusion Stopped (06/28/14 1410)     Lab Results  Component Value Date   WBC 9.8 06/28/2014   HGB 9.8* 06/28/2014   HCT 29.3* 06/28/2014   PLT 57* 06/28/2014   GLUCOSE 97 06/28/2014   CHOL 202* 08/27/2009   TRIG 298.0* 08/27/2009   HDL 33.90* 08/27/2009   LDLDIRECT 101.9 08/27/2009   ALT 16 05/24/2014   AST 28 05/24/2014   NA 134* 06/28/2014   K 4.1 06/28/2014   CL 97 06/28/2014   CREATININE 1.10 06/28/2014   BUN 12 06/28/2014   CO2 25 06/28/2014   TSH 3.66 08/27/2009   INR 1.40 06/28/2014   HGBA1C 5.7* 06/27/2014   Still sleepy, but moves all extremities Not bleeding  Grace Isaac MD  [facility] [phone] Office [phone] 06/28/2014 6:25 PM

## 2014-06-28 NOTE — Brief Op Note (Addendum)
      [address]       [place] [address]             [phone]     06/20/2014 - 06/28/2014  12:29 PM  PATIENT:  Jonelle Sidle  63 y.o. female  PRE-OPERATIVE DIAGNOSIS:  CAD, AS  POST-OPERATIVE DIAGNOSIS:  Coronary artery disease; aortic stenosis  PROCEDURE:  Procedure(s): CORONARY ARTERY BYPASS GRAFTING (CABG)X2 LIMA-LAD; SVG-RCA AORTIC VALVE REPLACEMENT (AVR)# 19 MAGNAEASE INTRAOPERATIVE TRANSESOPHAGEAL ECHOCARDIOGRAM  SURGEON:  Surgeon(s): Grace Isaac, MD  PHYSICIAN ASSISTANT: WAYNE GOLD PA-C  ANESTHESIA:   general  PATIENT CONDITION:  ICU - intubated and hemodynamically stable.  PRE-OPERATIVE WEIGHT: 78 KG Aortic Valve  Procedure Performed:  Replacement: Yes.  Bioprosthetic Valve. Implant Model Number:[id number], Size:19, Unique Device Identifier:[id number]  Repair/Reconstruction: No.   Aortic Annular Enlargement: No. Aortic Valve Etiology   Aortic Insufficiency:  Trivial/Trace  Aortic Valve Disease:  Yes.  Aortic Stenosis:  Yes. Smallest Aortic Valve Area: .58cm2; Highest Mean Gradient: 85mmHg.  Etiology (Choose at least one and up to  5 etiologies):  Degenerative - Calcified

## 2014-06-28 NOTE — Op Note (Signed)
[name], Carrie [name]                  ACCOUNT NO.:  [id number]  MEDICAL RECORD NO.:  [id number]  LOCATION:  2S09C                        FACILITY:  [facility]  PHYSICIAN:  Ala Dach, M.D.DATE OF BIRTH:  11-07-1951  DATE OF PROCEDURE:  06/28/2014 DATE OF DISCHARGE:                              OPERATIVE REPORT   INTRAOPERATIVE TRANSESOPHAGEAL ECHOCARDIOGRAPHIC REPORT  INDICATIONS FOR PROCEDURE:  Carrie [name] is a 63 year old female, patient of Dr. Thompson Grayer, who presents today for aortic valve replacement and coronary artery bypass grafting to be performed by Dr. Ceasar Mons.  TECHNIQUE:  She was brought to the holding area on the morning of surgery where under local anesthesia with sedation, pulmonary artery and radial arterial lines were placed.  She was then taken to the OR for routine induction of general anesthesia.  The TEE probe is prepared and then passed oropharyngeally into the stomach, then slightly withdrawn for imaging of the cardiac structures.  PRECARDIOPULMONARY BYPASS TEE EXAMINATION:  Left ventricle:  The left ventricular chamber is seen initially in the short axis view.  There is a pattern of concentric left ventricular hypertrophy.   Wall thickness is approximately 1.2 to 1.3 cm.  This is noted in the short axis view.  Both long and short axis views are obtained.  There is good to excellent overall contractile pattern appreciated in all segmental wall areas.  Mitral valve:  In the four-chamber view of the heart, we can immediately notice that there is a significant amount of annular calcium along the edges of the mitral valve apparatus.  The leaflets themselves are thickened.  There is fairly normal excursion during diastole.  Pulse Wave doppler across this somewhat thickened mitral valve reveals a blunted E:A wave, indicative of left ventricular diastolic dysfunction.  Of note, also is a small area in the posterior leaflet of some mild prolapse.  On color  Doppler, there is mild regurgitant flow appreciated centrally through the mitral valve.  Left Atrium:  The left atrial chamber is interrogated.  The left atrial size itself is within normal limits.  The appendage is visualized.  No masses are appreciated within.  The interatrial septum is interrogated and is intact.  Aortic Valve:  On further pullback of the probe, we can now see the short axis view of the aortic valve.  There are three cusps apparently in view, however, there is such heavy calcium.  There is significant and severe restriction of movement in this aortic valve in both short and long axis views.  There is heavy calcium along the top of all leaflets. There is just a slit like opening, likely associated with what will be significant aortic stenosis.  Color Doppler shows a 3+ stenotic turbulent jet through the central aspect of the valve into the ascending aorta.  Pulmonary peak gradient and mean gradient measurements are obtained with deep transgastric view which revealed a peak gradient of nearly 120 mmHg with mean gradients of 75 mmHg.  Calculated aortic valve area is approximately 0.7 cm2.  Right Ventricle:  Right ventricular chamber is normal.  Tricuspid Valve:  The tricuspid valve is thin, compliant, and mobile.  Right Atrium:  Normal right atrial  chamber and pulmonary artery catheter noted within.  Of further note, in the ascending aorta, there is heavy calcium deposited at the area of the sino-tubular junction as well as the areas above that in the aortic wall.  There are no mobile masses however appreciated in the ascending aorta.  The patient was placed on cardiopulmonary bypass.  The aortotomy was performed.  The diseased aortic valve was excised and placed with a #19 pericardial tissue valve.  Coronary artery bypass grafting is further carried out.  The patient is separated from cardiopulmonary bypass with initial attempt.  POST CARDIOPULMONARY BYPASS TEE  EXAMINATION:  Left Ventricle:  The left ventricular chamber is seen early in the bypass.  There is vigorous contractile pattern appreciated.  There is normal wall motion appreciated.  This is seen in both long and short axis views.  Aortic Valve:  Aortic valve is initially viewed in the short axis view. The edges and struts of the #19 pericardial tissue valve are well outlined.  In this plane of the short axis view, we see that the valve itself was well situated.  The three leaflet edges were seen and appreciated.  They are thin, mobile, and compliant.  They offer no obstruction to opening during systolic contraction.  There is no aortic insufficiency appreciated.  This appears to be a satisfactory placement of the aortic valve.  Mitral Valve:  Attention was then drawn to the mitral valve.  There is some mild MR that remains, but this is no different from the prebypass. Again, attention was drawn to the posterior leaflet which shows that small area of posterior leaflet prolapse.  This certainly is no more remarkable than it was in the prebypass.  The rest of the cardiac examination was as previously described, and the patient ultimately returned to the cardiac intensive care unit in stable condition.          ______________________________ Ala Dach, M.D.     JTM/MEDQ  D:  06/28/2014  T:  06/28/2014  Job:  [id number]

## 2014-06-28 NOTE — Anesthesia Postprocedure Evaluation (Signed)
  Anesthesia Post-op Note  Patient: Carrie [name]  Procedure(s) Performed: Procedure(s): Coronary artery bypass grafting on pump using left internal mammary artery to left anterior descending artery, right greater saphenous vein to right coronary artery. ([name]) Aortic valve replacement using [facility] Ease 20mm valve Serial number [id number] ([name]) INTRAOPERATIVE TRANSESOPHAGEAL ECHOCARDIOGRAM ([name])  Patient Location: SICU  Anesthesia Type:General  Level of Consciousness: sedated, unresponsive and Patient remains intubated per anesthesia plan  Airway and Oxygen Therapy: Patient remains intubated per anesthesia plan and Patient placed on Ventilator (see vital sign flow sheet for setting)  Post-op Pain: none  Post-op Assessment: Post-op Vital signs reviewed and Patient's Cardiovascular Status Stable  Post-op Vital Signs: stable  Last Vitals:  Filed Vitals:   06/28/14 0536  BP:   Pulse: 74  Temp:   Resp:     Complications: No apparent anesthesia complications

## 2014-06-28 NOTE — Anesthesia Preprocedure Evaluation (Signed)
Anesthesia Evaluation  Patient identified by MRN, date of birth, ID band Patient awake    Reviewed: Allergy & Precautions, H&P , NPO status   Airway Mallampati: II      Dental  (+) Poor Dentition   Pulmonary Current Smoker,  breath sounds clear to auscultation        Cardiovascular hypertension, + angina + dysrhythmias + Valvular Problems/Murmurs AS Rhythm:Regular Rate:Normal     Neuro/Psych  Neuromuscular disease    GI/Hepatic negative GI ROS, Neg liver ROS,   Endo/Other  negative endocrine ROS  Renal/GU negative Renal ROS     Musculoskeletal   Abdominal (+) + obese,   Peds  Hematology   Anesthesia Other Findings   Reproductive/Obstetrics                           Anesthesia Physical Anesthesia Plan  ASA: III  Anesthesia Plan: General   Post-op Pain Management:    Induction: Intravenous  Airway Management Planned: Oral ETT  Additional Equipment: Arterial line, PA Cath and TEE  Intra-op Plan:   Post-operative Plan: Post-operative intubation/ventilation  Informed Consent: I have reviewed the patients History and Physical, chart, labs and discussed the procedure including the risks, benefits and alternatives for the proposed anesthesia with the patient or authorized representative who has indicated his/her understanding and acceptance.   Dental advisory given  Plan Discussed with: CRNA and Surgeon  Anesthesia Plan Comments:         Anesthesia Quick Evaluation

## 2014-06-28 NOTE — Procedures (Signed)
Extubation Procedure Note  Patient Details:   Name: Carrie [name] DOB: 1951-07-14 MRN: [id number]   Airway Documentation: VC 700 ml, NIF -40.  Extubated to 4 lpm nasal cannula.    Evaluation  O2 sats: stable throughout Complications: No apparent complications Patient did tolerate procedure well. Bilateral Breath Sounds: Clear;Diminished   Yes  Edword Cu, Elwyn Lade 06/28/2014, 11:09 PM

## 2014-06-28 NOTE — Progress Notes (Signed)
Echocardiogram Echocardiogram Transesophageal has been performed.  Carrie [name] 06/28/2014, 9:04 AM

## 2014-06-28 NOTE — Transfer of Care (Signed)
Immediate Anesthesia Transfer of Care Note  Patient: Carrie [name]  Procedure(s) Performed: Procedure(s): Coronary artery bypass grafting on pump using left internal mammary artery to left anterior descending artery, right greater saphenous vein to right coronary artery. ([name]) Aortic valve replacement using [facility] Ease 62mm valve Serial number [id number] ([name]) INTRAOPERATIVE TRANSESOPHAGEAL ECHOCARDIOGRAM ([name])  Patient Location: ICU  Anesthesia Type:General  Level of Consciousness: sedated and Patient remains intubated per anesthesia plan  Airway & Oxygen Therapy: Patient remains intubated per anesthesia plan and Patient placed on Ventilator (see vital sign flow sheet for setting)  Post-op Assessment: Report given to PACU RN and Post -op Vital signs reviewed and stable  Post vital signs: Reviewed and stable  Complications: No apparent anesthesia complications

## 2014-06-28 NOTE — Anesthesia Procedure Notes (Signed)
Procedure Name: Intubation Date/Time: 06/28/2014 7:59 AM Performed by: Ned Grace Pre-anesthesia Checklist: Patient identified, Patient being monitored, Emergency Drugs available, Timeout performed and Suction available Patient Re-evaluated:Patient Re-evaluated prior to inductionOxygen Delivery Method: Circle system utilized Preoxygenation: Pre-oxygenation with 100% oxygen Intubation Type: IV induction Ventilation: Mask ventilation without difficulty Laryngoscope Size: Mac and 3 Tube type: Oral Tube size: 7.5 mm Number of attempts: 1 Airway Equipment and Method: Stylet Placement Confirmation: ETT inserted through vocal cords under direct vision,  breath sounds checked- equal and bilateral and positive ETCO2 Secured at: 23 cm Tube secured with: Tape Dental Injury: Teeth and Oropharynx as per pre-operative assessment and Bloody posterior oropharynx

## 2014-06-29 ENCOUNTER — Inpatient Hospital Stay (HOSPITAL_COMMUNITY): Payer: Medicare Other

## 2014-06-29 DIAGNOSIS — Z951 Presence of aortocoronary bypass graft: Secondary | ICD-10-CM

## 2014-06-29 LAB — POCT I-STAT, CHEM 8
BUN: 12 mg/dL (ref 6–23)
Calcium, Ion: 1.24 mmol/L (ref 1.13–1.30)
Chloride: 101 meq/L (ref 96–112)
Creatinine, Ser: 1.1 mg/dL (ref 0.50–1.10)
Glucose, Bld: 182 mg/dL — ABNORMAL HIGH (ref 70–99)
HCT: 29 % — ABNORMAL LOW (ref 36.0–46.0)
Hemoglobin: 9.9 g/dL — ABNORMAL LOW (ref 12.0–15.0)
Potassium: 4.4 meq/L (ref 3.7–5.3)
Sodium: 134 meq/L — ABNORMAL LOW (ref 137–147)
TCO2: 19 mmol/L (ref 0–100)

## 2014-06-29 LAB — CBC
HCT: 26.8 % — ABNORMAL LOW (ref 36.0–46.0)
HEMATOCRIT: 27.7 % — AB (ref 36.0–46.0)
HEMOGLOBIN: 9.1 g/dL — AB (ref 12.0–15.0)
HEMOGLOBIN: 9.2 g/dL — AB (ref 12.0–15.0)
MCH: 30.8 pg (ref 26.0–34.0)
MCH: 30.6 pg (ref 26.0–34.0)
MCHC: 34 g/dL (ref 30.0–36.0)
MCHC: 33.2 g/dL (ref 30.0–36.0)
MCV: 90.8 fL (ref 78.0–100.0)
MCV: 92 fL (ref 78.0–100.0)
Platelets: 59 10*3/uL — ABNORMAL LOW (ref 150–400)
Platelets: 80 10*3/uL — ABNORMAL LOW (ref 150–400)
RBC: 2.95 MIL/uL — AB (ref 3.87–5.11)
RBC: 3.01 MIL/uL — ABNORMAL LOW (ref 3.87–5.11)
RDW: 15.8 % — ABNORMAL HIGH (ref 11.5–15.5)
RDW: 16 % — ABNORMAL HIGH (ref 11.5–15.5)
WBC: 10 10*3/uL (ref 4.0–10.5)
WBC: 12.1 10*3/uL — ABNORMAL HIGH (ref 4.0–10.5)

## 2014-06-29 LAB — GLUCOSE, CAPILLARY
GLUCOSE-CAPILLARY: 103 mg/dL — AB (ref 70–99)
GLUCOSE-CAPILLARY: 106 mg/dL — AB (ref 70–99)
GLUCOSE-CAPILLARY: 142 mg/dL — AB (ref 70–99)
GLUCOSE-CAPILLARY: 156 mg/dL — AB (ref 70–99)
GLUCOSE-CAPILLARY: 95 mg/dL (ref 70–99)
Glucose-Capillary: 104 mg/dL — ABNORMAL HIGH (ref 70–99)
Glucose-Capillary: 116 mg/dL — ABNORMAL HIGH (ref 70–99)
Glucose-Capillary: 116 mg/dL — ABNORMAL HIGH (ref 70–99)
Glucose-Capillary: 128 mg/dL — ABNORMAL HIGH (ref 70–99)

## 2014-06-29 LAB — BASIC METABOLIC PANEL
Anion gap: 13 (ref 5–15)
BUN: 12 mg/dL (ref 6–23)
CO2: 20 meq/L (ref 19–32)
Calcium: 8.1 mg/dL — ABNORMAL LOW (ref 8.4–10.5)
Chloride: 104 mEq/L (ref 96–112)
Creatinine, Ser: 1.08 mg/dL (ref 0.50–1.10)
GFR calc Af Amer: 61 mL/min — ABNORMAL LOW (ref >90)
GFR calc non Af Amer: 53 mL/min — ABNORMAL LOW (ref >90)
GLUCOSE: 109 mg/dL — AB (ref 70–99)
POTASSIUM: 4.3 meq/L (ref 3.7–5.3)
SODIUM: 137 meq/L (ref 137–147)

## 2014-06-29 LAB — CREATININE, SERUM
CREATININE: 1.03 mg/dL (ref 0.50–1.10)
GFR calc Af Amer: 65 mL/min — ABNORMAL LOW (ref >90)
GFR, EST NON AFRICAN AMERICAN: 56 mL/min — AB (ref >90)

## 2014-06-29 LAB — POCT I-STAT 3, ART BLOOD GAS (G3+)
Acid-base deficit: 4 mmol/L — ABNORMAL HIGH (ref 0.0–2.0)
Bicarbonate: 21.2 mEq/L (ref 20.0–24.0)
O2 Saturation: 93 %
PCO2 ART: 40.3 mmHg (ref 35.0–45.0)
Patient temperature: 38.2
TCO2: 22 mmol/L (ref 0–100)
pH, Arterial: 7.335 — ABNORMAL LOW (ref 7.350–7.450)
pO2, Arterial: 74 mmHg — ABNORMAL LOW (ref 80.0–100.0)

## 2014-06-29 LAB — MAGNESIUM
MAGNESIUM: 2.4 mg/dL (ref 1.5–2.5)
Magnesium: 2.4 mg/dL (ref 1.5–2.5)

## 2014-06-29 MED ORDER — INSULIN ASPART 100 UNIT/ML ~~LOC~~ SOLN: 0.0000 [IU] | SUBCUTANEOUS | Status: DC

## 2014-06-29 MED ORDER — INSULIN ASPART 100 UNIT/ML ~~LOC~~ SOLN
0.0000 [IU] | SUBCUTANEOUS | Status: DC
  Administered 2014-06-29 – 2014-06-30 (×7): 2 [IU] via SUBCUTANEOUS

## 2014-06-29 NOTE — Progress Notes (Signed)
Patient ID: Carrie [name], female   DOB: 1951/03/19, 63 y.o.   MRN: [id number] TCTS DAILY ICU PROGRESS NOTE                   [name]            [place] [address]          [phone]   1 Day Post-Op Procedure(s) (LRB): Coronary artery bypass grafting on pump using left internal mammary artery to left anterior descending artery, right greater saphenous vein to right coronary artery. ([name]) Aortic valve replacement using [name] Ease 28mm valve Serial number [id number] ([name]) INTRAOPERATIVE TRANSESOPHAGEAL ECHOCARDIOGRAM ([name])  Total Length of Stay:  LOS: 9 days   Subjective: Awake, neuro intact, some soreness but good respiratory effort  Objective: Vital signs in last 24 hours: Temp:  [95.2 F (35.1 C)-101.3 F (38.5 C)] 99.3 F (37.4 C) (08/08 0800) Pulse Rate:  [80] 80 (08/08 0800) Cardiac Rhythm:  [-] Atrial paced (08/08 0744) Resp:  [0-29] 21 (08/08 0800) BP: (93-130)/(46-79) 130/56 mmHg (08/08 0800) SpO2:  [86 %-100 %] 96 % (08/08 0800) Arterial Line BP: (112-184)/(39-66) 180/56 mmHg (08/08 0800) FiO2 (%):  [40 %-100 %] 40 % (08/07 2220) Weight:  [190 lb 14.7 oz (86.6 kg)] 190 lb 14.7 oz (86.6 kg) (08/08 0246)  Filed Weights   06/27/14 0604 06/28/14 0536 06/29/14 0246  Weight: 176 lb 3.2 oz (79.924 kg) 173 lb 9.6 oz (78.744 kg) 190 lb 14.7 oz (86.6 kg)    Weight change: 17 lb 5.1 oz (7.855 kg)   Hemodynamic parameters for last 24 hours: PAP: (23-51)/(11-22) 45/22 mmHg CO:  [5.2 L/min-8.5 L/min] 6.5 L/min CI:  [2.8 L/min/m2-3.5 L/min/m2] 3.5 L/min/m2  Intake/Output from previous day: 08/07 0701 - 08/08 0700 In: 5269.8 [I.V.:3069.8; Blood:550; IV DGLOVFIEP:3295] Out: 1884 [ZYSAY:3016; Emesis/NG output:175; Blood:800; Chest Tube:390]  Intake/Output this shift: Total I/O In: 40 [I.V.:40] Out: 55 [Urine:35; Chest Tube:20]  Current Meds: Scheduled Meds: . acetaminophen  1,000 mg Oral 4 times per day   Or  . acetaminophen (TYLENOL) oral  liquid 160 mg/5 mL  1,000 mg Per Tube 4 times per day  . antiseptic oral rinse  7 mL Mouth Rinse BID  . aspirin EC  325 mg Oral Daily   Or  . aspirin  324 mg Per Tube Daily  . atorvastatin  80 mg Oral q1800  . bisacodyl  10 mg Oral Daily   Or  . bisacodyl  10 mg Rectal Daily  . cefUROXime (ZINACEF)  IV  1.5 g Intravenous Q12H  . chlorhexidine  15 mL Mouth/Throat QID  . docusate sodium  200 mg Oral Daily  . insulin aspart  0-24 Units Subcutaneous 6 times per day  . metoprolol tartrate  12.5 mg Oral BID   Or  . metoprolol tartrate  12.5 mg Per Tube BID  . nystatin cream   Topical BID  . [START ON 06/30/2014] pantoprazole  40 mg Oral Daily  . sodium chloride  3 mL Intravenous Q12H   Continuous Infusions: . sodium chloride 20 mL/hr at 06/28/14 1430  . sodium chloride 10 mL/hr at 06/28/14 1430  . sodium chloride    . dexmedetomidine Stopped (06/28/14 2000)  . lactated ringers 20 mL/hr at 06/28/14 1500  . nitroGLYCERIN Stopped (06/29/14 0700)  . phenylephrine (NEO-SYNEPHRINE) Adult infusion Stopped (06/28/14 1410)   PRN Meds:.fentaNYL, metoprolol, midazolam, ondansetron (ZOFRAN) IV, oxyCODONE, sodium chloride  General appearance: alert, cooperative and no distress Neurologic:  intact Heart: regular rate and rhythm, S1, S2 normal, no murmur, click, rub or gallop Lungs: diminished breath sounds bibasilar Abdomen: soft, non-tender; bowel sounds normal; no masses,  no organomegaly Extremities: extremities normal, atraumatic, no cyanosis or edema and Homans sign is negative, no sign of DVT Wound: sternum stable  Lab Results: CBC: Recent Labs  06/28/14 2005 06/29/14 0400  WBC 6.8 10.0  HGB 8.9* 9.1*  HCT 26.3* 26.8*  PLT 48* 59*   BMET:  Recent Labs  06/28/14 0305  06/28/14 1243 06/28/14 1429 06/28/14 2005 06/29/14 0400  NA 137  < > 133* 134*  --  137  K 5.0  < > 4.3 4.1  --  4.3  CL 100  < > 97  --   --  104  CO2 25  --   --   --   --  20  GLUCOSE 123*  < > 157* 97  --   109*  BUN 15  < > 12  --   --  12  CREATININE 1.18*  < > 1.10  --  1.01 1.08  CALCIUM 9.2  --   --   --   --  8.1*  < > = values in this interval not displayed.  PT/INR:  Recent Labs  06/28/14 1430  LABPROT 17.2*  INR 1.40   Radiology: Dg Chest Portable 1 View In Am  06/29/2014   CLINICAL DATA:  Status post median sternotomy.  EXAM: PORTABLE CHEST - 1 VIEW  COMPARISON:  One-view chest 06/28/2014.  FINDINGS: Patient has been extubated. A mediastinal drain and left-sided chest tubes remain in place. There is no pneumothorax. A Swan-Ganz catheter is stable in position. Overall aeration has improved. A small left pleural effusion is evident. Left basilar atelectasis is noted.  IMPRESSION: 1. Interval extubation. 2. Improved aeration. 3. Residual small left pleural effusion and associated atelectasis. 4. The support apparatus is otherwise stable. Left-sided chest tube is in place without pneumothorax.   Electronically Signed   By: Lawrence Santiago M.D.   On: 06/29/2014 09:40   Dg Chest Portable 1 View  06/28/2014   CLINICAL DATA:  Postop CABG and aortic valve replacement.  EXAM: PORTABLE CHEST - 1 VIEW  COMPARISON:  06/27/2014  FINDINGS: Sequelae of interval CABG and aortic valve replacement are identified. Endotracheal tube tip projects approximately 5 cm above the carina. Right jugular Swan-Ganz catheter terminates over the distal main pulmonary artery. Mediastinal tube and left chest tube are in place. Enteric tube loops in the left upper abdomen with tip not imaged.  Cardiac silhouette appears upper limits of normal in size. Thoracic aortic calcification is noted. There is mild pulmonary vascular congestion. Diffusely increased interstitial markings are more prominent than on the prior study. No definite pleural effusion is identified, although a trace left pleural effusion is questioned. No pneumothorax is identified.  IMPRESSION: 1. Interval postoperative changes with support devices as above. 2.  Pulmonary vascular congestion with likely mild interstitial edema.   Electronically Signed   By: [place] Bores   On: 06/28/2014 14:43     Assessment/Plan: S/P Procedure(s) (LRB): Coronary artery bypass grafting on pump using left internal mammary artery to left anterior descending artery, right greater saphenous vein to right coronary artery. ([name]) Aortic valve replacement using [facility] Ease 81mm valve Serial number [id number] ([name]) INTRAOPERATIVE TRANSESOPHAGEAL ECHOCARDIOGRAM ([name]) Mobilize Diuresis Diabetes control d/c tubes/lines Continue foley due to strict I&O and urinary output monitoring See progression orders Expected Acute  Blood -  loss Anemia Thrombocytopenia- avoid heparin for now       Livianna Petraglia B 06/29/2014 10:37 AM

## 2014-06-29 NOTE — Plan of Care (Signed)
Problem: Phase I - Pre-Op Goal: Pain controlled with appropriate interventions Outcome: Progressing Requiring fentanyl and oxycodone to decrease to pain goal of 5.  Problem: Phase II - Intermediate Post-Op Goal: Pain controlled with appropriate interventions Outcome: Progressing Requiring fentanyl and oxycodone to achieve pain goal of 5. Splinting with heart pillow and changing positions. Goal: Advance Diet Outcome: Progressing Tolerating clear liquids and soda crackers without extra salt. Request to stay on clear at present.

## 2014-06-29 NOTE — Progress Notes (Signed)
Patient ID: Carrie [name], female   DOB: 06-Aug-1951, 63 y.o.   MRN: [id number] EVENING ROUNDS NOTE :     [address]       [place] [address]             [phone]                 1 Day Post-Op Procedure(s) (LRB): Coronary artery bypass grafting on pump using left internal mammary artery to left anterior descending artery, right greater saphenous vein to right coronary artery. ([name]) Aortic valve replacement using [facility] Ease 20mm valve Serial number [id number] ([name]) INTRAOPERATIVE TRANSESOPHAGEAL ECHOCARDIOGRAM ([name])  Total Length of Stay:  LOS: 9 days  BP 136/52  Pulse 70  Temp(Src) 99.4 F (37.4 C) (Oral)  Resp 23  Ht 5' 5.5" (1.664 m)  Wt 190 lb 14.7 oz (86.6 kg)  BMI 31.28 kg/m2  SpO2 95%  .Intake/Output     08/08 0701 - 08/09 0700   P.O. 800   I.V. (mL/kg) 330 (3.8)   Blood    IV Piggyback 50   Total Intake(mL/kg) 1180 (13.6)   Urine (mL/kg/hr) 460 (0.4)   Emesis/NG output    Blood    Chest Tube 230 (0.2)   Total Output 690   Net +490         . sodium chloride 20 mL/hr (06/29/14 1725)  . sodium chloride 10 mL/hr at 06/28/14 1430  . sodium chloride    . dexmedetomidine Stopped (06/28/14 2000)  . lactated ringers Stopped (06/29/14 1200)  . nitroGLYCERIN Stopped (06/29/14 0700)  . phenylephrine (NEO-SYNEPHRINE) Adult infusion Stopped (06/28/14 1410)     Lab Results  Component Value Date   WBC 12.1* 06/29/2014   HGB 9.9* 06/29/2014   HCT 29.0* 06/29/2014   PLT 80* 06/29/2014   GLUCOSE 182* 06/29/2014   CHOL 202* 08/27/2009   TRIG 298.0* 08/27/2009   HDL 33.90* 08/27/2009   LDLDIRECT 101.9 08/27/2009   ALT 16 05/24/2014   AST 28 05/24/2014   NA 134* 06/29/2014   K 4.4 06/29/2014   CL 101 06/29/2014   CREATININE 1.10 06/29/2014   BUN 12 06/29/2014   CO2 20 06/29/2014   TSH 3.66 08/27/2009   INR 1.40 06/28/2014   HGBA1C 5.7* 06/27/2014   Stable day Sinus rhythm D/c chest tube in am  Grace Isaac MD  Beeper [phone] Office [phone] 06/29/2014 9:14  PM

## 2014-06-30 ENCOUNTER — Inpatient Hospital Stay (HOSPITAL_COMMUNITY): Payer: Medicare Other

## 2014-06-30 LAB — BASIC METABOLIC PANEL
Anion gap: 12 (ref 5–15)
BUN: 15 mg/dL (ref 6–23)
CO2: 22 mEq/L (ref 19–32)
Calcium: 8.2 mg/dL — ABNORMAL LOW (ref 8.4–10.5)
Chloride: 97 mEq/L (ref 96–112)
Creatinine, Ser: 1.06 mg/dL (ref 0.50–1.10)
GFR calc Af Amer: 62 mL/min — ABNORMAL LOW (ref >90)
GFR calc non Af Amer: 54 mL/min — ABNORMAL LOW (ref >90)
Glucose, Bld: 120 mg/dL — ABNORMAL HIGH (ref 70–99)
Potassium: 4.8 mEq/L (ref 3.7–5.3)
Sodium: 131 mEq/L — ABNORMAL LOW (ref 137–147)

## 2014-06-30 LAB — GLUCOSE, CAPILLARY
GLUCOSE-CAPILLARY: 118 mg/dL — AB (ref 70–99)
GLUCOSE-CAPILLARY: 118 mg/dL — AB (ref 70–99)
Glucose-Capillary: 122 mg/dL — ABNORMAL HIGH (ref 70–99)
Glucose-Capillary: 130 mg/dL — ABNORMAL HIGH (ref 70–99)
Glucose-Capillary: 143 mg/dL — ABNORMAL HIGH (ref 70–99)
Glucose-Capillary: 151 mg/dL — ABNORMAL HIGH (ref 70–99)

## 2014-06-30 LAB — CBC
HCT: 29.4 % — ABNORMAL LOW (ref 36.0–46.0)
Hemoglobin: 9.6 g/dL — ABNORMAL LOW (ref 12.0–15.0)
MCH: 31 pg (ref 26.0–34.0)
MCHC: 32.7 g/dL (ref 30.0–36.0)
MCV: 94.8 fL (ref 78.0–100.0)
Platelets: 86 10*3/uL — ABNORMAL LOW (ref 150–400)
RBC: 3.1 MIL/uL — ABNORMAL LOW (ref 3.87–5.11)
RDW: 15.8 % — ABNORMAL HIGH (ref 11.5–15.5)
WBC: 12.9 10*3/uL — ABNORMAL HIGH (ref 4.0–10.5)

## 2014-06-30 MED ORDER — FUROSEMIDE 10 MG/ML IJ SOLN
40.0000 mg | Freq: Once | INTRAMUSCULAR | Status: AC
  Administered 2014-06-30: 40 mg via INTRAVENOUS
  Filled 2014-06-30: qty 4

## 2014-06-30 MED ORDER — SODIUM CHLORIDE 0.9 % IJ SOLN
10.0000 mL | INTRAMUSCULAR | Status: DC | PRN
  Administered 2014-06-30: 10 mL via INTRAVENOUS

## 2014-06-30 NOTE — Progress Notes (Signed)
Patient ID: Carrie [name], female   DOB: 09-26-1951, 63 y.o.   MRN: [id number] EVENING ROUNDS NOTE :     [address]       [place] [address]             [phone]                 2 Days Post-Op Procedure(s) (LRB): Coronary artery bypass grafting on pump using left internal mammary artery to left anterior descending artery, right greater saphenous vein to right coronary artery. ([name]) Aortic valve replacement using [facility] Ease 65mm valve Serial number [id number] ([name]) INTRAOPERATIVE TRANSESOPHAGEAL ECHOCARDIOGRAM ([name])  Total Length of Stay:  LOS: 10 days  BP 117/46  Pulse 67  Temp(Src) 98.2 F (36.8 C) (Oral)  Resp 21  Ht 5' 5.5" (1.664 m)  Wt 192 lb 12.8 oz (87.454 kg)  BMI 31.58 kg/m2  SpO2 95%  .Intake/Output     08/08 0701 - 08/09 0700 08/09 0701 - 08/10 0700   P.O. 800 600   I.V. (mL/kg) 530 (6.1) 190 (2.2)   Blood     IV Piggyback 100 50   Total Intake(mL/kg) 1430 (16.4) 840 (9.6)   Urine (mL/kg/hr) 865 (0.4) 905 (0.9)   Emesis/NG output     Blood     Chest Tube 410 (0.2) 70 (0.1)   Total Output 1275 975   Net +155 -135          . sodium chloride 20 mL/hr (06/29/14 1725)  . sodium chloride 10 mL/hr at 06/28/14 1430  . sodium chloride    . dexmedetomidine Stopped (06/28/14 2000)  . lactated ringers Stopped (06/29/14 1200)  . nitroGLYCERIN Stopped (06/29/14 0700)  . phenylephrine (NEO-SYNEPHRINE) Adult infusion Stopped (06/28/14 1410)     Lab Results  Component Value Date   WBC 12.9* 06/30/2014   HGB 9.6* 06/30/2014   HCT 29.4* 06/30/2014   PLT 86* 06/30/2014   GLUCOSE 120* 06/30/2014   CHOL 202* 08/27/2009   TRIG 298.0* 08/27/2009   HDL 33.90* 08/27/2009   LDLDIRECT 101.9 08/27/2009   ALT 16 05/24/2014   AST 28 05/24/2014   NA 131* 06/30/2014   K 4.8 06/30/2014   CL 97 06/30/2014   CREATININE 1.06 06/30/2014   BUN 15 06/30/2014   CO2 22 06/30/2014   TSH 3.66 08/27/2009   INR 1.40 06/28/2014   HGBA1C 5.7* 06/27/2014   Stable day, walked in unit 200  feet To step down tomorrow  Grace Isaac MD  Beeper [phone] Office [phone] 06/30/2014 6:46 PM

## 2014-06-30 NOTE — Progress Notes (Signed)
99 mL of urine detected via bladder scanner. Will continue to monitor pt carefully.  Allen Derry, RN

## 2014-06-30 NOTE — Op Note (Signed)
NAMEMARGUETTA, WINDISH                  ACCOUNT NO.:  [id number]  MEDICAL RECORD NO.:  [id number]  LOCATION:  2S09C                        FACILITY:  [facility]  PHYSICIAN:  Lanelle Bal, MD    DATE OF BIRTH:  10/12/1951  DATE OF PROCEDURE:  06/28/2014 DATE OF DISCHARGE:                              OPERATIVE REPORT   PREOPERATIVE DIAGNOSES:  Critical aortic stenosis and coronary occlusive disease.  POSTOPERATIVE DIAGNOSES:  Critical aortic stenosis and coronary occlusive disease.  SURGICAL PROCEDURE:  Coronary artery bypass grafting x2 with left internal mammary to the left anterior descending coronary artery and reverse saphenous vein graft to the distal right coronary artery and aortic valve replacement with pericardial tissue valve, model 3300TFX [employer] 19 mm serial number [id number], attempted endo vein harvesting, and vein harvesting, right lower extremity.  SURGEON:  Lanelle Bal, MD  FIRST ASSISTANT:  John Giovanni, PA-C.  BRIEF HISTORY:  The patient is a 63 year old female who presents with increasing symptoms of chest pain and heart failure and was found on evaluation to have critical aortic stenosis and coronary occlusive disease.  In addition, she had areas of poor dentition.  Prior to surgery, dental extraction was performed, the patient recovered from this well and proceeded to coronary artery bypass grafting because of significant right coronary artery disease and moderate LAD disease was recommended.  The risks and options of surgery were discussed and the use of mechanical versus tissue valve.  The patient preferred not to take Coumadin.  The patient agreed and signed informed consent.  DESCRIPTION OF PROCEDURE:  With Swan-Ganz and arterial line monitors in place, this patient underwent general endotracheal anesthesia without incidence.  Skin of the chest and legs were prepped with Betadine and draped in usual sterile manner.  Initially, a small  incision was made at the right knee.  The saphenous vein at this level was very small, similar incision was made at the left knee and this vein was also small. We then proceeded with open vein harvesting from a single segment of vein from the right lower extremity, it was of good quality and caliber. Median sternotomy was performed.  Left internal mammary artery was dissected down as a pedicle graft.  The distal artery was divided, had good free flow.  The pericardium was opened.  Overall ventricular function appeared preserved.  The patient was systemically heparinized. The ascending aorta was cannulated.  The right atrium was cannulated. The patient was placed on cardiopulmonary bypass 2.4 L/min/m2.  Right superior pulmonary vein vent was placed and retrograde cardioplegic catheters were placed under direct palpation and with guidance of TEE. The patient's body temperature was then cooled to 32 degrees.  Aortic crossclamp was applied and 600 mL of cold blood potassium cardioplegia was administered antegrade with diastolic arrest of the heart. Myocardial septal temperature was monitored throughout the crossclamp. Attention was turned first to the distal right coronary artery, which was opened, admitted a 1.5 mm probe distally.  Using a running 7-0 Prolene, distal anastomosis was performed.  Attention was then turned to the LAD where in the midportion vessel was opened and left internal mammary artery was anastomosed to the left  anterior descending coronary artery with a running 8-0 Prolene.  The patient had significant calcification in the very proximal portion of her aortic root.  The transverse aortotomy was performed in an area relatively free of any calcium.  This gave exposure of a tricuspid calcified aortic valve.  The valve was excised and the annulus debrided.  The patient's entire aorta and aortic root was small consistent with her small body surface area. Because of the amount  of calcium in the aortic root, 21 pericardial valve was not suitable, 19 was selected and felt to be adequate considering the patient's small body surface area.  With interrupted 2-0 Tycron sutures with pledgets on the ventricular surface, the pericardial tissue valve, model 3300TFX [employer] 19 mm serial #[id number] was secured in place and seated well without obstruction of the left or right coronary ostium.  With valve well seated, the aorta was carefully inspected for any loose calcific debris.  The aortotomy was then closed over felt strips with a running 4-0 Prolene horizontal mattress suture, and a second layer of running over and over Prolene suture. Intermittently, cardioplegia was administered both down the right vein graft and also retrograde.  With the crossclamp still in place, a single punch aortotomy was performed and the vein graft to the right coronary artery was anastomosed to the ascending aorta.  Prior to complete closure of this, the heart was allowed to passively fill and de-air. Warm retrograde cardioplegia was also administered.  Aortic cross-clamp was removed with a total crossclamp time of 125 minutes.  The patient spontaneously converted to a sinus rhythm.  The bulldog on the mammary artery had been removed prior to removal of the crossclamp and there was prompt rise in myocardial septal temperature.  The retrograde cardioplegia catheter was removed.  An 18-gauge needle was introduced in the left ventricular apex to further assist in de-airing the heart. With the patient's body temperature rewarmed to 37 degrees, the heart was allowed to fill and showed good functioning of the newly implanted valve.  The right superior pulmonary vein vent was removed.  The patient was then ventilated and weaned from cardiopulmonary bypass without difficulty and remained hemodynamically stable.  She was decannulated in usual fashion.  Protamine sulfate was administered  with the operative field hemostatic.  A left pleural tube and Blake mediastinal drain were left in place.  The atrial and ventricular pacing wires had been applied.  The pericardium was loosely reapproximated.  Graft marker was applied.  Sternum was closed with #6 stainless steel wire.  Fascia was closed with interrupted 0-Vicryl, running 3-0 Vicryl in subcutaneous tissue, 3-0 subcuticular stitch in skin edges.  Dry dressings were applied.  Sponge and needle count was reported as correct at the completion of procedure.  The patient tolerated the procedure without obvious complications and was transferred to the Surgical Intensive Care Unit for further postoperative care.  Pump time was 158 minutes.  The patient did require 2 units of packed red blood cells while on bypass because of low preop hematocrit.     Lanelle Bal, MD     EG/MEDQ  D:  06/30/2014  T:  06/30/2014  Job:  [id number]

## 2014-06-30 NOTE — Progress Notes (Signed)
Patient ID: Carrie [name], female   DOB: 1951-08-16, 63 y.o.   MRN: [id number] TCTS DAILY ICU PROGRESS NOTE                   [address]            [place] [address]          [phone]   2 Days Post-Op Procedure(s) (LRB): Coronary artery bypass grafting on pump using left internal mammary artery to left anterior descending artery, right greater saphenous vein to right coronary artery. ([name]) Aortic valve replacement using [name] Ease 6mm valve Serial number [id number] ([name]) INTRAOPERATIVE TRANSESOPHAGEAL ECHOCARDIOGRAM ([name])  Total Length of Stay:  LOS: 10 days   Subjective: Up to chair , neuro intact  Objective: Vital signs in last 24 hours: Temp:  [98.4 F (36.9 C)-99.5 F (37.5 C)] 98.5 F (36.9 C) (08/09 0808) Pulse Rate:  [63-94] 71 (08/09 0900) Cardiac Rhythm:  [-] Normal sinus rhythm (08/09 0803) Resp:  [10-33] 15 (08/09 0900) BP: (99-144)/(45-95) 131/52 mmHg (08/09 0900) SpO2:  [91 %-98 %] 92 % (08/09 0900) Arterial Line BP: (136-186)/(52-64) 136/52 mmHg (08/08 1500) Weight:  [192 lb 12.8 oz (87.454 kg)] 192 lb 12.8 oz (87.454 kg) (08/09 0500)  Filed Weights   06/28/14 0536 06/29/14 0246 06/30/14 0500  Weight: 173 lb 9.6 oz (78.744 kg) 190 lb 14.7 oz (86.6 kg) 192 lb 12.8 oz (87.454 kg)    Weight change: 1 lb 14.1 oz (0.854 kg)   Hemodynamic parameters for last 24 hours: PAP: (41-48)/(20-24) 48/24 mmHg  Intake/Output from previous day: 08/08 0701 - 08/09 0700 In: 1430 [P.O.:800; I.V.:530; IV Piggyback:100] Out: 1275 [Urine:865; Chest Tube:410]  Intake/Output this shift: Total I/O In: 20 [I.V.:20] Out: 105 [Urine:55; Chest Tube:50]  Current Meds: Scheduled Meds: . acetaminophen  1,000 mg Oral 4 times per day   Or  . acetaminophen (TYLENOL) oral liquid 160 mg/5 mL  1,000 mg Per Tube 4 times per day  . antiseptic oral rinse  7 mL Mouth Rinse BID  . aspirin EC  325 mg Oral Daily   Or  . aspirin  324 mg Per Tube Daily  .  atorvastatin  80 mg Oral q1800  . bisacodyl  10 mg Oral Daily   Or  . bisacodyl  10 mg Rectal Daily  . cefUROXime (ZINACEF)  IV  1.5 g Intravenous Q12H  . chlorhexidine  15 mL Mouth/Throat QID  . docusate sodium  200 mg Oral Daily  . insulin aspart  0-24 Units Subcutaneous 6 times per day  . metoprolol tartrate  12.5 mg Oral BID   Or  . metoprolol tartrate  12.5 mg Per Tube BID  . nystatin cream   Topical BID  . pantoprazole  40 mg Oral Daily  . sodium chloride  3 mL Intravenous Q12H   Continuous Infusions: . sodium chloride 20 mL/hr (06/29/14 1725)  . sodium chloride 10 mL/hr at 06/28/14 1430  . sodium chloride    . dexmedetomidine Stopped (06/28/14 2000)  . lactated ringers Stopped (06/29/14 1200)  . nitroGLYCERIN Stopped (06/29/14 0700)  . phenylephrine (NEO-SYNEPHRINE) Adult infusion Stopped (06/28/14 1410)   PRN Meds:.fentaNYL, metoprolol, midazolam, ondansetron (ZOFRAN) IV, oxyCODONE, sodium chloride  General appearance: alert, cooperative, appears older than stated age and no distress Neurologic: intact Heart: regular rate and rhythm, S1, S2 normal, no murmur, click, rub or gallop Lungs: diminished breath sounds bibasilar Abdomen: soft, non-tender; bowel sounds normal; no masses,  no  organomegaly Extremities: extremities normal, atraumatic, no cyanosis or edema and Homans sign is negative, no sign of DVT Wound: sternum intact  Lab Results: CBC: Recent Labs  06/29/14 1508 06/29/14 1533 06/30/14 0415  WBC 12.1*  --  12.9*  HGB 9.2* 9.9* 9.6*  HCT 27.7* 29.0* 29.4*  PLT 80*  --  86*   BMET:  Recent Labs  06/29/14 0400  06/29/14 1533 06/30/14 0415  NA 137  --  134* 131*  K 4.3  --  4.4 4.8  CL 104  --  101 97  CO2 20  --   --  22  GLUCOSE 109*  --  182* 120*  BUN 12  --  12 15  CREATININE 1.08  < > 1.10 1.06  CALCIUM 8.1*  --   --  8.2*  < > = values in this interval not displayed.  PT/INR:  Recent Labs  06/28/14 1430  LABPROT 17.2*  INR 1.40    Radiology: Dg Chest Port 1 View  06/30/2014   CLINICAL DATA:  CABG and aortic valve replacement.  EXAM: PORTABLE CHEST - 1 VIEW  COMPARISON:  06/29/2014 and prior chest radiograph  FINDINGS: Cardiomegaly and evidence of CABG/aortic valve replacement noted.  There is no evidence of pulmonary edema.  Left basilar basilar atelectasis is noted.  A left thoracostomy tube is noted without evidence of pneumothorax.  Swan-Ganz catheter and mediastinal drain have been removed. A right IJ central venous catheter sheath remains.  No other interval change identified.  IMPRESSION: Postoperative changes and support apparatus as described. No evidence of pneumothorax.  Continued left lower lung atelectasis.   Electronically Signed   By: Hassan Rowan M.D.   On: 06/30/2014 08:13   Dg Chest Portable 1 View In Am  06/29/2014   CLINICAL DATA:  Status post median sternotomy.  EXAM: PORTABLE CHEST - 1 VIEW  COMPARISON:  One-view chest 06/28/2014.  FINDINGS: Patient has been extubated. A mediastinal drain and left-sided chest tubes remain in place. There is no pneumothorax. A Swan-Ganz catheter is stable in position. Overall aeration has improved. A small left pleural effusion is evident. Left basilar atelectasis is noted.  IMPRESSION: 1. Interval extubation. 2. Improved aeration. 3. Residual small left pleural effusion and associated atelectasis. 4. The support apparatus is otherwise stable. Left-sided chest tube is in place without pneumothorax.   Electronically Signed   By: Lawrence Santiago M.D.   On: 06/29/2014 09:40   Dg Chest Portable 1 View  06/28/2014   CLINICAL DATA:  Postop CABG and aortic valve replacement.  EXAM: PORTABLE CHEST - 1 VIEW  COMPARISON:  06/27/2014  FINDINGS: Sequelae of interval CABG and aortic valve replacement are identified. Endotracheal tube tip projects approximately 5 cm above the carina. Right jugular Swan-Ganz catheter terminates over the distal main pulmonary artery. Mediastinal tube and left chest  tube are in place. Enteric tube loops in the left upper abdomen with tip not imaged.  Cardiac silhouette appears upper limits of normal in size. Thoracic aortic calcification is noted. There is mild pulmonary vascular congestion. Diffusely increased interstitial markings are more prominent than on the prior study. No definite pleural effusion is identified, although a trace left pleural effusion is questioned. No pneumothorax is identified.  IMPRESSION: 1. Interval postoperative changes with support devices as above. 2. Pulmonary vascular congestion with likely mild interstitial edema.   Electronically Signed   By: Logan Bores   On: 06/28/2014 14:43     Assessment/Plan: S/P Procedure(s) (LRB): Coronary artery  bypass grafting on pump using left internal mammary artery to left anterior descending artery, right greater saphenous vein to right coronary artery. ([name]) Aortic valve replacement using [facility] Ease 61mm valve Serial number [id number] ([name]) INTRAOPERATIVE TRANSESOPHAGEAL ECHOCARDIOGRAM ([name]) Mobilize Diuresis plts still low, avoid heparin for now D/c chest tubes Mild diuresis     Satin Boal B 06/30/2014 9:29 AM

## 2014-07-01 ENCOUNTER — Inpatient Hospital Stay (HOSPITAL_COMMUNITY): Payer: Medicare Other

## 2014-07-01 ENCOUNTER — Encounter (HOSPITAL_COMMUNITY): Payer: Self-pay | Admitting: Cardiothoracic Surgery

## 2014-07-01 LAB — TYPE AND SCREEN
ABO/RH(D): A POS
Antibody Screen: NEGATIVE
UNIT DIVISION: 0
Unit division: 0
Unit division: 0
Unit division: 0

## 2014-07-01 LAB — BASIC METABOLIC PANEL
Anion gap: 9 (ref 5–15)
BUN: 20 mg/dL (ref 6–23)
CO2: 25 mEq/L (ref 19–32)
Calcium: 8.4 mg/dL (ref 8.4–10.5)
Chloride: 101 mEq/L (ref 96–112)
Creatinine, Ser: 1.02 mg/dL (ref 0.50–1.10)
GFR calc Af Amer: 65 mL/min — ABNORMAL LOW (ref >90)
GFR calc non Af Amer: 56 mL/min — ABNORMAL LOW (ref >90)
Glucose, Bld: 117 mg/dL — ABNORMAL HIGH (ref 70–99)
Potassium: 4.7 mEq/L (ref 3.7–5.3)
Sodium: 135 mEq/L — ABNORMAL LOW (ref 137–147)

## 2014-07-01 LAB — CBC
HCT: 27.2 % — ABNORMAL LOW (ref 36.0–46.0)
Hemoglobin: 8.9 g/dL — ABNORMAL LOW (ref 12.0–15.0)
MCH: 30.4 pg (ref 26.0–34.0)
MCHC: 32.7 g/dL (ref 30.0–36.0)
MCV: 92.8 fL (ref 78.0–100.0)
Platelets: 104 10*3/uL — ABNORMAL LOW (ref 150–400)
RBC: 2.93 MIL/uL — ABNORMAL LOW (ref 3.87–5.11)
RDW: 15.4 % (ref 11.5–15.5)
WBC: 10.1 10*3/uL (ref 4.0–10.5)

## 2014-07-01 LAB — GLUCOSE, CAPILLARY
GLUCOSE-CAPILLARY: 114 mg/dL — AB (ref 70–99)
Glucose-Capillary: 102 mg/dL — ABNORMAL HIGH (ref 70–99)
Glucose-Capillary: 111 mg/dL — ABNORMAL HIGH (ref 70–99)
Glucose-Capillary: 117 mg/dL — ABNORMAL HIGH (ref 70–99)
Glucose-Capillary: 123 mg/dL — ABNORMAL HIGH (ref 70–99)
Glucose-Capillary: 144 mg/dL — ABNORMAL HIGH (ref 70–99)

## 2014-07-01 MED ORDER — DOCUSATE SODIUM 100 MG PO CAPS
200.0000 mg | ORAL_CAPSULE | Freq: Every day | ORAL | Status: DC
  Administered 2014-07-02 – 2014-07-06 (×5): 200 mg via ORAL
  Filled 2014-07-01 (×5): qty 2

## 2014-07-01 MED ORDER — LISINOPRIL 2.5 MG PO TABS
2.5000 mg | ORAL_TABLET | Freq: Every day | ORAL | Status: DC
  Administered 2014-07-01 – 2014-07-06 (×6): 2.5 mg via ORAL
  Filled 2014-07-01 (×6): qty 1

## 2014-07-01 MED ORDER — OXYCODONE HCL 5 MG PO TABS
5.0000 mg | ORAL_TABLET | ORAL | Status: DC | PRN
  Administered 2014-07-01 (×3): 10 mg via ORAL
  Administered 2014-07-02: 5 mg via ORAL
  Administered 2014-07-02 – 2014-07-06 (×27): 10 mg via ORAL
  Filled 2014-07-01 (×31): qty 2

## 2014-07-01 MED ORDER — PANTOPRAZOLE SODIUM 40 MG PO TBEC
40.0000 mg | DELAYED_RELEASE_TABLET | Freq: Every day | ORAL | Status: DC
  Administered 2014-07-02 – 2014-07-06 (×5): 40 mg via ORAL
  Filled 2014-07-01 (×5): qty 1

## 2014-07-01 MED ORDER — MOVING RIGHT ALONG BOOK
Freq: Once | Status: AC
  Administered 2014-07-01: 12:00:00
  Filled 2014-07-01: qty 1

## 2014-07-01 MED ORDER — TRAMADOL HCL 50 MG PO TABS: 50.0000 mg | ORAL_TABLET | ORAL | Status: DC | PRN

## 2014-07-01 MED ORDER — BISACODYL 10 MG RE SUPP: 10.0000 mg | Freq: Every day | RECTAL | Status: DC | PRN

## 2014-07-01 MED ORDER — ONDANSETRON HCL 4 MG/2ML IJ SOLN: 4.0000 mg | Freq: Four times a day (QID) | INTRAMUSCULAR | Status: DC | PRN

## 2014-07-01 MED ORDER — ONDANSETRON HCL 4 MG PO TABS
4.0000 mg | ORAL_TABLET | Freq: Four times a day (QID) | ORAL | Status: DC | PRN
  Administered 2014-07-06: 4 mg via ORAL
  Filled 2014-07-01: qty 1

## 2014-07-01 MED ORDER — METOPROLOL TARTRATE 12.5 MG HALF TABLET
12.5000 mg | ORAL_TABLET | Freq: Two times a day (BID) | ORAL | Status: DC
  Administered 2014-07-01 – 2014-07-02 (×2): 12.5 mg via ORAL
  Filled 2014-07-01 (×3): qty 1

## 2014-07-01 MED ORDER — ASPIRIN EC 325 MG PO TBEC
325.0000 mg | DELAYED_RELEASE_TABLET | Freq: Every day | ORAL | Status: DC
  Administered 2014-07-02 – 2014-07-06 (×5): 325 mg via ORAL
  Filled 2014-07-01 (×5): qty 1

## 2014-07-01 MED ORDER — BISACODYL 5 MG PO TBEC
10.0000 mg | DELAYED_RELEASE_TABLET | Freq: Every day | ORAL | Status: DC | PRN
  Administered 2014-07-02 – 2014-07-05 (×3): 10 mg via ORAL
  Filled 2014-07-01 (×3): qty 2

## 2014-07-01 MED ORDER — ENOXAPARIN SODIUM 40 MG/0.4ML ~~LOC~~ SOLN
40.0000 mg | SUBCUTANEOUS | Status: DC
  Administered 2014-07-01 – 2014-07-06 (×6): 40 mg via SUBCUTANEOUS
  Filled 2014-07-01 (×6): qty 0.4

## 2014-07-01 MED ORDER — GUAIFENESIN ER 600 MG PO TB12
600.0000 mg | ORAL_TABLET | Freq: Two times a day (BID) | ORAL | Status: DC | PRN
  Filled 2014-07-01: qty 1

## 2014-07-01 MED ORDER — SODIUM CHLORIDE 0.9 % IJ SOLN
3.0000 mL | Freq: Two times a day (BID) | INTRAMUSCULAR | Status: DC
  Administered 2014-07-01 – 2014-07-06 (×9): 3 mL via INTRAVENOUS

## 2014-07-01 MED ORDER — SODIUM CHLORIDE 0.9 % IV SOLN: 250.0000 mL | INTRAVENOUS | Status: DC | PRN

## 2014-07-01 MED ORDER — INSULIN ASPART 100 UNIT/ML ~~LOC~~ SOLN
0.0000 [IU] | Freq: Three times a day (TID) | SUBCUTANEOUS | Status: DC
  Administered 2014-07-01 – 2014-07-03 (×3): 2 [IU] via SUBCUTANEOUS

## 2014-07-01 MED ORDER — SODIUM CHLORIDE 0.9 % IJ SOLN: 3.0000 mL | INTRAMUSCULAR | Status: DC | PRN

## 2014-07-01 MED ORDER — FUROSEMIDE 20 MG PO TABS
20.0000 mg | ORAL_TABLET | Freq: Every day | ORAL | Status: AC
  Administered 2014-07-01 – 2014-07-03 (×3): 20 mg via ORAL
  Filled 2014-07-01 (×3): qty 1

## 2014-07-01 MED FILL — Sodium Bicarbonate IV Soln 8.4%: INTRAVENOUS | Qty: 50 | Status: AC

## 2014-07-01 MED FILL — Electrolyte-R (PH 7.4) Solution: INTRAVENOUS | Qty: 4000 | Status: AC

## 2014-07-01 MED FILL — Sodium Chloride IV Soln 0.9%: INTRAVENOUS | Qty: 2000 | Status: AC

## 2014-07-01 MED FILL — Lidocaine HCl IV Inj 20 MG/ML: INTRAVENOUS | Qty: 5 | Status: AC

## 2014-07-01 MED FILL — Heparin Sodium (Porcine) Inj 1000 Unit/ML: INTRAMUSCULAR | Qty: 10 | Status: AC

## 2014-07-01 NOTE — Progress Notes (Signed)
Patient ID: Carrie [name], female   DOB: February 03, 1951, 63 y.o.   MRN: [id number] TCTS DAILY ICU PROGRESS NOTE                   [address]            [place] [address]          [phone]   3 Days Post-Op Procedure(s) (LRB): Coronary artery bypass grafting on pump using left internal mammary artery to left anterior descending artery, right greater saphenous vein to right coronary artery. ([name]) Aortic valve replacement using [name] Ease 60mm valve Serial number [id number] ([name]) INTRAOPERATIVE TRANSESOPHAGEAL ECHOCARDIOGRAM ([name])  Total Length of Stay:  LOS: 11 days   Subjective: Feels well, walked around unit this am  Objective: Vital signs in last 24 hours: Temp:  [97.5 F (36.4 C)-98.5 F (36.9 C)] 98.1 F (36.7 C) (08/10 0400) Pulse Rate:  [59-87] 87 (08/10 0700) Cardiac Rhythm:  [-] Normal sinus rhythm (08/10 0600) Resp:  [7-28] 17 (08/10 0700) BP: (82-136)/(38-92) 124/76 mmHg (08/10 0700) SpO2:  [92 %-100 %] 100 % (08/10 0700) Weight:  [190 lb 14.7 oz (86.6 kg)] 190 lb 14.7 oz (86.6 kg) (08/10 0500)  Filed Weights   06/29/14 0246 06/30/14 0500 07/01/14 0500  Weight: 190 lb 14.7 oz (86.6 kg) 192 lb 12.8 oz (87.454 kg) 190 lb 14.7 oz (86.6 kg)    Weight change: -1 lb 14.1 oz (-0.854 kg)   Hemodynamic parameters for last 24 hours:    Intake/Output from previous day: 08/09 0701 - 08/10 0700 In: 1010 [P.O.:600; I.V.:360; IV Piggyback:50] Out: 2275 [Urine:2205; Chest Tube:70]  Intake/Output this shift:    Current Meds: Scheduled Meds: . acetaminophen  1,000 mg Oral 4 times per day   Or  . acetaminophen (TYLENOL) oral liquid 160 mg/5 mL  1,000 mg Per Tube 4 times per day  . antiseptic oral rinse  7 mL Mouth Rinse BID  . aspirin EC  325 mg Oral Daily   Or  . aspirin  324 mg Per Tube Daily  . atorvastatin  80 mg Oral q1800  . bisacodyl  10 mg Oral Daily   Or  . bisacodyl  10 mg Rectal Daily  . chlorhexidine  15 mL Mouth/Throat QID  .  docusate sodium  200 mg Oral Daily  . insulin aspart  0-24 Units Subcutaneous 6 times per day  . metoprolol tartrate  12.5 mg Oral BID   Or  . metoprolol tartrate  12.5 mg Per Tube BID  . nystatin cream   Topical BID  . pantoprazole  40 mg Oral Daily  . sodium chloride  3 mL Intravenous Q12H   Continuous Infusions: . sodium chloride 20 mL/hr (06/29/14 1725)  . sodium chloride 10 mL/hr at 06/28/14 1430  . sodium chloride    . dexmedetomidine Stopped (06/28/14 2000)  . lactated ringers Stopped (06/29/14 1200)  . nitroGLYCERIN Stopped (06/29/14 0700)  . phenylephrine (NEO-SYNEPHRINE) Adult infusion Stopped (06/28/14 1410)   PRN Meds:.fentaNYL, metoprolol, midazolam, ondansetron (ZOFRAN) IV, oxyCODONE, sodium chloride, sodium chloride  General appearance: alert and cooperative Neurologic: intact Heart: regular rate and rhythm, S1, S2 normal, no murmur, click, rub or gallop Lungs: diminished breath sounds bibasilar Abdomen: soft, non-tender; bowel sounds normal; no masses,  no organomegaly Extremities: extremities normal, atraumatic, no cyanosis or edema and Homans sign is negative, no sign of DVT Wound: sternum stable  Lab Results: CBC: Recent Labs  06/30/14 0415 07/01/14 0500  WBC 12.9* 10.1  HGB 9.6* 8.9*  HCT 29.4* 27.2*  PLT 86* 104*   BMET:  Recent Labs  06/30/14 0415 07/01/14 0500  NA 131* 135*  K 4.8 4.7  CL 97 101  CO2 22 25  GLUCOSE 120* 117*  BUN 15 20  CREATININE 1.06 1.02  CALCIUM 8.2* 8.4    PT/INR:  Recent Labs  06/28/14 1430  LABPROT 17.2*  INR 1.40   Radiology: Dg Chest Port 1 View  06/30/2014   CLINICAL DATA:  CABG and aortic valve replacement.  EXAM: PORTABLE CHEST - 1 VIEW  COMPARISON:  06/29/2014 and prior chest radiograph  FINDINGS: Cardiomegaly and evidence of CABG/aortic valve replacement noted.  There is no evidence of pulmonary edema.  Left basilar basilar atelectasis is noted.  A left thoracostomy tube is noted without evidence of  pneumothorax.  Swan-Ganz catheter and mediastinal drain have been removed. A right IJ central venous catheter sheath remains.  No other interval change identified.  IMPRESSION: Postoperative changes and support apparatus as described. No evidence of pneumothorax.  Continued left lower lung atelectasis.   Electronically Signed   By: Hassan Rowan M.D.   On: 06/30/2014 08:13   Dg Chest Portable 1 View In Am  06/29/2014   CLINICAL DATA:  Status post median sternotomy.  EXAM: PORTABLE CHEST - 1 VIEW  COMPARISON:  One-view chest 06/28/2014.  FINDINGS: Patient has been extubated. A mediastinal drain and left-sided chest tubes remain in place. There is no pneumothorax. A Swan-Ganz catheter is stable in position. Overall aeration has improved. A small left pleural effusion is evident. Left basilar atelectasis is noted.  IMPRESSION: 1. Interval extubation. 2. Improved aeration. 3. Residual small left pleural effusion and associated atelectasis. 4. The support apparatus is otherwise stable. Left-sided chest tube is in place without pneumothorax.   Electronically Signed   By: Lawrence Santiago M.D.   On: 06/29/2014 09:40     Assessment/Plan: S/P Procedure(s) (LRB): Coronary artery bypass grafting on pump using left internal mammary artery to left anterior descending artery, right greater saphenous vein to right coronary artery. ([name]) Aortic valve replacement using [facility] Ease 53mm valve Serial number [id number] ([name]) INTRAOPERATIVE TRANSESOPHAGEAL ECHOCARDIOGRAM ([name]) Mobilize Diuresis Continue foley due to acute urinary retention Plan for transfer to step-down: see transfer orders     [name] 07/01/2014 7:40 AM

## 2014-07-01 NOTE — Progress Notes (Signed)
Pt received into room 2w24, pt resting in bed with no complaints, tele placed on pt, pt oriented to room and call bell, will continue to monitor  Rickard Rhymes, RN

## 2014-07-01 NOTE — Progress Notes (Signed)
CARDIAC REHAB PHASE I   PRE:  Rate/Rhythm: 90 SR  BP:  Supine:   Sitting: 170/60  Standing:    SaO2: 93 % 2L  MODE:  Ambulation: 300 ft   POST:  Rate/Rhythm: 112 ST  BP:  Supine:   Sitting: 140/52  Standing:    SaO2: 93% 2L 1350-1440 Pt tearful when I entered room. Stated her father[place]in[place] died this morning. Gave pt emotional support and offered to come back. Stated surgeon wanted her to walk a couple more times today and she would. Pt very anxious with movement. Tried to get pt to take deep breaths and relax while walking. Husband walked with [place] at pt's request. Pt stopped many times during walk due to pain from back and weakness in legs. Back to bed at pt's request. Pt not as upset after walk and BP down. Got pt comfortable and notified RN that pt wanted something for pain. Walked 300 ft on 2L with rolling walker and asst x 1. Foley intact.   Graylon Good, RN BSN  07/01/2014 2:35 PM

## 2014-07-01 NOTE — Progress Notes (Signed)
Pt attempted to urinate two times between 2130 and 2330 with no urine output.  Another bladder scan was obtained with the result of approximately 690 mL in the bladder.  Per Dr. Everrett Coombe note, which states that the foley may be replaced if the patient goes 10 hours without voiding another foley catheter was inserted using sterile technique.  Initially 600 mL urine was collected.  Will continue to monitor carefully.  Allen Derry, RN.

## 2014-07-02 ENCOUNTER — Other Ambulatory Visit: Payer: Self-pay

## 2014-07-02 ENCOUNTER — Inpatient Hospital Stay (HOSPITAL_COMMUNITY): Payer: Medicare Other

## 2014-07-02 DIAGNOSIS — Z954 Presence of other heart-valve replacement: Secondary | ICD-10-CM

## 2014-07-02 DIAGNOSIS — Z951 Presence of aortocoronary bypass graft: Secondary | ICD-10-CM

## 2014-07-02 LAB — BASIC METABOLIC PANEL
Anion gap: 12 (ref 5–15)
BUN: 21 mg/dL (ref 6–23)
CO2: 25 mEq/L (ref 19–32)
Calcium: 8.4 mg/dL (ref 8.4–10.5)
Chloride: 99 mEq/L (ref 96–112)
Creatinine, Ser: 1.06 mg/dL (ref 0.50–1.10)
GFR calc Af Amer: 62 mL/min — ABNORMAL LOW (ref >90)
GFR calc non Af Amer: 54 mL/min — ABNORMAL LOW (ref >90)
Glucose, Bld: 116 mg/dL — ABNORMAL HIGH (ref 70–99)
Potassium: 4.5 mEq/L (ref 3.7–5.3)
Sodium: 136 mEq/L — ABNORMAL LOW (ref 137–147)

## 2014-07-02 LAB — CBC
HCT: 28.4 % — ABNORMAL LOW (ref 36.0–46.0)
Hemoglobin: 9.1 g/dL — ABNORMAL LOW (ref 12.0–15.0)
MCH: 30.6 pg (ref 26.0–34.0)
MCHC: 32 g/dL (ref 30.0–36.0)
MCV: 95.6 fL (ref 78.0–100.0)
Platelets: 149 10*3/uL — ABNORMAL LOW (ref 150–400)
RBC: 2.97 MIL/uL — ABNORMAL LOW (ref 3.87–5.11)
RDW: 15.3 % (ref 11.5–15.5)
WBC: 8.5 10*3/uL (ref 4.0–10.5)

## 2014-07-02 LAB — GLUCOSE, CAPILLARY
GLUCOSE-CAPILLARY: 102 mg/dL — AB (ref 70–99)
GLUCOSE-CAPILLARY: 116 mg/dL — AB (ref 70–99)
Glucose-Capillary: 102 mg/dL — ABNORMAL HIGH (ref 70–99)
Glucose-Capillary: 112 mg/dL — ABNORMAL HIGH (ref 70–99)

## 2014-07-02 MED ORDER — METOPROLOL TARTRATE 25 MG PO TABS
25.0000 mg | ORAL_TABLET | Freq: Two times a day (BID) | ORAL | Status: DC
  Administered 2014-07-02 – 2014-07-06 (×9): 25 mg via ORAL
  Filled 2014-07-02 (×10): qty 1

## 2014-07-02 MED ORDER — AMIODARONE IV BOLUS ONLY 150 MG/100ML
150.0000 mg | Freq: Once | INTRAVENOUS | Status: AC
  Administered 2014-07-02: 150 mg via INTRAVENOUS
  Filled 2014-07-02: qty 100

## 2014-07-02 MED ORDER — ENSURE COMPLETE PO LIQD
237.0000 mL | Freq: Two times a day (BID) | ORAL | Status: DC
  Administered 2014-07-03 – 2014-07-05 (×4): 237 mL via ORAL

## 2014-07-02 MED ORDER — AMIODARONE HCL 200 MG PO TABS
400.0000 mg | ORAL_TABLET | Freq: Two times a day (BID) | ORAL | Status: DC
Start: 2014-07-02 — End: 2014-07-06
  Administered 2014-07-02 – 2014-07-06 (×9): 400 mg via ORAL
  Filled 2014-07-02 (×10): qty 2

## 2014-07-02 MED ORDER — METOPROLOL TARTRATE 1 MG/ML IV SOLN: 2.5000 mg | INTRAVENOUS | Status: DC | PRN

## 2014-07-02 MED ORDER — METOPROLOL TARTRATE 1 MG/ML IV SOLN
INTRAVENOUS | Status: AC
  Filled 2014-07-02: qty 5

## 2014-07-02 MED FILL — Heparin Sodium (Porcine) Inj 1000 Unit/ML: INTRAMUSCULAR | Qty: 30 | Status: AC

## 2014-07-02 MED FILL — Magnesium Sulfate Inj 50%: INTRAMUSCULAR | Qty: 10 | Status: AC

## 2014-07-02 MED FILL — Potassium Chloride Inj 2 mEq/ML: INTRAVENOUS | Qty: 40 | Status: AC

## 2014-07-02 NOTE — Progress Notes (Addendum)
[address]       [place] [address]             [phone]      4 Days Post-Op Procedure(s) (LRB): Coronary artery bypass grafting on pump using left internal mammary artery to left anterior descending artery, right greater saphenous vein to right coronary artery. ([name]) Aortic valve replacement using [name] Ease 90mm valve Serial number [id number] ([name]) INTRAOPERATIVE TRANSESOPHAGEAL ECHOCARDIOGRAM ([name]) Subjective: Doing ok, in and out of rapid afib, currently in sinus rhythm  Objective: Vital signs in last 24 hours: Temp:  [97.9 F (36.6 C)-98.3 F (36.8 C)] 97.9 F (36.6 C) (08/11 0632) Pulse Rate:  [75-88] 78 (08/11 0632) Cardiac Rhythm:  [-] Normal sinus rhythm (08/10 2000) Resp:  [15-19] 18 (08/11 0632) BP: (118-148)/(50-70) 148/59 mmHg (08/11 0632) SpO2:  [95 %-96 %] 96 % (08/11 0632) Weight:  [187 lb 13.3 oz (85.2 kg)] 187 lb 13.3 oz (85.2 kg) (08/11 2549)  Hemodynamic parameters for last 24 hours:    Intake/Output from previous day: 08/10 0701 - 08/11 0700 In: 20 [I.V.:20] Out: 1850 [Urine:1850] Intake/Output this shift:    General appearance: alert, cooperative and no distress Heart: regular rate and rhythm Lungs: dim in lower fields Abdomen: benign Extremities: + LE edema Wound: incis healing well  Lab Results:  Recent Labs  07/01/14 0500 07/02/14 0340  WBC 10.1 8.5  HGB 8.9* 9.1*  HCT 27.2* 28.4*  PLT 104* 149*   BMET:  Recent Labs  07/01/14 0500 07/02/14 0340  NA 135* 136*  K 4.7 4.5  CL 101 99  CO2 25 25  GLUCOSE 117* 116*  BUN 20 21  CREATININE 1.02 1.06  CALCIUM 8.4 8.4    PT/INR: No results found for this basename: LABPROT, INR,  in the last 72 hours ABG    Component Value Date/Time   PHART 7.335* 06/29/2014 0005   HCO3 21.2 06/29/2014 0005   TCO2 19 06/29/2014 1533   ACIDBASEDEF 4.0* 06/29/2014 0005   O2SAT 93.0 06/29/2014 0005   CBG (last 3)   Recent Labs  07/01/14 1623 07/01/14 2148  07/02/14 0630  GLUCAP 123* 117* 102*   Scheduled Meds: . antiseptic oral rinse  7 mL Mouth Rinse BID  . aspirin EC  325 mg Oral Daily  . atorvastatin  80 mg Oral q1800  . docusate sodium  200 mg Oral Daily  . enoxaparin (LOVENOX) injection  40 mg Subcutaneous Q24H  . furosemide  20 mg Oral Daily  . insulin aspart  0-24 Units Subcutaneous TID AC & HS  . lisinopril  2.5 mg Oral Daily  . metoprolol tartrate  12.5 mg Oral BID  . nystatin cream   Topical BID  . pantoprazole  40 mg Oral QAC breakfast  . sodium chloride  3 mL Intravenous Q12H   Continuous Infusions:  PRN Meds:.sodium chloride, bisacodyl, bisacodyl, guaiFENesin, ondansetron (ZOFRAN) IV, ondansetron, oxyCODONE, sodium chloride, traMADol  Dg Chest Port 1 View  07/01/2014   CLINICAL DATA:  Evaluate chest tube.  EXAM: PORTABLE CHEST - 1 VIEW  COMPARISON:  1 day prior  FINDINGS: Removal of left-sided chest tube. Right internal jugular Cordis sheath remains in place.  Midline trachea. Cardiomegaly accentuated by AP portable technique. No pleural effusion or pneumothorax. No congestive failure. Patchy atelectasis at the left lung base is slightly increased.  IMPRESSION: Removal of left-sided chest tube, without evidence of pneumothorax.  Cardiomegaly with slight increase in left base subsegmental atelectasis.  Electronically Signed   By: Abigail Miyamoto M.D.   On: 07/01/2014 07:53    Assessment/Plan: S/P Procedure(s) (LRB): Coronary artery bypass grafting on pump using left internal mammary artery to left anterior descending artery, right greater saphenous vein to right coronary artery. ([name]) Aortic valve replacement using [facility] Ease 6mm valve Serial number [id number] ([name]) INTRAOPERATIVE TRANSESOPHAGEAL ECHOCARDIOGRAM ([name])  1 will start po amiodarone 2 cont gentle diuresis 3 labs stable 4 sugars controlled 5 push rehab/pulm toilet     LOS: 12 days    GOLD,[name] 07/02/2014  developed atrial fib with rapid rate  today started on po Cordarone and given bolus iv , now holding sinus Foley out today I have seen and examined Jonelle Sidle and agree with the above assessment  and plan.  Grace Isaac MD Beeper [phone] Office [phone] 07/02/2014 4:20 PM

## 2014-07-02 NOTE — Progress Notes (Signed)
Increase HR on tele. RN assessed pt HR was sustaining in 150s-160s.  EKG obtained and pt found to be in A Fib with RVR. BP 141/54. 96% on 2L. PA notified of change in patient's status. Orders received. Will carry out orders. Will continue to monitor pt closely.

## 2014-07-02 NOTE — Progress Notes (Signed)
Pt converted into SR with rate in the 70s-80s. PA notified and orders received.  Will continue to monitor pt closely and carry out orders.

## 2014-07-02 NOTE — Progress Notes (Signed)
CARDIAC REHAB PHASE I   PRE:  Rate/Rhythm: 80 SR  BP:  Supine: 160/66  Sitting:   Standing:    SaO2: 90 % then 87% RA    Has been on 3L  MODE:  Ambulation: 550 ft   POST:  Rate/Rhythm: 109 ST  BP:  Supine:   Sitting: 160/60  Standing:    SaO2: 92%2L 1135-1215 Pt walked 550 ft on 2L with rolling walker and asst x 1. Desat in room on RA so had to walk on oxygen. Stopped many times to rest. Some DOE. Pt tolerated increase in distance well and proud of her accomplishment. To recliner after walk. Left on 2L.   Graylon Good, RN BSN  07/02/2014 12:10 PM

## 2014-07-02 NOTE — Progress Notes (Signed)
NUTRITION FOLLOW UP  INTERVENTION: Ensure Complete po BID, each supplement provides 350 kcal and 13 grams of protein RD to follow for nutrition care plan  NUTRITION DIAGNOSIS: Inadequate oral intake related to dysphagia from recent extraction of 6 teeth as evidenced by PO intake 10-50%, ongoing  Goal: Intake to meet >90% of estimated nutrition needs, progressing  Monitor:  PO & supplemental intake, weight, labs, I/O's  ASSESSMENT: 63 y.o. female with a h/o hyperlipidemia, hypertension, and ongoing tobacco use who presents for cardiac surgery with a diagnosis of chest pain and severe aortic stenosis.  8/4:  S/P dental extractions x 6 earlier today in preparation for open heart surgery later this week. Patient reports that she has been eating well PTA, stable weight and good appetite PTA. Nutrition focused physical exam completed.  No muscle or subcutaneous fat depletion noticed.  8/11:  Pt s/p CABG & AVR 8/7.  Transferred from SICU 8/10.  PO intake variable at 10-50% per flowsheet records.  Pt was receiving [employer] oral nutrition supplement prior to surgery.  Would benefit from addition at this time.  RD to order.  Height: Ht Readings from Last 1 Encounters:  06/20/14 5' 5.5" (1.664 m)    Weight: Wt Readings from Last 1 Encounters:  07/02/14 187 lb 13.3 oz (85.2 kg)    BMI:  Body mass index is 30.77 kg/(m^2).  Estimated Nutritional Needs: Kcal: 1700-1900 Protein: 85-100 gm Fluid: 1.8 L  Skin: Intact  Diet Order: Heart Healthy/Carbohydrate Modified    Intake/Output Summary (Last 24 hours) at 07/02/14 1200 Last data filed at 07/02/14 1100  Gross per 24 hour  Intake    200 ml  Output   2100 ml  Net  -1900 ml    Labs:   Recent Labs Lab 06/28/14 2005 06/29/14 0400 06/29/14 1508  06/30/14 0415 07/01/14 0500 07/02/14 0340  NA  --  137  --   < > 131* 135* 136*  K  --  4.3  --   < > 4.8 4.7 4.5  CL  --  104  --   < > 97 101 99  CO2  --  20  --   --  22  25 25   BUN  --  12  --   < > 15 20 21   CREATININE 1.01 1.08 1.03  < > 1.06 1.02 1.06  CALCIUM  --  8.1*  --   --  8.2* 8.4 8.4  MG 2.9* 2.4 2.4  --   --   --   --   GLUCOSE  --  109*  --   < > 120* 117* 116*  < > = values in this interval not displayed.  CBG (last 3)   Recent Labs  07/01/14 2148 07/02/14 0630 07/02/14 1114  GLUCAP 117* 102* 102*    Scheduled Meds: . amiodarone  400 mg Oral BID  . antiseptic oral rinse  7 mL Mouth Rinse BID  . aspirin EC  325 mg Oral Daily  . atorvastatin  80 mg Oral q1800  . docusate sodium  200 mg Oral Daily  . enoxaparin (LOVENOX) injection  40 mg Subcutaneous Q24H  . furosemide  20 mg Oral Daily  . insulin aspart  0-24 Units Subcutaneous TID AC & HS  . lisinopril  2.5 mg Oral Daily  . metoprolol tartrate  12.5 mg Oral BID  . nystatin cream   Topical BID  . pantoprazole  40 mg Oral QAC breakfast  . sodium chloride  3 mL Intravenous Q12H    Continuous Infusions:    Past Medical History  Diagnosis Date  . Hypertension   . Hyperlipidemia   . Carotid bruit   . Sinus bradycardia   . Palpitations   . Tobacco abuse   . Heart murmur   . DDD (degenerative disc disease), lumbar   . Chronic lower back pain   . Coronary artery disease     Past Surgical History  Procedure Laterality Date  . Heel spur surgery Right   . Cataract extraction w/ intraocular lens  implant, bilateral Bilateral ~ 02/2014  . Hip orif w/ capsulotomy Left 1980's  . Tonsillectomy  1978  . Cardiac catheterization  06/20/2014  . Abdominal hysterectomy  1970's  . Multiple extractions with alveoloplasty [name] 06/25/2014    Procedure: Extraction of tooth #'s 2,3,4,14, 18, 19 with alveoloplasty and gross debridement of remaining teeth;  Surgeon: Lenn Cal, DDS;  Location: [place];  Service: Oral Surgery;  Laterality: [name];  . Coronary artery bypass graft [name] 06/28/2014    Procedure: Coronary artery bypass grafting on pump using left internal mammary artery to left  anterior descending artery, right greater saphenous vein to right coronary artery.;  Surgeon: Grace Isaac, MD;  Location: [place];  Service: Open Heart Surgery;  Laterality: [name];  . Aortic valve replacement [name] 06/28/2014    Procedure: Aortic valve replacement using [facility] Ease 43mm valve Serial number [id number];  Surgeon: Grace Isaac, MD;  Location: [place];  Service: Open Heart Surgery;  Laterality: [name];  . Intraoperative transesophageal echocardiogram [name] 06/28/2014    Procedure: INTRAOPERATIVE TRANSESOPHAGEAL ECHOCARDIOGRAM;  Surgeon: Grace Isaac, MD;  Location: [place];  Service: Open Heart Surgery;  Laterality: [name];    Arthur Holms, RD, LDN Pager #: [phone] After-Hours Pager #: [phone]

## 2014-07-02 NOTE — Progress Notes (Signed)
Pt amb 150 ft pushing walker on 2L. Pt stopped twice for rest breaks. Pt assisted to edge of bed and then into bed. Will continue to monitor pt closely.

## 2014-07-03 LAB — URINALYSIS, ROUTINE W REFLEX MICROSCOPIC
Bilirubin Urine: NEGATIVE
GLUCOSE, UA: NEGATIVE mg/dL
Hgb urine dipstick: NEGATIVE
KETONES UR: NEGATIVE mg/dL
Leukocytes, UA: NEGATIVE
Nitrite: NEGATIVE
Protein, ur: NEGATIVE mg/dL
Specific Gravity, Urine: 1.014 (ref 1.005–1.030)
Urobilinogen, UA: 1 mg/dL (ref 0.0–1.0)
pH: 6 (ref 5.0–8.0)

## 2014-07-03 LAB — GLUCOSE, CAPILLARY
GLUCOSE-CAPILLARY: 136 mg/dL — AB (ref 70–99)
GLUCOSE-CAPILLARY: 110 mg/dL — AB (ref 70–99)
GLUCOSE-CAPILLARY: 128 mg/dL — AB (ref 70–99)
Glucose-Capillary: 91 mg/dL (ref 70–99)

## 2014-07-03 MED ORDER — TAMSULOSIN HCL 0.4 MG PO CAPS
0.4000 mg | ORAL_CAPSULE | Freq: Every day | ORAL | Status: DC
  Administered 2014-07-03 – 2014-07-06 (×4): 0.4 mg via ORAL
  Filled 2014-07-03 (×4): qty 1

## 2014-07-03 NOTE — Progress Notes (Signed)
Pt ambulated with RW and 2L O2 approximately 500 ft with assist x1.  Pt took multiple breaks during walk but with no complaints of SOB or CP.  Pt back to bed with call bell in reach. SpO2 96% on 2L on return.  Will continue to monitor closely.

## 2014-07-03 NOTE — Progress Notes (Addendum)
[address]       [place], [address]             [phone]      5 Days Post-Op Procedure(s) (LRB): Coronary artery bypass grafting on pump using left internal mammary artery to left anterior descending artery, right greater saphenous vein to right coronary artery. ([name]) Aortic valve replacement using [name] Ease 9mm valve Serial number [id number] ([name]) INTRAOPERATIVE TRANSESOPHAGEAL ECHOCARDIOGRAM ([name]) Subjective: Feeling better, appetite improving , maintaining sinus rhythm  Objective: Vital signs in last 24 hours: Temp:  [98 F (36.7 C)-98.6 F (37 C)] 98 F (36.7 C) (08/12 0633) Pulse Rate:  [70-143] 70 (08/12 0633) Cardiac Rhythm:  [-] Normal sinus rhythm (08/11 2030) Resp:  [18-20] 18 (08/11 1949) BP: (133-155)/(51-84) 152/51 mmHg (08/12 0633) SpO2:  [96 %-97 %] 97 % (08/12 0633) Weight:  [187 lb 6.3 oz (85 kg)] 187 lb 6.3 oz (85 kg) (08/12 0500)  Hemodynamic parameters for last 24 hours:    Intake/Output from previous day: 08/11 0701 - 08/12 0700 In: 200 [P.O.:200] Out: 875 [Urine:875] Intake/Output this shift:    General appearance: alert, cooperative and no distress Heart: regular rate and rhythm and 2/6 syst murmur Lungs: dim in lower fields Abdomen: benign Extremities: minor LE edema Wound: incis healing well  Lab Results:  Recent Labs  07/01/14 0500 07/02/14 0340  WBC 10.1 8.5  HGB 8.9* 9.1*  HCT 27.2* 28.4*  PLT 104* 149*   BMET:  Recent Labs  07/01/14 0500 07/02/14 0340  NA 135* 136*  K 4.7 4.5  CL 101 99  CO2 25 25  GLUCOSE 117* 116*  BUN 20 21  CREATININE 1.02 1.06  CALCIUM 8.4 8.4    PT/INR: No results found for this basename: LABPROT, INR,  in the last 72 hours ABG    Component Value Date/Time   PHART 7.335* 06/29/2014 0005   HCO3 21.2 06/29/2014 0005   TCO2 19 06/29/2014 1533   ACIDBASEDEF 4.0* 06/29/2014 0005   O2SAT 93.0 06/29/2014 0005   CBG (last 3)   Recent Labs  07/02/14 1626  07/02/14 2200 07/03/14 0633  GLUCAP 112* 116* 91   Scheduled Meds: . amiodarone  400 mg Oral BID  . antiseptic oral rinse  7 mL Mouth Rinse BID  . aspirin EC  325 mg Oral Daily  . atorvastatin  80 mg Oral q1800  . docusate sodium  200 mg Oral Daily  . enoxaparin (LOVENOX) injection  40 mg Subcutaneous Q24H  . feeding supplement (ENSURE COMPLETE)  237 mL Oral BID BM  . furosemide  20 mg Oral Daily  . insulin aspart  0-24 Units Subcutaneous TID AC & HS  . lisinopril  2.5 mg Oral Daily  . metoprolol tartrate  25 mg Oral BID  . nystatin cream   Topical BID  . pantoprazole  40 mg Oral QAC breakfast  . sodium chloride  3 mL Intravenous Q12H   Continuous Infusions:  PRN Meds:.sodium chloride, bisacodyl, bisacodyl, guaiFENesin, metoprolol, ondansetron (ZOFRAN) IV, ondansetron, oxyCODONE, sodium chloride, traMADol  Dg Chest 2 View  07/02/2014   CLINICAL DATA:  Aortic valve replacement.  EXAM: CHEST  2 VIEW  COMPARISON:  07/01/2014.  FINDINGS: Support apparatus: Interval removal of RIGHT IJ vascular sheath. Monitoring leads project over the chest. Epicardial pacing leads project over the chest and abdomen.  Cardiomediastinal Silhouette: Within normal limits. Postoperative changes of median sternotomy, CABG and bioprosthetic aortic valve placement.  Lungs: Basilar  atelectasis. No edema or focal consolidation. No pneumothorax.  Effusions: Small bilateral posteriorly layering pleural effusions with associated atelectasis.  Other:  None.  IMPRESSION: 1. Interval removal of RIGHT IJ vascular sheath. 2. Improving aeration with mild basilar atelectasis and small posteriorly layering bilateral pleural effusions.   Electronically Signed   By: Dereck Ligas M.D.   On: 07/02/2014 08:06   Assessment/Plan: S/P Procedure(s) (LRB): Coronary artery bypass grafting on pump using left internal mammary artery to left anterior descending artery, right greater saphenous vein to right coronary artery. ([name]) Aortic  valve replacement using [facility] Ease 58mm valve Serial number [id number] ([name]) INTRAOPERATIVE TRANSESOPHAGEAL ECHOCARDIOGRAM ([name])  1 conts to progress 2 cont current afib management. Check tsh 3 voiding with catheter out 4 sugars ok 5 cont diuresis 6 recheck labs in am 7 pulm toilet.wean O2  8 rehab  LOS: 13 days    Carrie [name],Carrie Carrie [name] 07/03/2014  Trouble with urinary retention, ua urine culture sent, started on flomax

## 2014-07-03 NOTE — Progress Notes (Signed)
CARDIAC REHAB PHASE I   PRE:  Rate/Rhythm: 69 SR  BP:  Supine: 154/59  Sitting:   Standing:    SaO2: 95% 1L,88%RA ,92% 2L    MODE:  Ambulation: 720 ft   POST:  Rate/Rhythm: 101 ST  BP:  Supine:   Sitting: 154/63  Standing:    SaO2: 85% RA walking, 92% 2L  SATURATION QUALIFICATIONS: (This note is used to comply with regulatory documentation for home oxygen)  Patient Saturations on Room Air at Rest = 88%  Patient Saturations on Room Air while Ambulating = 85%  Patient Saturations on 2 Liters of oxygen while Ambulating = 92%  Please briefly explain why patient needs home oxygen: desat on RA when walking  0930-1020 Took 2L to keep sats up walking. Pt stopped many times to rest during walk of 720 ft. Tried to use BSC before and after walk with no success. Pt walked with rolling walker and minimal asst. Pt would like rolling walker and BSC for home use. Notified pt's RN. To recliner after walk. Left on 1L.   Graylon Good, RN BSN  07/03/2014 10:17 AM

## 2014-07-04 DIAGNOSIS — E039 Hypothyroidism, unspecified: Secondary | ICD-10-CM | POA: Diagnosis present

## 2014-07-04 LAB — GLUCOSE, CAPILLARY
GLUCOSE-CAPILLARY: 119 mg/dL — AB (ref 70–99)
Glucose-Capillary: 108 mg/dL — ABNORMAL HIGH (ref 70–99)
Glucose-Capillary: 119 mg/dL — ABNORMAL HIGH (ref 70–99)
Glucose-Capillary: 116 mg/dL — ABNORMAL HIGH (ref 70–99)

## 2014-07-04 LAB — CBC
HCT: 26 % — ABNORMAL LOW (ref 36.0–46.0)
HEMOGLOBIN: 8.3 g/dL — AB (ref 12.0–15.0)
MCH: 29.3 pg (ref 26.0–34.0)
MCHC: 31.9 g/dL (ref 30.0–36.0)
MCV: 91.9 fL (ref 78.0–100.0)
Platelets: 217 10*3/uL (ref 150–400)
RBC: 2.83 MIL/uL — ABNORMAL LOW (ref 3.87–5.11)
RDW: 14.5 % (ref 11.5–15.5)
WBC: 8.3 10*3/uL (ref 4.0–10.5)

## 2014-07-04 LAB — BASIC METABOLIC PANEL
ANION GAP: 14 (ref 5–15)
BUN: 27 mg/dL — AB (ref 6–23)
CALCIUM: 8.4 mg/dL (ref 8.4–10.5)
CHLORIDE: 94 meq/L — AB (ref 96–112)
CO2: 25 mEq/L (ref 19–32)
CREATININE: 1.12 mg/dL — AB (ref 0.50–1.10)
GFR calc Af Amer: 58 mL/min — ABNORMAL LOW (ref >90)
GFR calc non Af Amer: 50 mL/min — ABNORMAL LOW (ref >90)
Glucose, Bld: 119 mg/dL — ABNORMAL HIGH (ref 70–99)
Potassium: 4.8 mEq/L (ref 3.7–5.3)
Sodium: 133 mEq/L — ABNORMAL LOW (ref 137–147)

## 2014-07-04 LAB — TSH: TSH: 9.81 u[IU]/mL — AB (ref 0.350–4.500)

## 2014-07-04 MED ORDER — LEVOTHYROXINE SODIUM 50 MCG PO TABS
50.0000 ug | ORAL_TABLET | Freq: Every day | ORAL | Status: DC
  Administered 2014-07-04 – 2014-07-06 (×3): 50 ug via ORAL
  Filled 2014-07-04 (×5): qty 1

## 2014-07-04 NOTE — Progress Notes (Signed)
Report received from [facility], assessment unchanged, tele box transferred and monitoring notified, pt in bed Rickard Rhymes, RN

## 2014-07-04 NOTE — Progress Notes (Addendum)
Carrie [name] 411       Carrie [name]             Carrie [name]      6 Days Post-Op Procedure(s) (LRB): Coronary artery bypass grafting on pump using left internal mammary artery to left anterior descending artery, right greater saphenous vein to right coronary artery. ([name]) Aortic valve replacement using Carrie [name] valve Serial number Carrie [name] ([name]) Carrie [name] ([name]) Subjective: Feels a bit stronger  Objective: Vital signs in last 24 hours: Temp:  [98.3 F (36.8 C)-98.6 F (37 C)] 98.6 F (37 C) (08/13 0341) Pulse Rate:  [71-88] 71 (08/13 0341) Cardiac Rhythm:  [-] Normal sinus rhythm (08/12 2059) Resp:  [18-20] 18 (08/13 0341) BP: (117-156)/(46-57) 139/49 mmHg (08/13 0341) SpO2:  [92 %-96 %] 96 % (08/13 0341) Weight:  [189 lb 6.4 oz (85.911 kg)] 189 lb 6.4 oz (85.911 kg) (08/13 0341)  Hemodynamic parameters for last 24 hours:    Intake/Output from previous day: 08/12 0701 - 08/13 0700 In: 480 [P.O.:480] Out: 1325 [Urine:1325] Intake/Output this shift:    General appearance: alert, cooperative and no distress Heart: regular rate and rhythm Lungs: mildly dim in bases Abdomen: benign Extremities: mild edema, R>L LE Wound: incis healing well Heart cont: + 2/6 systolic murmur Lab Results:  Recent Labs  07/02/14 0340 07/04/14 0310  WBC 8.5 8.3  HGB 9.1* 8.3*  HCT 28.4* 26.0*  PLT 149* 217   BMET:  Recent Labs  07/02/14 0340 07/04/14 0310  NA 136* 133*  K 4.5 4.8  CL 99 94*  CO2 25 25  GLUCOSE 116* 119*  BUN 21 27*  CREATININE 1.06 1.12*  CALCIUM 8.4 8.4    PT/INR: No results found for this basename: LABPROT, INR,  in the last 72 hours ABG    Component Value Date/Time   PHART 7.335* 06/29/2014 0005   HCO3 21.2 06/29/2014 0005   TCO2 19 06/29/2014 1533   ACIDBASEDEF 4.0* 06/29/2014 0005   O2SAT 93.0 06/29/2014 0005   CBG (last 3)   Recent Labs  07/03/14 1617 07/03/14 2134  07/04/14 0634  GLUCAP 110* 128* 108*   Scheduled Meds: . amiodarone  400 mg Oral BID  . antiseptic oral rinse  7 mL Mouth Rinse BID  . aspirin EC  325 mg Oral Daily  . atorvastatin  80 mg Oral q1800  . docusate sodium  200 mg Oral Daily  . enoxaparin (LOVENOX) injection  40 mg Subcutaneous Q24H  . feeding supplement (ENSURE COMPLETE)  237 mL Oral BID BM  . insulin aspart  0-24 Units Subcutaneous TID AC & HS  . lisinopril  2.5 mg Oral Daily  . metoprolol tartrate  25 mg Oral BID  . nystatin cream   Topical BID  . pantoprazole  40 mg Oral QAC breakfast  . sodium chloride  3 mL Intravenous Q12H  . tamsulosin  0.4 mg Oral Daily   Continuous Infusions:  PRN Meds:.sodium chloride, bisacodyl, bisacodyl, guaiFENesin, metoprolol, ondansetron (ZOFRAN) IV, ondansetron, oxyCODONE, sodium chloride, traMADol No results found.  Assessment/Plan: S/P Procedure(s) (LRB): Coronary artery bypass grafting on pump using left internal mammary artery to left anterior descending artery, right greater saphenous vein to right coronary artery. ([name]) Aortic valve replacement using Carrie [name] valve Serial number Carrie [name] ([name]) Carrie [name] ([name])  1 Appears to be hypothyroid with elevated TSH, will add synthroid - will need f/u with primary as outpatient 2 maint SR  on current rx - will have to be careful with amio since hypothyroid 3 sugars controlled- not a diabetic by history 4 UO improving voiding- now on flomax 5 H/H pretty stable, sl lower 6 renal fxn ok 7 push rehab/pulm toilet  LOS: 14 days    Carrie [name] 07/04/2014  Holding sinus, voiding better Poss home sat I have seen and examined Carrie [name] and agree with the above assessment  and plan.  Carrie Isaac MD Beeper [phone] Office [phone] 07/04/2014 8:41 AM

## 2014-07-04 NOTE — Progress Notes (Signed)
CARDIAC REHAB PHASE I   PRE:  Rate/Rhythm: 79 SR    BP: sitting 149/53    SaO2: 98 2L  MODE:  Ambulation: 870 ft   POST:  Rate/Rhythm: 101 ST    BP: sitting 143/45     SaO2: 92 2L  Pt continues requiring O2, SOB walking and also c/o legs hurting from spinal stenosis. Numerous short standing rest stops. Able to talk while walking but breathless. SAO2 92 2L after walk. Increased distance. Encouraged x2 more walks today. [phone]  Carrie [name] CES, ACSM 07/04/2014 10:37 AM

## 2014-07-04 NOTE — Progress Notes (Signed)
Removed epicardial wires per order. 3 intact.  Pt tolerated procedure well.  Pt instructed to remain on bedrest for one hour.  Frequent vitals will be taken and documented. Pt resting with call bell within reach. Shellye Zandi McClintock, RN   

## 2014-07-05 LAB — URINE CULTURE: Colony Count: 60000

## 2014-07-05 LAB — GLUCOSE, CAPILLARY
GLUCOSE-CAPILLARY: 114 mg/dL — AB (ref 70–99)
GLUCOSE-CAPILLARY: 123 mg/dL — AB (ref 70–99)
Glucose-Capillary: 122 mg/dL — ABNORMAL HIGH (ref 70–99)

## 2014-07-05 MED ORDER — FUROSEMIDE 10 MG/ML IJ SOLN
40.0000 mg | Freq: Once | INTRAMUSCULAR | Status: AC
  Administered 2014-07-05: 40 mg via INTRAVENOUS
  Filled 2014-07-05: qty 4

## 2014-07-05 MED ORDER — LEVOFLOXACIN 750 MG PO TABS
750.0000 mg | ORAL_TABLET | Freq: Every day | ORAL | Status: DC
  Administered 2014-07-05 – 2014-07-06 (×2): 750 mg via ORAL
  Filled 2014-07-05 (×2): qty 1

## 2014-07-05 MED ORDER — MAGNESIUM HYDROXIDE 400 MG/5ML PO SUSP
15.0000 mL | Freq: Every day | ORAL | Status: DC | PRN
  Administered 2014-07-05: 15 mL via ORAL
  Filled 2014-07-05: qty 30

## 2014-07-05 NOTE — Progress Notes (Signed)
SATURATION QUALIFICATIONS: (This note is used to comply with regulatory documentation for home oxygen)  Patient Saturations on Room Air at Rest = 91 %  Patient Saturations on Room Air while Ambulating = 87%  Patient Saturations on 2 Liters of oxygen while Ambulating = 95%  Please briefly explain why patient needs home oxygen: Pt desats on room air to 87% while walking.

## 2014-07-05 NOTE — Progress Notes (Addendum)
      [address]       [place] [address]             [phone]      7 Days Post-Op Procedure(s) (LRB): Coronary artery bypass grafting on pump using left internal mammary artery to left anterior descending artery, right greater saphenous vein to right coronary artery. ([name]) Aortic valve replacement using [facility] Ease 3mm valve Serial number [id number] ([name]) INTRAOPERATIVE TRANSESOPHAGEAL ECHOCARDIOGRAM ([name])  Subjective:  Ms. Alkema continues to feel better.  She does continue to have some incisional discomfort.  + ambulation  + BM  Objective: Vital signs in last 24 hours: Temp:  [98 F (36.7 C)-98.7 F (37.1 C)] 98.7 F (37.1 C) (08/14 0426) Pulse Rate:  [70-83] 70 (08/14 0426) Cardiac Rhythm:  [-] Normal sinus rhythm (08/14 0734) Resp:  [18] 18 (08/14 0426) BP: (115-150)/(40-56) 124/46 mmHg (08/14 0426) SpO2:  [93 %-97 %] 95 % (08/14 0426) Weight:  [190 lb 1.6 oz (86.229 kg)] 190 lb 1.6 oz (86.229 kg) (08/14 0426)  Intake/Output from previous day: 08/13 0701 - 08/14 0700 In: 240 [P.O.:240] Out: 250 [Urine:250]  General appearance: alert, cooperative and no distress Heart: regular rate and rhythm Lungs: clear to auscultation bilaterally Abdomen: soft, non-tender; bowel sounds normal; no masses,  no organomegaly Extremities: edema 2+, R>L Wound: clean and dry, RLE ecchymotic, tender to touch  Lab Results:  Recent Labs  07/04/14 0310  WBC 8.3  HGB 8.3*  HCT 26.0*  PLT 217   BMET:  Recent Labs  07/04/14 0310  NA 133*  K 4.8  CL 94*  CO2 25  GLUCOSE 119*  BUN 27*  CREATININE 1.12*  CALCIUM 8.4    PT/INR: No results found for this basename: LABPROT, INR,  in the last 72 hours ABG    Component Value Date/Time   PHART 7.335* 06/29/2014 0005   HCO3 21.2 06/29/2014 0005   TCO2 19 06/29/2014 1533   ACIDBASEDEF 4.0* 06/29/2014 0005   O2SAT 93.0 06/29/2014 0005   CBG (last 3)   Recent Labs  07/04/14 1648 07/04/14 2131 07/05/14 0644    GLUCAP 119* 116* 114*    Assessment/Plan: S/P Procedure(s) (LRB): Coronary artery bypass grafting on pump using left internal mammary artery to left anterior descending artery, right greater saphenous vein to right coronary artery. ([name]) Aortic valve replacement using [facility] Ease 85mm valve Serial number [id number] ([name]) INTRAOPERATIVE TRANSESOPHAGEAL ECHOCARDIOGRAM ([name])  1. CV- NSR, continue Amiodarone and Lopressor 2. Pulm- off oxygen, no acute issues continue IS 3. Renal- remains hypervolemic, weight is up 16 lbs, will give dose of IV Lasix today 4. GU- urinary retention improved, Urine dipstick negative, Urine Culture > 60,000 GNR, species pending 5. Hypothryoidism-  New diagnosis, on Synthroid, will require outpatient follow up 7. Dispo- patient stable, + Urine Culture awaiting final results and will start appropriate ABX, likely d/c in AM   LOS: 15 days    BARRETT, ERIN 07/05/2014  Improving, poss home tomorrow   I have seen and examined Jonelle Sidle and agree with the above assessment  and plan.  Grace Isaac MD Beeper [phone] Office [phone] 07/05/2014 4:27 PM

## 2014-07-05 NOTE — Discharge Instructions (Signed)
MOUTH CARE AFTER SURGERY ° °FACTS: °· Ice used in ice bag helps keep the swelling down, and can help lessen the pain. °· It is easier to treat pain BEFORE it happens. °· Spitting disturbs the clot and may cause bleeding to start again, or to get worse. °· Smoking delays healing and can cause complications. °· Sharing prescriptions can be dangerous.  Do not take medications not recently prescribed for you. °· Antibiotics may stop birth control pills from working.  Use other means of birth control while on antibiotics. °· Warm salt water rinses after the first 24 hours will help lessen the swelling:  Use 1/2 teaspoonful of table salt per oz.of water. ° °DO NOT: °· Do not spit.  Do not drink through a straw. °· Strongly advised not to smoke, dip snuff or chew tobacco at least for 3 days. °· Do not eat sharp or crunchy foods.  Avoid the area of surgery when chewing. °· Do not stop your antibiotics before your instructions say to do so. °· Do not eat hot foods until bleeding has stopped.  If you need to, let your food cool down to room temperature. ° °EXPECT: °· Some swelling, especially first 2-3 days. °· Soreness or discomfort in varying degrees.  Follow your dentist's instructions about how to handle pain before it starts. °· Pinkish saliva or light blood in saliva, or on your pillow in the morning.  This can last around 24 hours. °· Bruising inside or outside the mouth.  This may not show up until 2-3 days after surgery.  Don't worry, it will go away in time. °· Pieces of "bone" may work themselves loose.  It's OK.  If they bother you, let us know. ° °WHAT TO DO IMMEDIATELY AFTER SURGERY: °· Bite on the gauze with steady pressure for 1-2 hours.  Don't chew on the gauze. °· Do not lie down flat.  Raise your head support especially for the first 24 hours. °· Apply ice to your face on the side of the surgery.  You may apply it 20 minutes on and a few minutes off.  Ice for 8-12 hours.  You may use ice up to 24  hours. °· Before the numbness wears off, take a pain pill as instructed. °· Prescription pain medication is not always required. ° °SWELLING: °· Expect swelling for the first couple of days.  It should get better after that. °· If swelling increases 3 days or so after surgery; let us know as soon as possible. ° °FEVER: °· Take Tylenol every 4 hours if needed to lower your temperature, especially if it is at 100F or higher. °· Drink lots of fluids. °· If the fever does not go away, let us know. ° °BREATHING TROUBLE: °· Any unusual difficulty breathing means you have to have someone bring you to the emergency room ASAP ° °BLEEDING: °· Light oozing is expected for 24 hours or so. °· Prop head up with pillows °· Avoid spitting °· Do not confuse bright red fresh flowing blood with lots of saliva colored with a little bit of blood. °· If you notice some bleeding, place gauze or a tea bag where it is bleeding and apply CONSTANT pressure by biting down for 1 hour.  Avoid talking during this time.  Do not remove the gauze or tea bag during this hour to "check" the bleeding. °· If you notice bright RED bleeding FLOWING out of particular area, and filling the floor of your mouth, put   a wad of gauze on that area, bite down firmly and constantly.  Call us immediately.  If we're closed, have someone bring you to the emergency room.  ORAL HYGIENE:  Brush your teeth as usual after meals and before bedtime.  Use a soft toothbrush around the area of surgery.  DO NOT AVOID BRUSHING.  Otherwise bacteria(germs) will grow and may delay healing or encourage infection.  Since you cannot spit, just gently rinse and let the water flow out of your mouth.  DO NOT SWISH HARD.  EATING:  Cool liquids are a good point to start.  Increase to soft foods as tolerated.  PRESCRIPTIONS:  Follow the directions for your prescriptions exactly as written.  If Dr. Enrique Sack gave you a narcotic pain medication, do not drive, operate  machinery or drink alcohol when on that medication.  QUESTIONS:  Call our office during office hours [phone] or call the Emergency Room at [phone].  Aortic Valve Replacement, Care After Refer to this sheet in the next few weeks. These instructions provide you with information on caring for yourself after your procedure. Your health care provider may also give you specific instructions. Your treatment has been planned according to current medical practices, but problems sometimes occur. Call your health care provider if you have any problems or questions after your procedure. HOME CARE INSTRUCTIONS   Take medicines only as directed by your health care provider.  If your health care provider has prescribed elastic stockings, wear them as directed.  Take frequent naps or rest often throughout the day.  Avoid lifting over 10 lbs (4.5 kg) or pushing or pulling things with your arms for 6-8 weeks or as directed by your health care provider.  Avoid driving or airplane travel for 4-6 weeks after surgery or as directed by your health care provider. If you are riding in a car for an extended period, stop every 1-2 hours to stretch your legs. Keep a record of your medicines and medical history with you when traveling.  Do not drive or operate heavy machinery while taking pain medicine. (narcotics).  Do not cross your legs.  Do not use any tobacco products including cigarettes, chewing tobacco, or electronic cigarettes. If you need help quitting, ask your health care provider.  Do not take baths, swim, or use a hot tub until your health care provider approves. Take showers once your health care provider approves. Pat incisions dry. Do not rub incisions with a washcloth or towel.  Avoid climbing stairs and using the handrail to pull yourself up for the first 2-3 weeks after surgery.  Return to work as directed by your health care provider.  Drink enough fluid to keep your urine clear or  pale yellow.  Do not strain to have a bowel movement. Eat high-fiber foods if you become constipated. You may also take a medicine to help you have a bowel movement (laxative) as directed by your health care provider.  Resume sexual activity as directed by your health care provider. Men should not use medicines for erectile dysfunction until their doctor says it isokay.  If you had a certain type of heart condition in the past, you may need to take antibiotic medicine before having dental work or surgery. Let your dentist and health care providers know if you had one or more of the following:  Previous endocarditis.  An artificial (prosthetic) heart valve.  Congenital heart disease. SEEK MEDICAL CARE IF:  You develop a skin rash.   You experience  sudden changes in your weight.  You have a fever. SEEK IMMEDIATE MEDICAL CARE IF:   You develop chest pain that is not coming from your incision.  You have drainage (pus), redness, swelling, or pain at your incision site.   You develop shortness of breath or have difficulty breathing.   You have increased bleeding from your incision site.   You develop light-headedness.  MAKE SURE YOU:   Understand these directions.  Will watch your condition.  Will get help right away if you are not doing well or get worse. Document Released: 05/27/2005 Document Revised: 03/25/2014 Document Reviewed: 08/22/2012 [facility] Patient Information 2015 [place], [place]. This information is not intended to replace advice given to you by your health care provider. Make sure you discuss any questions you have with your health care provider.  Endoscopic Saphenous Vein Harvesting Care After Refer to this sheet in the next few weeks. These instructions provide you with information on caring for yourself after your procedure. Your health care provider may also give you more specific instructions. Your treatment has been planned according to current medical  practices, but problems sometimes occur. Call your health care provider if you have any problems or questions after your procedure. HOME CARE INSTRUCTIONS Medicine  Take whatever pain medicine your surgeon prescribes. Follow the directions carefully. Do not take over-the-counter pain medicine unless your surgeon says it is okay. Some pain medicine can cause bleeding problems for several weeks after surgery.  Follow your surgeon's instructions about driving. You will probably not be permitted to drive after heart surgery.  Take any medicines your surgeon prescribes. Any medicines you took before your heart surgery should be checked with your health care provider before you start taking them again. Wound care  If your surgeon has prescribed an elastic bandage or stocking, ask how long you should wear it.  Check the area around your surgical cuts (incisions) whenever your bandages (dressings) are changed. Look for any redness or swelling.  You will need to return to have the stitches (sutures) or staples taken out. Ask your surgeon when to do that.  Ask your surgeon when you can shower or bathe. Activity  Try to keep your legs raised when you are sitting.  Do any exercises your health care providers have given you. These may include deep breathing exercises, coughing, walking, or other exercises. SEEK MEDICAL CARE IF:  You have any questions about your medicines.  You have more leg pain, especially if your pain medicine stops working.  New or growing bruises develop on your leg.  Your leg swells, feels tight, or becomes red.  You have numbness in your leg. SEEK IMMEDIATE MEDICAL CARE IF:  Your pain gets much worse.  Blood or fluid leaks from any of the incisions.  Your incisions become warm, swollen, or red.  You have chest pain.  You have trouble breathing.  You have a fever.  You have more pain near your leg incision. MAKE SURE YOU:  Understand these  instructions.  Will watch your condition.  Will get help right away if you are not doing well or get worse. Document Released: 07/21/2011 Document Revised: 11/13/2013 Document Reviewed: 07/21/2011 [facility] Patient Information 2015 [place], [place]. This information is not intended to replace advice given to you by your health care provider. Make sure you discuss any questions you have with your health care provider.

## 2014-07-05 NOTE — Discharge Summary (Signed)
Physician Discharge Summary  Patient ID: Carrie [name] MRN: [id number] DOB/AGE: 1951/07/09 63 y.o.  Admit date: 06/20/2014 Discharge date: 07/06/2014  Admission Diagnoses:  Patient Active Problem List   Diagnosis Date Noted  . CAD (coronary artery disease) 06/24/2014  . Aortic stenosis 06/24/2014  . Unstable angina 06/16/2014  . Hypertensive cardiovascular disease 06/16/2014  . Tobacco abuse 06/16/2014  . Bursitis of left shoulder 07/28/2011  . CANDIDIASIS, SKIN 06/18/2010  . PITYRIASIS ROSEA 02/12/2010  . OSTEOARTHRITIS, HIP 02/12/2010  . DERMATOMYCOSIS 09/03/2009  . HYPERCHOLESTEROLEMIA 07/14/2009  . Tobacco use disorder 07/14/2009  . BRADYCARDIA 07/11/2009  . PALPITATIONS 07/11/2009  . SYSTOLIC MURMUR 53/66/4403  . CAROTID BRUIT 07/11/2009  . NECK PAIN 07/10/2009  . HYPERTENSION 02/20/2009  . ARTHRITIS, LEFT SACROILIAC JOINT 02/20/2009  . ARTHRITIS, BACK 02/20/2009  . SCIATICA, ACUTE 02/20/2009   Discharge Diagnoses:   Patient Active Problem List   Diagnosis Date Noted  . Hypothyroid 07/04/2014  . 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 S/P AVR (aortic valve replacement) 06/28/2014  . CAD (coronary artery disease) 06/24/2014  . Aortic stenosis 06/24/2014  . Unstable angina 06/16/2014  . Hypertensive cardiovascular disease 06/16/2014  . Tobacco abuse 06/16/2014  . Bursitis of left shoulder 07/28/2011  . CANDIDIASIS, SKIN 06/18/2010  . PITYRIASIS ROSEA 02/12/2010  . OSTEOARTHRITIS, HIP 02/12/2010  . DERMATOMYCOSIS 09/03/2009  . HYPERCHOLESTEROLEMIA 07/14/2009  . Tobacco use disorder 07/14/2009  . BRADYCARDIA 07/11/2009  . PALPITATIONS 07/11/2009  . SYSTOLIC MURMUR 47/42/5956  . CAROTID BRUIT 07/11/2009  . NECK PAIN 07/10/2009  . HYPERTENSION 02/20/2009  . ARTHRITIS, LEFT SACROILIAC JOINT 02/20/2009  . ARTHRITIS, BACK 02/20/2009  . SCIATICA, ACUTE 02/20/2009   Discharged Condition: good  History of Present Illness:   Carrie [name] is a 63 yo white female with known  history of Hyperlipidemia, Hypertension, and smoking abuse.She presented to [facility] on 06/16/2014 with complaints of chest pain.  These episodes occur when resting at night and with exertion.  The pain radiates into her neck and is associated with shortness of breath.  It was felt she should undergo cardiac catheterization.  This was performed on 06/20/2014 which showed severe 2 vessel CAD, severe Aortic Stenosis, and normal LV function.  It was felt she should undergo surgical intervention, the patient was started on heparin and admitted to the hospital for further care.    Hospital Course:   Patient remained chest pain free during admission.  TCTS consult was obtained and they were in agreement the patient should undergo Aortic Valve replacement and Coronary Bypass procedure. However, prior to proceeding with surgery she would require dental evaluation.  She was evaluated by Carrie [name] who felt she would require multiple extractions with alveoloplasty prior to proceeding with her Aortic Valve procedure.  She was agreeable to this and surgery was performed on 06/25/2014.  The patient remained stable post procedure.  She continued to be chest pain free.  Once it was felt safe the patient could have anticoagulation from a dental standpoint the patient was taken to the operating room.  This was done on 06/28/2014 at which time she underwent CABG x 2 utilizing LIMA to LAD and SVG to RCA.  She also underwent Aortic Valve Replacement.  Finally she underwent open saphenous vein harvest from her right lower leg.  She tolerated the procedure with minimal difficulty and was taken to the SICU in stable condition.  She was extubated the evening of surgery.  During her stay in the SICU  the patient was found to be thrombocytopenic and was kept off Heparin agents.  She was weaned off Neo Synephrine as tolerated.  Her chest tubes and arterial lines were removed without difficulty.  She was maintaining  NSR and ambulating in the unit with minimal difficulty.  She was felt medically stable for transfer to step down unit.    The patient developed urinary retention.  Foley catheter was replaced, urinalysis and culture were obtained.  Urinalysis was negative, however culture is showing gram negative rods which was confirmed Carrie [name].  She will be started on Levaquin per sensitivities for 7 days.  She was also started on Flomax.  Foley catheter has since been removed and the patient is not voiding with minimal difficulty.  The patient developed Rapid Atrial Fibrillation.  She was placed on oral Amiodarone and given an IV Bolus.  She converted to NSR, but later converted back into Atrial Fibrillation.  She was again treated with Amiodarone with conversion to NSR.  A TSH level was obtained and was found to be elevated at 9.810.  Therefore, she was started on Synthroid with instructions to follow up with her PCP.    Her pacing wires were removed without difficulty.  The patient continues to progress.  She is ambulating with assistance of a rolling walker.  She will require home oxygen which has been arranged.  She is tolerating a heart healthy diet.  Should no issues arise we anticipate discharge home 07/06/2014.         Significant Diagnostic Studies: angiography:   Hemodynamics  RA 14/10 mean 8 mm Hg  RV 35/6/10 mm Hg  PA 31/12 mean 20 mm Hg  PCWP 17/14 mean 13 mm Hg  LV 148/65 mean 95 mm Hg  AO 216/17 mm Hg  AV mean gradient 45 mmHg  AV area 0.7 cm squared, index 0.37  Oxygen saturations:  PA 58%  AO 93%  Cardiac Output (Fick) 4.02 L/min  Cardiac Index (Fick) 2.112 L/min/meter squared  Coronary angiography:  Coronary dominance: right  Left mainstem: 40% ostial/proximal  Left anterior descending (LAD): 60% mid LAD stenosis.  Left circumflex (LCx): 40% mid LCx post OM1.  Right coronary artery (RCA): 95% ostial lesion followed by 95% lesion in the proximal to mid RCA.  Left  ventriculography: Left ventricular systolic function is normal, LVEF is estimated at 70%, there is no significant mitral regurgitation   Treatments: surgery:   1. Multiple extraction of tooth numbers 2, 3, 4, 14, 18, 19  2. 3 Quadrants of alveoloplasty  3. Gross debridement of remaining dentition  Coronary artery bypass grafting x2 with left internal mammary to the left anterior descending coronary artery and  reverse saphenous vein graft to the distal right coronary artery and aortic valve replacement with pericardial tissue valve, model [id number] [employer] 19 mm serial number [id number], attempted endo vein  harvesting, and vein harvesting, right lower extremity.  Disposition: 01-Home or Self Care  Discharge Medications:     Medication List    STOP taking these medications       nitroGLYCERIN 0.4 MG SL tablet  Commonly known as:  NITROSTAT     triamterene-hydrochlorothiazide 75-50 MG per tablet  Commonly known as:  MAXZIDE      TAKE these medications       amiodarone 200 MG tablet  Commonly known as:  PACERONE  Take 1 tablet (200 mg total) by mouth 2 (two) times daily. For 7 Days, then decrease to 200 mg daily  aspirin 325 MG EC tablet  Take 1 tablet (325 mg total) by mouth daily.     atorvastatin 80 MG tablet  Commonly known as:  LIPITOR  Take 1 tablet (80 mg total) by mouth daily at 6 PM.     furosemide 40 MG tablet  Commonly known as:  LASIX  Take 1 tablet (40 mg total) by mouth daily. For 7 Days     levofloxacin 750 MG tablet  Commonly known as:  LEVAQUIN  Take 1 tablet (750 mg total) by mouth daily. For 9 more Days     levothyroxine 50 MCG tablet  Commonly known as:  SYNTHROID, LEVOTHROID  Take 1 tablet (50 mcg total) by mouth daily before breakfast.     lisinopril 2.5 MG tablet  Commonly known as:  PRINIVIL,ZESTRIL  Take 1 tablet (2.5 mg total) by mouth daily.     metoprolol tartrate 25 MG tablet  Commonly known as:  LOPRESSOR  Take 1 tablet  (25 mg total) by mouth 2 (two) times daily.     oxyCODONE 5 MG immediate release tablet  Commonly known as:  Oxy IR/ROXICODONE  Take 1-2 tablets (5-10 mg total) by mouth every 3 (three) hours as needed for severe pain.     Potassium Chloride ER 20 MEQ Tbcr  Take 20 mEq by mouth daily. For 7 Days         The patient has been discharged on:   1.Beta Blocker:  Yes [ x  ]                              No   [   ]                              If No, reason:  2.Ace Inhibitor/ARB: Yes [ x  ]                                     No  [    ]                                     If No, reason:  3.Statin:   Yes [ x  ]                  No  [   ]                  If No, reason:  4.Ecasa:  Yes  [ x  ]                  No   [   ]                  If No, reason:    Follow-up Information   Follow up with Lenn Cal, [place]. Call in 1 day. (For wound re-check and earlier if needed.)    Specialty:  Dentistry   Contact information:   [place] [address] [phone]       Follow up with [facility]. (Nurse and physical therapist to follow up with you at home.  )    Contact information:   [place] [address] [phone]       Follow up with Lenn Cal, DDS. (For evaluation of healing)    Specialty:  Dentistry   Contact information:   [place] [address] [phone]       Follow up with Grace Isaac, MD On 08/08/2014. (Appointment is at 10:30)    Specialty:  Cardiothoracic Surgery   Contact information:   [place] [address] [phone]       Follow up with [facility] On 08/08/2014. (Appointment is at 9:30, located on the first floor of [facility])    Contact information:   [place]       Follow up with Richardson Dopp, PA-C On 07/25/2014. (Appointment is at 11:30)    Specialty:  Physician Assistant   Contact information:   [place].  [address] [place] [address] [phone]       Signed: Ellwood Handler 07/06/2014, 8:17 AM

## 2014-07-05 NOTE — Progress Notes (Signed)
CARDIAC REHAB PHASE I   PRE:  Rate/Rhythm: 67 SR  BP:  Supine: 119/42  Sitting:   Standing:    SaO2: 97 2L 91 RA  MODE:  Ambulation: 740 ft   POST:  Rate/Rhythm: 81  BP:  Supine:   Sitting: 120/50  Standing:    SaO2: 87 RA in hall 95 2L afte walk 1345-1445 On arrival pt in bed on O2 2L sat 97%. O2 discontinued room air sat 91% with pt lying in bed. Assisted X 1 and used walker to ambulate. Gait steady with walker. She was able to walk 740 feet with several rest stops due to back and leg pain. Room air sat 87% during walk, O2 applied 2L sat improved to 95%. Walked pt rest of walk on O2 2L. Pt back to bed after walk with call light in reach. Completed discharge education with pt She voices understanding. She agrees to [facility] in [place], will send referral. We discussed smoking cessation. I gave pt tips for quitting, quit smart class information and coaching contact number. Pt states that she has quit smoking and plan to never smoke again.  Rodney Langton RN 07/05/2014 2:37 PM

## 2014-07-06 MED ORDER — POTASSIUM CHLORIDE ER 20 MEQ PO TBCR: 20.0000 meq | EXTENDED_RELEASE_TABLET | Freq: Every day | ORAL | Status: DC

## 2014-07-06 MED ORDER — METOPROLOL TARTRATE 25 MG PO TABS: 25.0000 mg | ORAL_TABLET | Freq: Two times a day (BID) | ORAL | Status: DC

## 2014-07-06 MED ORDER — FUROSEMIDE 40 MG PO TABS: 40.0000 mg | ORAL_TABLET | Freq: Every day | ORAL | Status: DC

## 2014-07-06 MED ORDER — LEVOFLOXACIN 750 MG PO TABS: 750.0000 mg | ORAL_TABLET | Freq: Every day | ORAL | Status: DC

## 2014-07-06 MED ORDER — LISINOPRIL 2.5 MG PO TABS: 2.5000 mg | ORAL_TABLET | Freq: Every day | ORAL | Status: DC

## 2014-07-06 MED ORDER — LEVOTHYROXINE SODIUM 50 MCG PO TABS: 50.0000 ug | ORAL_TABLET | Freq: Every day | ORAL | Status: AC

## 2014-07-06 MED ORDER — ASPIRIN 325 MG PO TBEC: 325.0000 mg | DELAYED_RELEASE_TABLET | Freq: Every day | ORAL | Status: DC

## 2014-07-06 MED ORDER — FUROSEMIDE 10 MG/ML IJ SOLN
40.0000 mg | Freq: Once | INTRAMUSCULAR | Status: AC
  Administered 2014-07-06: 40 mg via INTRAVENOUS
  Filled 2014-07-06: qty 4

## 2014-07-06 MED ORDER — AMIODARONE HCL 200 MG PO TABS: 200.0000 mg | ORAL_TABLET | Freq: Two times a day (BID) | ORAL | Status: DC

## 2014-07-06 MED ORDER — ATORVASTATIN CALCIUM 80 MG PO TABS: 80.0000 mg | ORAL_TABLET | Freq: Every day | ORAL | Status: DC

## 2014-07-06 MED ORDER — OXYCODONE HCL 5 MG PO TABS: 5.0000 mg | ORAL_TABLET | ORAL | Status: DC | PRN

## 2014-07-06 NOTE — Progress Notes (Signed)
SATURATION QUALIFICATIONS: (This note is used to comply with regulatory documentation for home oxygen)  Patient Saturations on Room Air at Rest = 96%  Patient Saturations on Room Air while Ambulating = 96-99%  Patient Saturations on [name] Liters of oxygen while Ambulating = [name]  Please briefly explain why patient needs home oxygen: PA notified; pt does not qualify for home oxygen

## 2014-07-06 NOTE — Progress Notes (Signed)
Patient for d/c today.  Answered questions regarding showering.  Reinforced IS, DB, and purse lip breathing.  Demonstrated how saturations increase with PLB.  No further questions.   [phone] Rosebud Poles RN

## 2014-07-06 NOTE — Progress Notes (Signed)
Pt ambulated in hallway 250 ft with front wheel rolling walker; pt walked without oxygen; pt tolerated walk well; pt is in bed with call bell within reach; will continue to monitor.  Rowe Pavy, RN

## 2014-07-06 NOTE — Progress Notes (Signed)
DC chest tube sutures per MD orders and protocol.  Rowe Pavy, RN

## 2014-07-06 NOTE — Progress Notes (Addendum)
      [address]       [place] [address]             [phone]      8 Days Post-Op Procedure(s) (LRB): Coronary artery bypass grafting on pump using left internal mammary artery to left anterior descending artery, right greater saphenous vein to right coronary artery. ([name]) Aortic valve replacement using [facility] Ease 13mm valve Serial number [id number] ([name]) INTRAOPERATIVE TRANSESOPHAGEAL ECHOCARDIOGRAM ([name])  Subjective:  Ms. Malphrus has no new complaints this morning.  She states she has some mild nausea and continued incisional discomfort.  She states she is ready for discharge home  + ambulation + BM  Objective: Vital signs in last 24 hours: Temp:  [98.2 F (36.8 C)-98.6 F (37 C)] 98.2 F (36.8 C) (08/15 0419) Pulse Rate:  [66-80] 79 (08/15 0419) Cardiac Rhythm:  [-] Normal sinus rhythm (08/14 2040) Resp:  [18] 18 (08/15 0419) BP: (119-141)/(42-54) 141/54 mmHg (08/15 0419) SpO2:  [93 %-96 %] 96 % (08/15 0419) Weight:  [189 lb 6 oz (85.9 kg)] 189 lb 6 oz (85.9 kg) (08/15 0419)  Intake/Output from previous day: 08/14 0701 - 08/15 0700 In: 240 [P.O.:240] Out: -   General appearance: alert, cooperative and no distress Heart: regular rate and rhythm Lungs: diminished breath sounds bibasilar Abdomen: soft, non-tender; bowel sounds normal; no masses,  no organomegaly Extremities: edema trace, ecchymosis RLE, tender to touch Wound: clean and dry  Lab Results:  Recent Labs  07/04/14 0310  WBC 8.3  HGB 8.3*  HCT 26.0*  PLT 217   BMET:  Recent Labs  07/04/14 0310  NA 133*  K 4.8  CL 94*  CO2 25  GLUCOSE 119*  BUN 27*  CREATININE 1.12*  CALCIUM 8.4    PT/INR: No results found for this basename: LABPROT, INR,  in the last 72 hours ABG    Component Value Date/Time   PHART 7.335* 06/29/2014 0005   HCO3 21.2 06/29/2014 0005   TCO2 19 06/29/2014 1533   ACIDBASEDEF 4.0* 06/29/2014 0005   O2SAT 93.0 06/29/2014 0005   CBG (last 3)   Recent  Labs  07/05/14 0644 07/05/14 1116 07/05/14 1621  GLUCAP 114* 123* 122*    Assessment/Plan: S/P Procedure(s) (LRB): Coronary artery bypass grafting on pump using left internal mammary artery to left anterior descending artery, right greater saphenous vein to right coronary artery. ([name]) Aortic valve replacement using [facility] Ease 14mm valve Serial number [id number] ([name]) INTRAOPERATIVE TRANSESOPHAGEAL ECHOCARDIOGRAM ([name])  1. CV- NSR- continue Amiodarone, Lopressor 2. Pulm- off oxygen, continue IS 3. Renal- remains hypervolemic, will repeat IV lasix prior to discharge, resume PO tomorrow 4. GU- urinary retention improved, continue Flomax patient voiding without difficulty 5. Hypothyroidism- continue Synthroid 6. ID- + Urine Culture, Citrobacter Freundii- continue Levaquin 7. Dispo- patient stable, will plan to d/c home today    LOS: 16 days    Luisana Lutzke 07/06/2014

## 2014-07-06 NOTE — Progress Notes (Signed)
D/C IV and Tele per MD orders and protocol. D/C instructions reviewed with pt. And husband at bedside, no further questions from pt. Or family. Paper prescriptions given to pt and husband.  Wardell Honour, RN

## 2014-07-08 ENCOUNTER — Telehealth: Payer: Self-pay | Admitting: *Deleted

## 2014-07-08 ENCOUNTER — Other Ambulatory Visit: Payer: Self-pay | Admitting: *Deleted

## 2014-07-08 DIAGNOSIS — N390 Urinary tract infection, site not specified: Secondary | ICD-10-CM

## 2014-07-08 MED ORDER — SULFAMETHOXAZOLE-TMP DS 800-160 MG PO TABS: 1.0000 | ORAL_TABLET | Freq: Two times a day (BID) | ORAL | Status: DC

## 2014-07-08 NOTE — Telephone Encounter (Signed)
Due to the contraindication of taking amiodarone with Levaquin, the antibiotic was changed to Bactrim to treat her UTI and Carrie [name] was notified.  She acknowledged understanding.

## 2014-07-11 ENCOUNTER — Ambulatory Visit (INDEPENDENT_AMBULATORY_CARE_PROVIDER_SITE_OTHER): Payer: Medicare Other | Admitting: Family Medicine

## 2014-07-11 ENCOUNTER — Encounter: Payer: Self-pay | Admitting: Family Medicine

## 2014-07-11 VITALS — BP 155/73 | HR 68 | Temp 98.7°F | Resp 20 | Ht 64.0 in | Wt 179.0 lb

## 2014-07-11 DIAGNOSIS — R600 Localized edema: Secondary | ICD-10-CM

## 2014-07-11 DIAGNOSIS — D649 Anemia, unspecified: Secondary | ICD-10-CM

## 2014-07-11 DIAGNOSIS — E038 Other specified hypothyroidism: Secondary | ICD-10-CM

## 2014-07-11 DIAGNOSIS — I2510 Atherosclerotic heart disease of native coronary artery without angina pectoris: Secondary | ICD-10-CM

## 2014-07-11 DIAGNOSIS — I209 Angina pectoris, unspecified: Secondary | ICD-10-CM

## 2014-07-11 DIAGNOSIS — Z951 Presence of aortocoronary bypass graft: Secondary | ICD-10-CM

## 2014-07-11 DIAGNOSIS — I25118 Atherosclerotic heart disease of native coronary artery with other forms of angina pectoris: Secondary | ICD-10-CM

## 2014-07-11 DIAGNOSIS — Z954 Presence of other heart-valve replacement: Secondary | ICD-10-CM

## 2014-07-11 DIAGNOSIS — Z23 Encounter for immunization: Secondary | ICD-10-CM

## 2014-07-11 DIAGNOSIS — Z952 Presence of prosthetic heart valve: Secondary | ICD-10-CM

## 2014-07-11 DIAGNOSIS — R609 Edema, unspecified: Secondary | ICD-10-CM

## 2014-07-11 DIAGNOSIS — I10 Essential (primary) hypertension: Secondary | ICD-10-CM

## 2014-07-11 DIAGNOSIS — I200 Unstable angina: Secondary | ICD-10-CM

## 2014-07-11 DIAGNOSIS — Z0000 Encounter for general adult medical examination without abnormal findings: Secondary | ICD-10-CM

## 2014-07-11 LAB — BASIC METABOLIC PANEL
BUN: 24 mg/dL — ABNORMAL HIGH (ref 6–23)
CALCIUM: 8.8 mg/dL (ref 8.4–10.5)
CO2: 29 mEq/L (ref 19–32)
Chloride: 96 mEq/L (ref 96–112)
Creatinine, Ser: 1.6 mg/dL — ABNORMAL HIGH (ref 0.4–1.2)
GFR: 33.38 mL/min — AB (ref >60.00)
Glucose, Bld: 101 mg/dL — ABNORMAL HIGH (ref 70–99)
Potassium: 4.8 mEq/L (ref 3.5–5.1)
SODIUM: 133 meq/L — AB (ref 135–145)

## 2014-07-11 LAB — CBC WITH DIFFERENTIAL/PLATELET
Basophils Absolute: 0.1 10*3/uL (ref 0.0–0.1)
Basophils Relative: 0.5 % (ref 0.0–3.0)
Eosinophils Absolute: 0.2 10*3/uL (ref 0.0–0.7)
Eosinophils Relative: 1.3 % (ref 0.0–5.0)
HCT: 26.7 % — ABNORMAL LOW (ref 36.0–46.0)
Hemoglobin: 8.5 g/dL — ABNORMAL LOW (ref 12.0–15.0)
Lymphocytes Relative: 8 % — ABNORMAL LOW (ref 12.0–46.0)
Lymphs Abs: 1.3 10*3/uL (ref 0.7–4.0)
MCHC: 31.7 g/dL (ref 30.0–36.0)
MCV: 92.9 fl (ref 78.0–100.0)
MONO ABS: 1.5 10*3/uL — AB (ref 0.1–1.0)
Monocytes Relative: 9.4 % (ref 3.0–12.0)
Neutro Abs: 13.3 10*3/uL — ABNORMAL HIGH (ref 1.4–7.7)
Neutrophils Relative %: 80.8 % — ABNORMAL HIGH (ref 43.0–77.0)
PLATELETS: 581 10*3/uL — AB (ref 150.0–400.0)
RBC: 2.88 Mil/uL — ABNORMAL LOW (ref 3.87–5.11)
RDW: 15.8 % — ABNORMAL HIGH (ref 11.5–15.5)
WBC: 16.5 10*3/uL — ABNORMAL HIGH (ref 4.0–10.5)

## 2014-07-11 MED ORDER — HYDROCODONE-ACETAMINOPHEN 5-325 MG PO TABS: 1.0000 | ORAL_TABLET | Freq: Four times a day (QID) | ORAL | Status: DC | PRN

## 2014-07-11 NOTE — Assessment & Plan Note (Signed)
Prevnar 13 IM today. 

## 2014-07-11 NOTE — Assessment & Plan Note (Signed)
Stable. See #1 above.

## 2014-07-11 NOTE — Assessment & Plan Note (Signed)
Doing fine. I'll change pain med to vicodin 5/325, 1-2 q6h prn, #40. Continue all current meds. Check BMET and CBC today.  May need to start short term oral iron therapy. Discussed low Na intake and elevation of legs for LE swelling.  She'll stop lasix as instructed tomorrow but when I see her back in 1 wk we'll see if any of this is needed prn.

## 2014-07-11 NOTE — Progress Notes (Signed)
Pre visit review using our clinic review tool, if applicable. No additional management support is needed unless otherwise documented below in the visit note. 

## 2014-07-11 NOTE — Assessment & Plan Note (Signed)
Tsh= 9.8 07/04/2014 started  on synthroid 50 mcg qd. Plan on repeat TSH approx the first half of October 2015.

## 2014-07-11 NOTE — Progress Notes (Signed)
Office Note 07/11/2014  CC:  Chief Complaint  Patient presents with  . Establish Care   HPI:  Carrie [name] is a 63 y.o. White female who is here to establish care. Patient's most recent primary MD: Dr. Katina Dung on [address] in [place]--establishing with me now b/c she has insurance.   Old records in EPIC/HL EMR were reviewed prior to or during today's visit.  Pt got out of [facility] on 07/05/14 after getting CABG and aortic valve replacement (bovine). "I'm doing ok".  Getting energy back slowly, eating and drinking fair.   Has bp cuff at home but needs to get battery for it.  Admits bp in past has been "up and down". Says sternum feels sore.   LE swelling has been going down some.  Elevates legs some.  Not watching sodium. Has HH coming this afternoon to her house to start therapy. Pt on levaquin at d/c for UTI: currently denies dysuria or unusual urinary urgency.  Some frequency noted due to being on lasix--which she is scheduled to finish tomorrow as per hospital d/c orders.   Past Medical History  Diagnosis Date  . Hypertension   . Hyperlipidemia   . Carotid bruit   . Sinus bradycardia   . Palpitations   . Tobacco dependence in remission   . Heart murmur   . DDD (degenerative disc disease), lumbar     [place] [facility]  . Chronic lower back pain     [place] [facility]  . Coronary artery disease     CABG 06/2014.  Normal LV function.  Marland Kitchen History of aortic valve replacement 06/2014  . Hypothyroidism 06/2014    started on levothyroxine while in hospital for her CABG    Past Surgical History  Procedure Laterality Date  . Heel spur surgery Right   . Cataract extraction w/ intraocular lens  implant, bilateral Bilateral ~ 02/2014  . Hip orif w/ capsulotomy Left 1980's  . Tonsillectomy  1978  . Cardiac catheterization  06/20/2014  . Abdominal hysterectomy  1970's  . Multiple extractions with alveoloplasty [name] 06/25/2014    Procedure: Extraction of tooth #'s 2,3,4,14, 18, 19 with alveoloplasty and  gross debridement of remaining teeth;  Surgeon: Lenn Cal, DDS;  Location: [place];  Service: Oral Surgery;  Laterality: [name];  . Coronary artery bypass graft [name] 06/28/2014    Procedure: Coronary artery bypass grafting on pump using left internal mammary artery to left anterior descending artery, right greater saphenous vein to right coronary artery.;  Surgeon: Grace Isaac, MD;  Location: [place];  Service: Open Heart Surgery;  Laterality: [name];  . Aortic valve replacement [name] 06/28/2014    Procedure: Aortic valve replacement using [facility] Ease 57mm valve Serial number [id number];  Surgeon: Grace Isaac, MD;  Location: [place];  Service: Open Heart Surgery;  Laterality: [name];  . Intraoperative transesophageal echocardiogram [name] 06/28/2014    Procedure: INTRAOPERATIVE TRANSESOPHAGEAL ECHOCARDIOGRAM;  Surgeon: Grace Isaac, MD;  Location: [place];  Service: Open Heart Surgery;  Laterality: [name];    Family History  Problem Relation Age of Onset  . Hypertension Mother   . Heart disease Mother     CAD; needs pacemaker75  . Hypertension Father   . Heart disease Father 59    pacemaker  . Hyperlipidemia Brother     History   Social History  . Marital Status: Married    Spouse Name: [name]    Number of Children: [name]  . Years of Education:  [name]   Occupational History  .  [place]   Social History Main Topics  . Smoking status: Former Smoker -- 0.75 packs/day for 50 years    Types: Cigarettes    Quit date: 06/22/2014  . Smokeless tobacco: Never Used     Comment: 06/20/2014 "I've been using the e-cigarettes to try to get me off the regular cigarettes"  . Alcohol Use: No  . Drug Use: No  . Sexual Activity: Not Currently   Other Topics Concern  . Not on file   Social History Narrative   Marital status: married x 13 years      Children: 2 children; 3 grandchildren.      Lives: with husband, 2 dogs.      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  Quit 06/2014.      Alcohol:  None      Drugs; None      Exercise: none    Outpatient Encounter Prescriptions as of 07/11/2014  Medication Sig  . amiodarone (PACERONE) 200 MG tablet Take 1 tablet (200 mg total) by mouth 2 (two) times daily. For 7 Days, then decrease to 200 mg daily  . aspirin EC 325 MG EC tablet Take 1 tablet (325 mg total) by mouth daily.  Marland Kitchen atorvastatin (LIPITOR) 80 MG tablet Take 1 tablet (80 mg total) by mouth daily at 6 PM.  . furosemide (LASIX) 40 MG tablet Take 1 tablet (40 mg total) by mouth daily. For 7 Days  . levofloxacin (LEVAQUIN) 750 MG tablet Take 1 tablet (750 mg total) by mouth daily. For 9 more Days  . levothyroxine (SYNTHROID, LEVOTHROID) 50 MCG tablet Take 1 tablet (50 mcg total) by mouth daily before breakfast.  . lisinopril (PRINIVIL,ZESTRIL) 2.5 MG tablet Take 1 tablet (2.5 mg total) by mouth daily.  . metoprolol tartrate (LOPRESSOR) 25 MG tablet Take 1 tablet (25 mg total) by mouth 2 (two) times daily.  . potassium chloride 20 MEQ TBCR Take 20 mEq by mouth daily. For 7 Days  . [DISCONTINUED] oxyCODONE (OXY IR/ROXICODONE) 5 MG immediate release tablet Take 1-2 tablets (5-10 mg total) by mouth every 3 (three) hours as needed for severe pain.  . [DISCONTINUED] sulfamethoxazole-trimethoprim (BACTRIM DS) 800-160 MG per tablet Take 1 tablet by mouth 2 (two) times daily.  Marland Kitchen HYDROcodone-acetaminophen (NORCO/VICODIN) 5-325 MG per tablet Take 1-2 tablets by mouth every 6 (six) hours as needed for moderate pain.    Allergies  Allergen Reactions  . Morphine Nausea And Vomiting    ONLY IN IV FORM    ROS Review of Systems  Constitutional: Positive for fatigue. Negative for fever.  HENT: Negative for congestion and sore throat.   Eyes: Negative for visual disturbance.  Respiratory: Negative for cough.   Cardiovascular: Positive for chest pain (chest wall/sternum--at incision/scar).  Gastrointestinal: Negative for nausea and abdominal  pain.  Genitourinary: Negative for dysuria.  Musculoskeletal: Negative for back pain and joint swelling.  Skin: Negative for rash.  Neurological: Negative for weakness and headaches.  Hematological: Negative for adenopathy.    PE; Blood pressure 155/73, pulse 68, temperature 98.7 F (37.1 C), temperature source Temporal, resp. rate 20, height 5\' 4"  (1.626 m), weight 179 lb (81.194 kg), SpO2 97.00%. on room air.  Pt examined with CMA Jacklynn Ganong as chaperone. Gen: Alert, well appearing.  Patient is oriented to person, place, time, and situation. CV: RRR, 3/6 syst murmur at  apex.  No diastolic murmur or rub. LUNGS: CTA bilat, nonlabored resps.  Chest wall: sternal incision c/d/i without sign of infection. ABD: soft, NT EXT: vein harvest wounds with scab/ nontender and no signif erythema/warmth/tenderness. 3+ pitting edema bilat from knees into feet, R>L (vein harvest from right leg).  Pertinent labs:    Chemistry      Component Value Date/Time   NA 133* 07/04/2014 0310   K 4.8 07/04/2014 0310   CL 94* 07/04/2014 0310   CO2 25 07/04/2014 0310   BUN 27* 07/04/2014 0310   CREATININE 1.12* 07/04/2014 0310   CREATININE 1.35* 05/24/2014 1404      Component Value Date/Time   CALCIUM 8.4 07/04/2014 0310   ALKPHOS 73 05/24/2014 1404   AST 28 05/24/2014 1404   ALT 16 05/24/2014 1404   BILITOT 0.5 05/24/2014 1404     Lab Results  Component Value Date   WBC 8.3 07/04/2014   HGB 8.3* 07/04/2014   HCT 26.0* 07/04/2014   MCV 91.9 07/04/2014   PLT 217 07/04/2014   Lab Results  Component Value Date   TSH 9.810* 07/04/2014    ASSESSMENT AND PLAN:   New pt: obtain old records.  S/P CABG x 2 Doing fine. I'll change pain med to vicodin 5/325, 1-2 q6h prn, #40. Continue all current meds. Check BMET and CBC today.  May need to start short term oral iron therapy. Discussed low Na intake and elevation of legs for LE swelling.  She'll stop lasix as instructed tomorrow but when I see her back in 1 wk we'll  see if any of this is needed prn.  S/P AVR (aortic valve replacement) Stable. See #1 above.  Lower extremity edema Likely a combo of chronic venous insuff + recent vein harvest for CABG. No LV dysfxn on testing in hospital. See #1 above.  Hypothyroid Tsh= 9.8 07/04/2014 started  on synthroid 50 mcg qd. Plan on repeat TSH approx the first half of October 2015.   Preventative health care Prevnar 13 IM today.   An After Visit Summary was printed and given to the patient.  Return in about 1 week (around 07/18/2014) for f/u CABG/pain check/LE swelling check.

## 2014-07-11 NOTE — Assessment & Plan Note (Signed)
Likely a combo of chronic venous insuff + recent vein harvest for CABG. No LV dysfxn on testing in hospital. See #1 above.

## 2014-07-12 ENCOUNTER — Telehealth: Payer: Self-pay | Admitting: Family Medicine

## 2014-07-12 NOTE — Telephone Encounter (Signed)
Relevant patient education mailed to patient.  

## 2014-07-18 ENCOUNTER — Ambulatory Visit (INDEPENDENT_AMBULATORY_CARE_PROVIDER_SITE_OTHER): Payer: Medicare Other | Admitting: *Deleted

## 2014-07-18 ENCOUNTER — Other Ambulatory Visit: Payer: Self-pay | Admitting: *Deleted

## 2014-07-18 VITALS — BP 145/73 | HR 74 | Temp 97.3°F | Resp 16

## 2014-07-18 DIAGNOSIS — R609 Edema, unspecified: Secondary | ICD-10-CM

## 2014-07-18 DIAGNOSIS — L03115 Cellulitis of right lower limb: Secondary | ICD-10-CM

## 2014-07-18 DIAGNOSIS — Z48812 Encounter for surgical aftercare following surgery on the circulatory system: Secondary | ICD-10-CM

## 2014-07-18 DIAGNOSIS — I2510 Atherosclerotic heart disease of native coronary artery without angina pectoris: Secondary | ICD-10-CM

## 2014-07-18 DIAGNOSIS — I350 Nonrheumatic aortic (valve) stenosis: Secondary | ICD-10-CM

## 2014-07-18 DIAGNOSIS — Z951 Presence of aortocoronary bypass graft: Secondary | ICD-10-CM

## 2014-07-18 DIAGNOSIS — Z952 Presence of prosthetic heart valve: Secondary | ICD-10-CM

## 2014-07-18 MED ORDER — POTASSIUM CHLORIDE ER 20 MEQ PO TBCR: 20.0000 meq | EXTENDED_RELEASE_TABLET | Freq: Every day | ORAL | Status: DC

## 2014-07-18 MED ORDER — FUROSEMIDE 40 MG PO TABS: 40.0000 mg | ORAL_TABLET | Freq: Every day | ORAL | Status: DC

## 2014-07-18 MED ORDER — CEPHALEXIN 500 MG PO CAPS: 500.0000 mg | ORAL_CAPSULE | Freq: Three times a day (TID) | ORAL | Status: DC

## 2014-07-18 NOTE — Progress Notes (Signed)
The [facility] for Carrie [name] called with concerns regarding her right lower extremity/evh site due to redness and swelling.  Also, one of the chest tube sites needs attention.  I advised her to come in and be seen.  She did and on arrival she is noted to have swelling of both of her lower extremities, right greater than left.  There is redness and signiifcant tenderness to the lower leg.  The chest tube site in question is under her left breast.  It is just raw, but completely closed.  She is still treating a fungal infection under her breasts that was present preoperatively.  Dr. Servando Snare examined her and addressed all the concerns.  Keflex 500 mg tid x 7 days, Lasix 40 mg and K 20mg  x 7 days were prescribed.  He also gave her a script for Norco 5/325 mg # 30.  She will return next week for a recheck.  Continued leg elevation was encouraged.

## 2014-07-19 ENCOUNTER — Other Ambulatory Visit: Payer: Self-pay | Admitting: Family Medicine

## 2014-07-19 ENCOUNTER — Encounter: Payer: Self-pay | Admitting: Family Medicine

## 2014-07-19 ENCOUNTER — Ambulatory Visit (INDEPENDENT_AMBULATORY_CARE_PROVIDER_SITE_OTHER): Payer: Medicare Other | Admitting: Family Medicine

## 2014-07-19 VITALS — BP 122/75 | HR 69 | Temp 98.4°F | Resp 18 | Ht 64.0 in | Wt 172.0 lb

## 2014-07-19 DIAGNOSIS — N183 Chronic kidney disease, stage 3 unspecified: Secondary | ICD-10-CM

## 2014-07-19 DIAGNOSIS — D473 Essential (hemorrhagic) thrombocythemia: Secondary | ICD-10-CM

## 2014-07-19 DIAGNOSIS — IMO0001 Reserved for inherently not codable concepts without codable children: Secondary | ICD-10-CM

## 2014-07-19 DIAGNOSIS — T8140XA Infection following a procedure, unspecified, initial encounter: Secondary | ICD-10-CM

## 2014-07-19 DIAGNOSIS — R609 Edema, unspecified: Secondary | ICD-10-CM

## 2014-07-19 DIAGNOSIS — R799 Abnormal finding of blood chemistry, unspecified: Secondary | ICD-10-CM

## 2014-07-19 DIAGNOSIS — D62 Acute posthemorrhagic anemia: Secondary | ICD-10-CM

## 2014-07-19 DIAGNOSIS — R600 Localized edema: Secondary | ICD-10-CM

## 2014-07-19 DIAGNOSIS — D509 Iron deficiency anemia, unspecified: Secondary | ICD-10-CM

## 2014-07-19 DIAGNOSIS — R7989 Other specified abnormal findings of blood chemistry: Secondary | ICD-10-CM

## 2014-07-19 DIAGNOSIS — E875 Hyperkalemia: Secondary | ICD-10-CM

## 2014-07-19 DIAGNOSIS — D75839 Thrombocytosis, unspecified: Secondary | ICD-10-CM

## 2014-07-19 DIAGNOSIS — Z5189 Encounter for other specified aftercare: Secondary | ICD-10-CM

## 2014-07-19 DIAGNOSIS — Z951 Presence of aortocoronary bypass graft: Secondary | ICD-10-CM

## 2014-07-19 DIAGNOSIS — I200 Unstable angina: Secondary | ICD-10-CM

## 2014-07-19 DIAGNOSIS — D72829 Elevated white blood cell count, unspecified: Secondary | ICD-10-CM

## 2014-07-19 DIAGNOSIS — T814XXD Infection following a procedure, subsequent encounter: Secondary | ICD-10-CM

## 2014-07-19 LAB — CBC WITH DIFFERENTIAL/PLATELET
BASOS ABS: 0.2 10*3/uL — AB (ref 0.0–0.1)
Basophils Relative: 1.8 % (ref 0.0–3.0)
Eosinophils Absolute: 0.6 10*3/uL (ref 0.0–0.7)
Eosinophils Relative: 5.2 % — ABNORMAL HIGH (ref 0.0–5.0)
HCT: 30.4 % — ABNORMAL LOW (ref 36.0–46.0)
HEMOGLOBIN: 9.7 g/dL — AB (ref 12.0–15.0)
LYMPHS PCT: 16.1 % (ref 12.0–46.0)
Lymphs Abs: 1.9 10*3/uL (ref 0.7–4.0)
MCHC: 32 g/dL (ref 30.0–36.0)
MCV: 90.2 fl (ref 78.0–100.0)
MONOS PCT: 9.9 % (ref 3.0–12.0)
Monocytes Absolute: 1.2 10*3/uL — ABNORMAL HIGH (ref 0.1–1.0)
NEUTROS PCT: 67 % (ref 43.0–77.0)
Neutro Abs: 7.9 10*3/uL — ABNORMAL HIGH (ref 1.4–7.7)
Platelets: 409 10*3/uL — ABNORMAL HIGH (ref 150.0–400.0)
RBC: 3.37 Mil/uL — ABNORMAL LOW (ref 3.87–5.11)
RDW: 16.5 % — AB (ref 11.5–15.5)
WBC: 11.8 10*3/uL — ABNORMAL HIGH (ref 4.0–10.5)

## 2014-07-19 LAB — BASIC METABOLIC PANEL
BUN: 24 mg/dL — ABNORMAL HIGH (ref 6–23)
CHLORIDE: 102 meq/L (ref 96–112)
CO2: 27 meq/L (ref 19–32)
CREATININE: 1.7 mg/dL — AB (ref 0.4–1.2)
Calcium: 8.9 mg/dL (ref 8.4–10.5)
GFR: 31.81 mL/min — ABNORMAL LOW (ref >60.00)
Glucose, Bld: 86 mg/dL (ref 70–99)
POTASSIUM: 6.1 meq/L — AB (ref 3.5–5.1)
SODIUM: 136 meq/L (ref 135–145)

## 2014-07-19 NOTE — Progress Notes (Signed)
Pre visit review using our clinic review tool, if applicable. No additional management support is needed unless otherwise documented below in the visit note. 

## 2014-07-19 NOTE — Progress Notes (Signed)
OFFICE NOTE  07/21/2014  CC:  Chief Complaint  Patient presents with  . Follow-up   HPI: Patient is a 63 y.o. Caucasian female who is here for 1 wk f/u LE swelling, s/p recent CABG. Labs last week showed elevated wBC and PLTs, Hb 8.5.  Started iron and will repeat CBC w/diff today. Her Cr bumped from 1.12 to 1.6 in the week after hospital d/c.  She has finished all lasix and was instructed to hydrate well. She finished levaquin for foley-associated UTI in hosp. She was instructed on leg elevation prn and low Na diet for her LE edema.  She says her leg swelling is a bit improved but not much. Recently had some redness and pain on right lower leg vein harvest scar--her heart MD was contacted and pt was started on keflex and she already has seen/felt improvement.  Was also apparently put back on lasix for LE swelling yesterday but she says she did not start it.    Her chest wall/incision pain has required 2 vicodin tid since I saw her last. BMs are good. No urinary complaints.  No cough or fevers.   Pertinent PMH:  Past medical, surgical, social, and family history reviewed and no changes are noted since last office visit.  MEDS:  Outpatient Prescriptions Prior to Visit  Medication Sig Dispense Refill  . amiodarone (PACERONE) 200 MG tablet Take 1 tablet (200 mg total) by mouth 2 (two) times daily. For 7 Days, then decrease to 200 mg daily  60 tablet  1  . aspirin EC 325 MG EC tablet Take 1 tablet (325 mg total) by mouth daily.  30 tablet  0  . atorvastatin (LIPITOR) 80 MG tablet Take 1 tablet (80 mg total) by mouth daily at 6 PM.  30 tablet  3  . cephALEXin (KEFLEX) 500 MG capsule Take 500 mg by mouth 3 (three) times daily. BEGIN 07/18/14  END 07/25/14      . HYDROcodone-acetaminophen (NORCO/VICODIN) 5-325 MG per tablet Take 1-2 tablets by mouth every 6 (six) hours as needed for moderate pain. RENEWED 07/18/14      . levothyroxine (SYNTHROID, LEVOTHROID) 50 MCG tablet Take 1 tablet (50 mcg  total) by mouth daily before breakfast.  30 tablet  3  . metoprolol tartrate (LOPRESSOR) 25 MG tablet Take 1 tablet (25 mg total) by mouth 2 (two) times daily.  60 tablet  3  . lisinopril (PRINIVIL,ZESTRIL) 2.5 MG tablet Take 1 tablet (2.5 mg total) by mouth daily.  30 tablet  3  . furosemide (LASIX) 40 MG tablet Take 1 tablet (40 mg total) by mouth daily. For 7 Days  7 tablet  0  . Potassium Chloride ER 20 MEQ TBCR Take 20 mEq by mouth daily. For 7 Days  7 tablet  0   No facility-administered medications prior to visit.    PE: Blood pressure 122/75, pulse 69, temperature 98.4 F (36.9 C), temperature source Temporal, resp. rate 18, height 5\' 4"  (1.626 m), weight 172 lb (78.019 kg), SpO2 97.00%. Gen: Alert, well appearing.  Patient is oriented to person, place, time, and situation. CV: RRR, 2-3/6 syst murmur at base, no diastolic murmur, no rub or gallop. Chest is clear, no wheezing or rales. Normal symmetric air entry throughout both lung fields. No chest wall deformities or tenderness. Right lower leg incision/scar well approximated but has some areas of erythema around the edges and tenderness diffusely over the incision.  No fluctuance or streaking.   LE pitting edema 3+  bilat.  IMPRESSION AND PLAN:  S/P CABG x 2 Stable, chest wall pain gradually improving. She did get #30 more vicodin yesterday at her CV surgeon's office. No rx for pain med here today.  After she's finished with this round of vicodin I told her we'd be stepping down to tramadol.  Lower extremity edema Venous insufficiency + recent vein harvesting for CABG in R leg. I am hesitant to use any further lasix since her renal function did not improve any since last visit (and being off lasix for about a week). Encouraged activity, elevation, low Na diet.  Asked pt to HOLD lasix at this time since she does not have any sign of CHF.  Surgical wound infection Improving already since CV surgeon's office started  keflex. Continue.   Postoperative anemia due to acute blood loss Pre-op Hb 11.4. Pt to start FeSO4 325mg  bid.  Chronic renal insufficiency Baseline Cr 1.0-1.1 prior to d/c from hosp. One week after d/c after being on lasix daily her Cr bumped to 1.6. Continue to hold lasix as long as no sign of pulm edema/chf--recheck BMET today.  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 Likely reactive + she had a UTI last CBC check and in the last couple of days has a wound infection. Recheck  CBC today.  An After Visit Summary was printed and given to the patient.  FOLLOW UP: 1 month

## 2014-07-21 ENCOUNTER — Encounter: Payer: Self-pay | Admitting: Family Medicine

## 2014-07-21 DIAGNOSIS — N189 Chronic kidney disease, unspecified: Secondary | ICD-10-CM | POA: Insufficient documentation

## 2014-07-21 DIAGNOSIS — R7989 Other specified abnormal findings of blood chemistry: Secondary | ICD-10-CM | POA: Insufficient documentation

## 2014-07-21 DIAGNOSIS — T8149XA Infection following a procedure, other surgical site, initial encounter: Secondary | ICD-10-CM | POA: Insufficient documentation

## 2014-07-21 DIAGNOSIS — D62 Acute posthemorrhagic anemia: Secondary | ICD-10-CM | POA: Insufficient documentation

## 2014-07-21 NOTE — Assessment & Plan Note (Signed)
Venous insufficiency + recent vein harvesting for CABG in R leg. I am hesitant to use any further lasix since her renal function did not improve any since last visit (and being off lasix for about a week). Encouraged activity, elevation, low Na diet.  Asked pt to HOLD lasix at this time since she does not have any sign of CHF.

## 2014-07-21 NOTE — Assessment & Plan Note (Signed)
Improving already since CV surgeon's office started keflex. Continue.

## 2014-07-21 NOTE — Assessment & Plan Note (Signed)
Pre-op Hb 11.4. Pt to start FeSO4 325mg  bid.

## 2014-07-21 NOTE — Assessment & Plan Note (Signed)
Stable, chest wall pain gradually improving. She did get #30 more vicodin yesterday at her CV surgeon's office. No rx for pain med here today.  After she's finished with this round of vicodin I told her we'd be stepping down to tramadol.

## 2014-07-21 NOTE — Assessment & Plan Note (Signed)
Baseline Cr 1.0-1.1 prior to d/c from hosp. One week after d/c after being on lasix daily her Cr bumped to 1.6. Continue to hold lasix as long as no sign of pulm edema/chf--recheck BMET today.

## 2014-07-22 ENCOUNTER — Ambulatory Visit (INDEPENDENT_AMBULATORY_CARE_PROVIDER_SITE_OTHER): Payer: Medicare Other | Admitting: Physician Assistant

## 2014-07-22 VITALS — BP 145/72 | HR 68 | Resp 20 | Ht 64.0 in | Wt 172.0 lb

## 2014-07-22 DIAGNOSIS — L03119 Cellulitis of unspecified part of limb: Secondary | ICD-10-CM

## 2014-07-22 DIAGNOSIS — Z952 Presence of prosthetic heart valve: Secondary | ICD-10-CM

## 2014-07-22 DIAGNOSIS — L02419 Cutaneous abscess of limb, unspecified: Secondary | ICD-10-CM

## 2014-07-22 DIAGNOSIS — Z954 Presence of other heart-valve replacement: Secondary | ICD-10-CM

## 2014-07-22 DIAGNOSIS — Z951 Presence of aortocoronary bypass graft: Secondary | ICD-10-CM

## 2014-07-22 DIAGNOSIS — L03115 Cellulitis of right lower limb: Secondary | ICD-10-CM

## 2014-07-22 NOTE — Progress Notes (Signed)
  HPI:  Patient returns for routine postoperative follow-up having undergone a CABG x 2 and AVR (Pericardial tissue valve) on 06/28/2014 by Dr. Servando Snare.The patient's early postoperative recovery while in the hospital was notable for atrial fibrillation, but she later converted and stayed in sinus rhythm. She was found to have a gram negative rod UTI and was placed on Levaquin. Finally, her TSH was elevated at 9.810 and she was placed on Synthroid. She was also instructed to follow up with her medical doctor regarding further management of this. Since hospital discharge the patient reports RLE swelling and redness. She denies any fever, chills, drainage from RLE. She finished Levaquin for the UTI and her urinary symptoms have resolved.     Current Outpatient Prescriptions  Medication Sig Dispense Refill  . amiodarone (PACERONE) 200 MG tablet Take 1 tablet (200 mg total) by mouth 2 (two) times daily. For 7 Days, then decrease to 200 mg daily  60 tablet  1  . aspirin EC 325 MG EC tablet Take 1 tablet (325 mg total) by mouth daily.  30 tablet  0  . atorvastatin (LIPITOR) 80 MG tablet Take 1 tablet (80 mg total) by mouth daily at 6 PM.  30 tablet  3  . cephALEXin (KEFLEX) 500 MG capsule Take 500 mg by mouth 3 (three) times daily. BEGIN 07/18/14  END 07/25/14      . HYDROcodone-acetaminophen (NORCO/VICODIN) 5-325 MG per tablet Take 1-2 tablets by mouth every 6 (six) hours as needed for moderate pain. RENEWED 07/18/14      . levothyroxine (SYNTHROID, LEVOTHROID) 50 MCG tablet Take 1 tablet (50 mcg total) by mouth daily before breakfast.  30 tablet  3  . metoprolol tartrate (LOPRESSOR) 25 MG tablet Take 1 tablet (25 mg total) by mouth 2 (two) times daily.  60 tablet  3   No current facility-administered medications for this visit.    Physical Exam: Cardiovascular: RRR Pulmonary: Slightly decreased at left base Extremities: Mild RLE edema, no LLE edema Wounds: Sternal wound is clean, dry, and no signs of  infection. LUE wound has small eschar no erythema. Ecchymosis bilateral thighs. RLE has slight erythema and at it and slightly below. Small eschar below knee. No drainage or purulence   Impression and Plan: Regarding, RLE wound, patient on Keflex (started last week). I instructed her to finish this and I gave her a prescription for one more week as well. She was instructed by Dr. Anitra Lauth to stop Lasix and potassium supplement as creatinine increased to 1.6. Her RLE swelling is minor and will monitor for now.She was instructed to follow up with him regarding further monitoring of creatinine. She does elevate her legs as much as possible and was encouraged to continue to do so. I instructed her if she develops fever, chills, drainage, increased swelling or redness to contact the office as soon as possible. She will return to see [place] in one week for another wound check. She has an appointment to see Richardson Dopp PA-C on 9/3 as well.

## 2014-07-23 DIAGNOSIS — Z9289 Personal history of other medical treatment: Secondary | ICD-10-CM

## 2014-07-23 HISTORY — DX: Personal history of other medical treatment: Z92.89

## 2014-07-25 ENCOUNTER — Other Ambulatory Visit: Payer: Self-pay | Admitting: *Deleted

## 2014-07-25 ENCOUNTER — Ambulatory Visit (INDEPENDENT_AMBULATORY_CARE_PROVIDER_SITE_OTHER): Payer: Medicare Other | Admitting: Physician Assistant

## 2014-07-25 ENCOUNTER — Encounter: Payer: Self-pay | Admitting: Physician Assistant

## 2014-07-25 ENCOUNTER — Telehealth: Payer: Self-pay | Admitting: *Deleted

## 2014-07-25 VITALS — BP 130/60 | HR 69 | Ht 64.0 in | Wt 166.0 lb

## 2014-07-25 DIAGNOSIS — I10 Essential (primary) hypertension: Secondary | ICD-10-CM

## 2014-07-25 DIAGNOSIS — I200 Unstable angina: Secondary | ICD-10-CM

## 2014-07-25 DIAGNOSIS — I350 Nonrheumatic aortic (valve) stenosis: Secondary | ICD-10-CM

## 2014-07-25 DIAGNOSIS — E7800 Pure hypercholesterolemia, unspecified: Secondary | ICD-10-CM

## 2014-07-25 DIAGNOSIS — I480 Paroxysmal atrial fibrillation: Secondary | ICD-10-CM

## 2014-07-25 DIAGNOSIS — G8918 Other acute postprocedural pain: Secondary | ICD-10-CM

## 2014-07-25 DIAGNOSIS — I359 Nonrheumatic aortic valve disorder, unspecified: Secondary | ICD-10-CM

## 2014-07-25 DIAGNOSIS — I4891 Unspecified atrial fibrillation: Secondary | ICD-10-CM | POA: Insufficient documentation

## 2014-07-25 DIAGNOSIS — I2510 Atherosclerotic heart disease of native coronary artery without angina pectoris: Secondary | ICD-10-CM

## 2014-07-25 DIAGNOSIS — Z952 Presence of prosthetic heart valve: Secondary | ICD-10-CM

## 2014-07-25 DIAGNOSIS — Z954 Presence of other heart-valve replacement: Secondary | ICD-10-CM

## 2014-07-25 LAB — BASIC METABOLIC PANEL
BUN: 22 mg/dL (ref 6–23)
CHLORIDE: 102 meq/L (ref 96–112)
CO2: 22 meq/L (ref 19–32)
Calcium: 8.9 mg/dL (ref 8.4–10.5)
Creatinine, Ser: 1.6 mg/dL — ABNORMAL HIGH (ref 0.4–1.2)
GFR: 33.38 mL/min — ABNORMAL LOW (ref >60.00)
GLUCOSE: 88 mg/dL (ref 70–99)
POTASSIUM: 5 meq/L (ref 3.5–5.1)
Sodium: 136 mEq/L (ref 135–145)

## 2014-07-25 MED ORDER — HYDROCODONE-ACETAMINOPHEN 5-325 MG PO TABS: 1.0000 | ORAL_TABLET | Freq: Four times a day (QID) | ORAL | Status: DC | PRN

## 2014-07-25 MED ORDER — AMIODARONE HCL 200 MG PO TABS: 200.0000 mg | ORAL_TABLET | Freq: Every day | ORAL | Status: DC

## 2014-07-25 NOTE — Patient Instructions (Signed)
LAB WORK TODAY;BMET  Your physician has requested that you have an echocardiogram. Echocardiography is a painless test that uses sound waves to create images of your heart. It provides your doctor with information about the size and shape of your heart and how well your heart's chambers and valves are working. This procedure takes approximately one hour. There are no restrictions for this procedure.  You have been referred to CARD[facility]HAB AT [facility]  TAKE AMIODARONE FOR 1 MONTH THEN STOP

## 2014-07-25 NOTE — Telephone Encounter (Signed)
New pain med script signed by Ellwood Handler, P.A. for office pickup.

## 2014-07-25 NOTE — Progress Notes (Signed)
Cardiology Office Note    Date:  07/25/2014   ID:  MALEIGHA [name], DOB [birth year], MRN [id number]  PCP:  Tammi Sou, MD  Cardiologist:  Dr. Thompson Grayer     History of Present Illness: Carrie [name] is a 63 y.o. female with a hx of AS, HTN, HL, tobaccos abuse.  She was evaluated by Dr. Thompson Grayer in July for chest pain.  She was set up for echo and cardiac cath.  Echo demonstrated severe AS and LHC demonstrated severe 2 v CAD.  She was referred for CABG+AVR.  She was admitted 7/30-8/15.  She underwent extensive dental extraction prior to surgery.  She underwent CABG x 2 (L-LAD, S-RCA) and bioprosthetic AVR 8/7 with Dr. Servando Snare.  Post op course was c/b Citrobacter UTI and AFib with RVR.  She converted to NSR on Amiodarone.  She was also started on Synthroid for hypothyroidism.  She had some drainage from her SVG harvest site post DC and was placed on antibiotics.  She was noted to have worsening creatinine as well and her Lasix was DC'd.  She returns for FU.    She continues to feel better.  Strength is improved. She denies significant dyspnea.  She denies orthopnea, PND.  RLE SVG harvesting site looks better.  No erythema or discharge. RLE edema is improved as well.  Chest soreness is improving.  She denies syncope.     Studies:  - LHC (7/15):  Ostial/prox 40%, mid LAD 60%, mid CFX 40%, ostial RCA 95% followed by prox RCA 95%, EF 70%, AV mean 45 mmHg  - Echo (7/15):  EF 60-65%, no RWMA, Gr 1 DD, severe AS (mean 43 mmHg), mild MR, mild LAE  - Carotid US (7/15):  R 40-59%; L 60-79%   Recent Labs/Images: 05/24/2014: ALT 16  07/04/2014: TSH 9.810*  07/19/2014: Hemoglobin 9.7*  07/25/2014: Creatinine 1.6*; Potassium 5.0    Wt Readings from Last 3 Encounters:  07/25/14 166 lb (75.297 kg)  07/22/14 172 lb (78.019 kg)  07/19/14 172 lb (78.019 kg)     Past Medical History  Diagnosis Date  . Hypertension   . Hyperlipidemia   . Carotid bruit   . Sinus bradycardia   . Tobacco  dependence in remission   . DDD (degenerative disc disease), lumbar     [facility]  . Chronic lower back pain     [facility]  . Coronary artery disease     CABG 06/2014.  Normal LV function.  Marland Kitchen History of aortic valve replacement 06/2014    (pericardial tissue valve) for AS  . Hypothyroidism 06/2014    started on levothyroxine while in hospital for her CABG    Current Outpatient Prescriptions  Medication Sig Dispense Refill  . amiodarone (PACERONE) 200 MG tablet Take 1 tablet (200 mg total) by mouth daily.  30 tablet  0  . aspirin EC 325 MG EC tablet Take 1 tablet (325 mg total) by mouth daily.  30 tablet  0  . atorvastatin (LIPITOR) 80 MG tablet Take 1 tablet (80 mg total) by mouth daily at 6 PM.  30 tablet  3  . cephALEXin (KEFLEX) 500 MG capsule Take 500 mg by mouth 3 (three) times daily. BEGIN 07/18/14  END 07/25/14      . HYDROcodone-acetaminophen (NORCO/VICODIN) 5-325 MG per tablet Take 1 tablet by mouth every 6 (six) hours as needed (one or two tablets every eight hours prn). One tablet every six hours as needed for pain  40 tablet  0  . levothyroxine (SYNTHROID, LEVOTHROID) 50 MCG tablet Take 1 tablet (50 mcg total) by mouth daily before breakfast.  30 tablet  3  . lisinopril (PRINIVIL,ZESTRIL) 20 MG tablet Take 20 mg by mouth daily.      . metoprolol tartrate (LOPRESSOR) 25 MG tablet Take 1 tablet (25 mg total) by mouth 2 (two) times daily.  60 tablet  3   No current facility-administered medications for this visit.     Allergies:   Morphine   Social History:  The patient  reports that she quit smoking about 4 weeks ago. Her smoking use included Cigarettes. She has a 37.5 pack-year smoking history. She has never used smokeless tobacco. She reports that she does not drink alcohol or use illicit drugs.   Family History:  The patient's family history includes Heart disease in her mother; Heart disease (age of onset: 1) in her father; Hyperlipidemia in her brother; Hypertension in her  father and mother; Stroke in her mother. There is no history of Heart attack.   ROS:  Please see the history of present illness.   She has occasional nausea.  Denies fever, cough, bleeding.   All other systems reviewed and negative.   PHYSICAL EXAM: VS:  BP 130/60  Pulse 69  Ht 5\' 4"  (1.626 m)  Wt 166 lb (75.297 kg)  BMI 28.48 kg/m2 Well nourished, well developed, in no acute distress HEENT: normal Neck: no JVDat 90 degrees  Cardiac:  normal S1, S2; RRR; 1/6 systolic murmurat RUSB Chest:  Median sternotomy wound well healed without erythema or discharge Lungs:  clear to auscultation bilaterally, no wheezing, rhonchi or rales Abd: soft, nontender, no hepatomegaly Ext: trace RLE and no LLE edemaRLE SVG harvest site well healed without erythema or discharge Skin: warm and dry Neuro:  CNs 2-12 intact, no focal abnormalities noted  EKG:  NSR, HR 69, normal axis, no ST changes, QTc 443 ms     ASSESSMENT AND PLAN:  1. Coronary artery disease involving native coronary artery of native heart without angina pectoris:  She is progressing well since recent CABG+AVR.  She had some difficulty with drainage from her SVG harvest site.  But, this is much improved/resolved.  She sees Dr. Servando Snare soon.  She has HHPT coming to her house now.  I will also refer her to Cardiac Rehab to start after HHPT is complete.  Lasix was stopped recently due to AKI.  Recent BMET demonstrated hyperkalemia.  I will repeat the BMET today. 2. Aortic stenosis S/P AVR (aortic valve replacement):  Doing well since her surgery.  Arrange FU echo.  We discussed SBE prophylaxis.   3. Paroxysmal atrial fibrillation:  Maintaining NSR.  DC Amiodarone after 1 month. 4. HYPERTENSION - Controlled. Plan: Basic Metabolic Panel (BMET) 5. HYPERCHOLESTEROLEMIA:  Continue statin.      Disposition:  FU with me in 4-6 weeks and Dr. Thompson Grayer in 3 mos.    Signed, Versie Starks, MHS 07/25/2014 3:53 PM    [facility]  [facility], [place], [place]  [address] Phone: [phone]; Fax: [phone]

## 2014-07-26 ENCOUNTER — Telehealth: Payer: Self-pay | Admitting: *Deleted

## 2014-07-26 DIAGNOSIS — I10 Essential (primary) hypertension: Secondary | ICD-10-CM

## 2014-07-26 DIAGNOSIS — N183 Chronic kidney disease, stage 3 unspecified: Secondary | ICD-10-CM

## 2014-07-26 NOTE — Telephone Encounter (Signed)
pt notified about lab results and to HOLD Lisinopril x 1 week then resume. BMET to be done on 9/17.Marland Kitchen Pt verbalized understanding to Plan of Care.

## 2014-07-31 ENCOUNTER — Telehealth: Payer: Self-pay

## 2014-07-31 DIAGNOSIS — G8918 Other acute postprocedural pain: Secondary | ICD-10-CM

## 2014-07-31 MED ORDER — TRAMADOL HCL 50 MG PO TABS: ORAL_TABLET | ORAL | Status: DC

## 2014-07-31 NOTE — Telephone Encounter (Signed)
Rx faxed to pharmacy  

## 2014-07-31 NOTE — Telephone Encounter (Signed)
Pt called requesting a refill on Tramadol. Last Ov 07/19/2014.

## 2014-07-31 NOTE — Telephone Encounter (Signed)
Tramadol rx printed.

## 2014-08-01 ENCOUNTER — Other Ambulatory Visit: Payer: Self-pay | Admitting: Cardiothoracic Surgery

## 2014-08-01 ENCOUNTER — Ambulatory Visit (INDEPENDENT_AMBULATORY_CARE_PROVIDER_SITE_OTHER): Payer: Self-pay | Admitting: Physician Assistant

## 2014-08-01 ENCOUNTER — Ambulatory Visit
Admission: RE | Admit: 2014-08-01 | Discharge: 2014-08-01 | Disposition: A | Discharge status: Home / Self Care | Payer: Medicare Other | Source: Ambulatory Visit | Attending: Cardiothoracic Surgery | Admitting: Cardiothoracic Surgery

## 2014-08-01 VITALS — BP 179/86 | HR 68 | Temp 98.0°F | Ht 64.0 in | Wt 166.0 lb

## 2014-08-01 DIAGNOSIS — L03119 Cellulitis of unspecified part of limb: Secondary | ICD-10-CM

## 2014-08-01 DIAGNOSIS — L02419 Cutaneous abscess of limb, unspecified: Secondary | ICD-10-CM

## 2014-08-01 DIAGNOSIS — I359 Nonrheumatic aortic valve disorder, unspecified: Secondary | ICD-10-CM

## 2014-08-01 DIAGNOSIS — L03115 Cellulitis of right lower limb: Secondary | ICD-10-CM

## 2014-08-01 NOTE — Progress Notes (Signed)
  HPI:  Patient returns for follow up of right lower extremity wound. She is s/p CABG x 2 and AVR (Pericardial tissue valve) on 06/28/2014 by Dr. Servando Snare.The patient's early postoperative recovery while in the hospital was notable for atrial fibrillation, but she later converted and stayed in sinus rhythm. She was also found to have a gram negative rod UTI and was placed on Levaquin. Finally, her TSH was elevated at 9.810 and she was placed on Synthroid. She was also instructed to follow up with her medical doctor regarding further management of this. Since hospital discharge, the patient reports RLE swelling and redness. She finished Levaquin for the UTI and her urinary symptoms have resolved. She is almost finished with Keflex.She states there is less redness and tenderness of her right lower extremity.  She denies any fever, chills, or drainage from RLE.   Current Outpatient Prescriptions  Medication Sig Dispense Refill  . amiodarone (PACERONE) 200 MG tablet Take 1 tablet (200 mg total) by mouth daily.  30 tablet  0  . aspirin EC 325 MG EC tablet Take 1 tablet (325 mg total) by mouth daily.  30 tablet  0  . atorvastatin (LIPITOR) 80 MG tablet Take 1 tablet (80 mg total) by mouth daily at 6 PM.  30 tablet  3  . HYDROcodone-acetaminophen (NORCO/VICODIN) 5-325 MG per tablet Take 1 tablet by mouth every 6 (six) hours as needed for moderate pain.      Marland Kitchen levothyroxine (SYNTHROID, LEVOTHROID) 50 MCG tablet Take 1 tablet (50 mcg total) by mouth daily before breakfast.  30 tablet  3  . metoprolol tartrate (LOPRESSOR) 25 MG tablet Take 1 tablet (25 mg total) by mouth 2 (two) times daily.  60 tablet  3  . lisinopril (PRINIVIL,ZESTRIL) 20 MG tablet Take 20 mg by mouth daily.       No current facility-administered medications for this visit.    Physical Exam: CV RRR Extremities Trace right lower extremity edema Wounds Sternal wound is clean and dry. No signs of infection. Right lower extremity wound has no  erythema, minimal tenderness, and no drainage.   Impression and Plan: Patient's right lower extremity wound is continuing to improve. There is no cellulitis. She was instructed to finish antibiotics. She requested more narcotics, mostly for back pain. I had recently given her a prescription on 8/31. I told her to contact her medical doctor. Home PT is almost finished. She was encouraged to participate in cardiac rehab. She inquired about driving. Dr. Servando Snare instructed her to wait 1-2 weeks then she may begin driving short distances (i.e. 30 minutes or less during the day) as long as she is not having any pain (leg or back). She has not had a PA/LAT CXR post op yet. We are going to have this done today and will follow up on the results. Her blood pressure is high this visit. She is not taking Lisinopril secondary to elevated creatinine (1.6 on 9/3). She has a follow up appointment to see cardiology in a few weeks and this will be addressed at that time. She will return to see Dr. Servando Snare in about 5 weeks.

## 2014-08-02 ENCOUNTER — Other Ambulatory Visit (HOSPITAL_COMMUNITY): Payer: Self-pay

## 2014-08-05 ENCOUNTER — Telehealth: Payer: Self-pay | Admitting: Family Medicine

## 2014-08-05 NOTE — Telephone Encounter (Signed)
Patient is requesting refills to be sent to [facility]

## 2014-08-05 NOTE — Telephone Encounter (Signed)
I don't see this medication on pt's current med list.  Please advise.

## 2014-08-06 ENCOUNTER — Other Ambulatory Visit: Payer: Self-pay | Admitting: Family Medicine

## 2014-08-06 MED ORDER — TRAMADOL HCL 50 MG PO TABS: ORAL_TABLET | ORAL | Status: DC

## 2014-08-06 NOTE — Telephone Encounter (Signed)
Left detailed message on pt's home phone.  Okay per DPR. 

## 2014-08-06 NOTE — Telephone Encounter (Signed)
Tramadol rx printed. Tell pt that this is her last pain med rx.  After she finishes this she needs to use tylenol [phone] mg every 6 hours as needed for pain.--thx

## 2014-08-08 ENCOUNTER — Other Ambulatory Visit: Payer: Self-pay

## 2014-08-08 ENCOUNTER — Ambulatory Visit: Payer: Self-pay | Admitting: Cardiothoracic Surgery

## 2014-08-09 ENCOUNTER — Encounter: Payer: Self-pay | Admitting: Physician Assistant

## 2014-08-09 ENCOUNTER — Ambulatory Visit (HOSPITAL_COMMUNITY): Payer: Medicare Other | Attending: Physician Assistant | Admitting: Cardiology

## 2014-08-09 ENCOUNTER — Telehealth: Payer: Self-pay | Admitting: *Deleted

## 2014-08-09 DIAGNOSIS — Z952 Presence of prosthetic heart valve: Secondary | ICD-10-CM

## 2014-08-09 DIAGNOSIS — E785 Hyperlipidemia, unspecified: Secondary | ICD-10-CM | POA: Diagnosis not present

## 2014-08-09 DIAGNOSIS — Z954 Presence of other heart-valve replacement: Secondary | ICD-10-CM | POA: Diagnosis not present

## 2014-08-09 DIAGNOSIS — I4891 Unspecified atrial fibrillation: Secondary | ICD-10-CM | POA: Insufficient documentation

## 2014-08-09 DIAGNOSIS — I2510 Atherosclerotic heart disease of native coronary artery without angina pectoris: Secondary | ICD-10-CM | POA: Insufficient documentation

## 2014-08-09 DIAGNOSIS — I359 Nonrheumatic aortic valve disorder, unspecified: Secondary | ICD-10-CM

## 2014-08-09 DIAGNOSIS — I350 Nonrheumatic aortic (valve) stenosis: Secondary | ICD-10-CM

## 2014-08-09 DIAGNOSIS — I080 Rheumatic disorders of both mitral and aortic valves: Secondary | ICD-10-CM | POA: Diagnosis not present

## 2014-08-09 DIAGNOSIS — I25810 Atherosclerosis of coronary artery bypass graft(s) without angina pectoris: Secondary | ICD-10-CM

## 2014-08-09 DIAGNOSIS — I10 Essential (primary) hypertension: Secondary | ICD-10-CM | POA: Diagnosis not present

## 2014-08-09 NOTE — Progress Notes (Signed)
Echo performed. 

## 2014-08-09 NOTE — Telephone Encounter (Signed)
pt notifiied about lab results with verbal understanding.  

## 2014-08-16 ENCOUNTER — Other Ambulatory Visit: Payer: Self-pay

## 2014-08-16 ENCOUNTER — Ambulatory Visit: Payer: Medicare Other | Admitting: Family Medicine

## 2014-08-22 ENCOUNTER — Other Ambulatory Visit (INDEPENDENT_AMBULATORY_CARE_PROVIDER_SITE_OTHER): Payer: Medicare Other | Admitting: *Deleted

## 2014-08-22 DIAGNOSIS — N183 Chronic kidney disease, stage 3 unspecified: Secondary | ICD-10-CM

## 2014-08-22 DIAGNOSIS — I10 Essential (primary) hypertension: Secondary | ICD-10-CM

## 2014-08-22 LAB — BASIC METABOLIC PANEL
BUN: 25 mg/dL — AB (ref 6–23)
CALCIUM: 8.9 mg/dL (ref 8.4–10.5)
CO2: 18 meq/L — AB (ref 19–32)
CREATININE: 1.6 mg/dL — AB (ref 0.4–1.2)
Chloride: 110 mEq/L (ref 96–112)
GFR: 35.62 mL/min — ABNORMAL LOW (ref >60.00)
Glucose, Bld: 103 mg/dL — ABNORMAL HIGH (ref 70–99)
Potassium: 3.2 mEq/L — ABNORMAL LOW (ref 3.5–5.1)
Sodium: 137 mEq/L (ref 135–145)

## 2014-08-23 ENCOUNTER — Telehealth: Payer: Self-pay

## 2014-08-23 DIAGNOSIS — E876 Hypokalemia: Secondary | ICD-10-CM

## 2014-08-23 MED ORDER — POTASSIUM CHLORIDE CRYS ER 20 MEQ PO TBCR: 20.0000 meq | EXTENDED_RELEASE_TABLET | Freq: Two times a day (BID) | ORAL | Status: DC

## 2014-08-29 ENCOUNTER — Ambulatory Visit (HOSPITAL_COMMUNITY): Payer: Self-pay

## 2014-08-30 ENCOUNTER — Other Ambulatory Visit (INDEPENDENT_AMBULATORY_CARE_PROVIDER_SITE_OTHER): Payer: Medicare Other | Admitting: *Deleted

## 2014-08-30 DIAGNOSIS — E876 Hypokalemia: Secondary | ICD-10-CM

## 2014-08-30 LAB — BASIC METABOLIC PANEL
BUN: 13 mg/dL (ref 6–23)
CO2: 26 mEq/L (ref 19–32)
CREATININE: 1.3 mg/dL — AB (ref 0.4–1.2)
Calcium: 8.8 mg/dL (ref 8.4–10.5)
Chloride: 106 mEq/L (ref 96–112)
GFR: 45.65 mL/min — AB (ref >60.00)
GLUCOSE: 99 mg/dL (ref 70–99)
Potassium: 4.2 mEq/L (ref 3.5–5.1)
Sodium: 138 mEq/L (ref 135–145)

## 2014-09-02 ENCOUNTER — Ambulatory Visit (INDEPENDENT_AMBULATORY_CARE_PROVIDER_SITE_OTHER): Payer: Self-pay | Admitting: Surgical

## 2014-09-02 ENCOUNTER — Ambulatory Visit (HOSPITAL_COMMUNITY): Payer: Self-pay

## 2014-09-02 VITALS — BP 214/92 | HR 53 | Resp 20 | Ht 64.0 in | Wt 166.0 lb

## 2014-09-02 DIAGNOSIS — Z954 Presence of other heart-valve replacement: Secondary | ICD-10-CM

## 2014-09-02 DIAGNOSIS — Z951 Presence of aortocoronary bypass graft: Secondary | ICD-10-CM

## 2014-09-02 DIAGNOSIS — Z952 Presence of prosthetic heart valve: Secondary | ICD-10-CM

## 2014-09-02 DIAGNOSIS — L03115 Cellulitis of right lower limb: Secondary | ICD-10-CM

## 2014-09-02 MED ORDER — TRAMADOL HCL 50 MG PO TABS: ORAL_TABLET | ORAL | Status: DC

## 2014-09-02 NOTE — Patient Instructions (Signed)
May return to work 2-4 hours per day with light duty. She is not to lift more than 15 pounds.

## 2014-09-02 NOTE — Progress Notes (Signed)
[address]       [place] [address]             [phone]                  Rebecka D Teagle [facility] Medical Record #[id number] Date of Birth: 1951/01/12  Referring QM:[id number], Ander Slade, MD Primary Cardiology: Primary Care:MCGOWEN,PHILIP H, MD  Chief Complaint:  Follow Up Visit   History of Present Illness:   Patient is seen again in the office followup status post coronary artery bypass grafting x2 and aortic valve replacement on 06/28/2014. She required open saphenous vein harvest and has had some difficulties with postoperative cellulitis. She states that she does have some nighttime sternal discomfort but otherwise is feeling pretty well.          Zubrod Score: At the time of surgery this patient's most appropriate activity status/level should be described as: []     0    Normal activity, no symptoms []     1    Restricted in physical strenuous activity but ambulatory, able to do out light work []     2    Ambulatory and capable of self care, unable to do work activities, up and about                 >50 % of waking hours                                                                                   []     3    Only limited self care, in bed greater than 50% of waking hours []     4    Completely disabled, no self care, confined to bed or chair []     5    Moribund  History  Smoking status  . Former Smoker -- 0.75 packs/day for 50 years  . Types: Cigarettes  . Quit date: 06/22/2014  Smokeless tobacco  . Never Used    Comment: 06/20/2014 "I've been using the e-cigarettes to try to get me off the regular cigarettes"       Allergies  Allergen Reactions  . Morphine Nausea And Vomiting    ONLY IN IV FORM    Current Outpatient Prescriptions  Medication Sig Dispense Refill  . amiodarone (PACERONE) 200 MG tablet Take 1 tablet (200 mg total) by mouth daily.  30 tablet  0  . aspirin EC 325 MG EC tablet Take 1 tablet (325 mg total) by mouth daily.   30 tablet  0  . atorvastatin (LIPITOR) 80 MG tablet Take 1 tablet (80 mg total) by mouth daily at 6 PM.  30 tablet  3  . levothyroxine (SYNTHROID, LEVOTHROID) 50 MCG tablet Take 1 tablet (50 mcg total) by mouth daily before breakfast.  30 tablet  3  . lisinopril (PRINIVIL,ZESTRIL) 20 MG tablet Take 20 mg by mouth daily.      . metoprolol tartrate (LOPRESSOR) 25 MG tablet Take 1 tablet (25 mg total) by mouth 2 (two) times daily.  60 tablet  3  . potassium chloride SA (K-DUR,KLOR-CON) 20 MEQ tablet Take 1 tablet (20 mEq  total) by mouth 2 (two) times daily.  7 tablet  0  . traMADol (ULTRAM) 50 MG tablet 1-2 tabs po q6h prn pain  30 tablet  0   No current facility-administered medications for this visit.       Physical Exam: BP 214/92  Pulse 53  Resp 20  Ht 5\' 4"  (1.626 m)  Wt 166 lb (75.297 kg)  BMI 28.48 kg/m2  SpO2 98%  General appearance: alert, cooperative and no distress Heart: regular rate and rhythm and 2/ 6 systolic murmur Lungs: clear to auscultation bilaterally Abdomen: Benign exam Extremities: Trace edema Wound: Incisions healing well without evidence of infection Wounds:  Diagnostic Studies & Laboratory data:         Recent Radiology Findings: No results found.    Recent Labs: Lab Results  Component Value Date   WBC 11.8* 07/19/2014   HGB 9.7* 07/19/2014   HCT 30.4* 07/19/2014   PLT 409.0* 07/19/2014   GLUCOSE 99 08/30/2014   CHOL 202* 08/27/2009   TRIG 298.0* 08/27/2009   HDL 33.90* 08/27/2009   LDLDIRECT 101.9 08/27/2009   ALT 16 05/24/2014   AST 28 05/24/2014   NA 138 08/30/2014   K 4.2 08/30/2014   CL 106 08/30/2014   CREATININE 1.3* 08/30/2014   BUN 13 08/30/2014   CO2 26 08/30/2014   TSH 9.810* 07/04/2014   INR 1.40 06/28/2014   HGBA1C 5.7* 06/27/2014      Assessment / Plan:  Patient continues to do well. She is not having any current anginal symptoms or shortness of breath. Her incisions have healed. She does have some sternal incision discomfort and I refilled  her Ultram #30. We will see again when necessary for any surgically related issues. She sees Dr. Rayann Heman for cardiology followup.          GOLD,[name] 09/02/2014 1:51 PM

## 2014-09-04 ENCOUNTER — Ambulatory Visit (HOSPITAL_COMMUNITY): Payer: Self-pay

## 2014-09-05 ENCOUNTER — Ambulatory Visit: Payer: Self-pay | Admitting: Cardiothoracic Surgery

## 2014-09-05 ENCOUNTER — Ambulatory Visit: Payer: Self-pay

## 2014-09-06 ENCOUNTER — Ambulatory Visit (INDEPENDENT_AMBULATORY_CARE_PROVIDER_SITE_OTHER): Payer: Medicare Other | Admitting: Physician Assistant

## 2014-09-06 ENCOUNTER — Ambulatory Visit (HOSPITAL_COMMUNITY): Payer: Self-pay

## 2014-09-06 ENCOUNTER — Encounter: Payer: Self-pay | Admitting: Physician Assistant

## 2014-09-06 VITALS — BP 150/86 | HR 45 | Ht 64.0 in | Wt 169.0 lb

## 2014-09-06 DIAGNOSIS — Z952 Presence of prosthetic heart valve: Secondary | ICD-10-CM

## 2014-09-06 DIAGNOSIS — Z954 Presence of other heart-valve replacement: Secondary | ICD-10-CM

## 2014-09-06 DIAGNOSIS — E7800 Pure hypercholesterolemia, unspecified: Secondary | ICD-10-CM

## 2014-09-06 DIAGNOSIS — I350 Nonrheumatic aortic (valve) stenosis: Secondary | ICD-10-CM

## 2014-09-06 DIAGNOSIS — I2510 Atherosclerotic heart disease of native coronary artery without angina pectoris: Secondary | ICD-10-CM

## 2014-09-06 DIAGNOSIS — I119 Hypertensive heart disease without heart failure: Secondary | ICD-10-CM

## 2014-09-06 DIAGNOSIS — I480 Paroxysmal atrial fibrillation: Secondary | ICD-10-CM

## 2014-09-06 DIAGNOSIS — I200 Unstable angina: Secondary | ICD-10-CM

## 2014-09-06 LAB — BASIC METABOLIC PANEL
BUN: 18 mg/dL (ref 6–23)
CO2: 27 mEq/L (ref 19–32)
Calcium: 8.9 mg/dL (ref 8.4–10.5)
Chloride: 102 mEq/L (ref 96–112)
Creatinine, Ser: 1.3 mg/dL — ABNORMAL HIGH (ref 0.4–1.2)
GFR: 43.63 mL/min — AB (ref >60.00)
Glucose, Bld: 125 mg/dL — ABNORMAL HIGH (ref 70–99)
Potassium: 3.9 mEq/L (ref 3.5–5.1)
SODIUM: 135 meq/L (ref 135–145)

## 2014-09-06 MED ORDER — ATORVASTATIN CALCIUM 80 MG PO TABS: 80.0000 mg | ORAL_TABLET | Freq: Every day | ORAL | Status: DC

## 2014-09-06 MED ORDER — ASPIRIN EC 81 MG PO TBEC: 81.0000 mg | DELAYED_RELEASE_TABLET | Freq: Every day | ORAL | Status: DC

## 2014-09-06 NOTE — Patient Instructions (Signed)
Please make the following medication changes: 1.  STOP taking Amiodarone. 2.  Decrease Aspirin to 81 mg daily.  Call back if your heart remains in the 40s on your oxygen meter (after you have been off the Amiodarone for ~ 2 weeks).  Labs today:  BMET.  Schedule fasting labs:  Lipids and LFTs (next 4 weeks).  Keep your follow up appointment with Dr. Thompson Grayer 10/25/14 at 3:45 pm

## 2014-09-06 NOTE — Progress Notes (Signed)
Cardiology Office Note   Date:  09/06/2014   ID:  Carrie [name], DOB [birth year], MRN [id number]  PCP:  Tammi Sou, MD  Cardiologist:  Dr. Thompson Grayer     History of Present Illness: Carrie [name] is a 63 y.o. female with a hx of AS, HTN, HL, tobacco abuse.  Recent evaluation for chest pain demonstrated severe AS and 2 v CAD.  She underwent CABG x 2 (L-LAD, S-RCA) and bioprosthetic AVR 06/28/14 with Dr. Servando Snare.  Post op course was c/b Citrobacter UTI and AFib with RVR.  She converted to NSR on Amiodarone.  She was also started on Synthroid for hypothyroidism.  She had some drainage from her SVG harvest site post DC and was placed on antibiotics.   I saw her 07/25/14.  She returns for FU.  She is doing well. She is walking daily. She is going to return to work at [employer] next week.  The patient denies chest pain, shortness of breath, syncope, orthopnea, PND or significant pedal edema.    Studies:  - LHC (7/15):  Ostial/prox 40%, mid LAD 60%, mid CFX 40%, ostial RCA 95% followed by prox RCA 95%, EF 70%, AV mean 45 mmHg  - Echo (7/15):  EF 60-65%, no RWMA, Gr 1 DD, severe AS (mean 43 mmHg), mild MR, mild LAE  - Echo (9/15):  EF 60-65%, no RWMA, mild MR, mild LAE.  - Carotid US (7/15):  R 40-59%; L 60-79%   Recent Labs/Images: 05/24/2014: ALT 16  07/04/2014: TSH 9.810*  07/19/2014: Hemoglobin 9.7*  08/30/2014: Creatinine 1.3*; Potassium 4.2    Wt Readings from Last 3 Encounters:  09/06/14 169 lb (76.658 kg)  09/02/14 166 lb (75.297 kg)  08/01/14 166 lb (75.297 kg)     Past Medical History  Diagnosis Date  . Hypertension   . Hyperlipidemia   . Carotid bruit   . Sinus bradycardia   . Tobacco dependence in remission   . DDD (degenerative disc disease), lumbar     [facility]  . Chronic lower back pain     [facility]  . Coronary artery disease     CABG 06/2014.  Normal LV function.  Marland Kitchen History of aortic valve replacement 06/2014    (pericardial tissue valve) for AS  .  Hypothyroidism 06/2014    started on levothyroxine while in hospital for her CABG  . Hx of echocardiogram 07/2014    Echo (9/15):  EF 60-65%, no RWMA, mild MR, mild LAE, AVR ok (mean 15 mmHg)    Current Outpatient Prescriptions  Medication Sig Dispense Refill  . aspirin 81 MG tablet Take 1 tablet (81 mg total) by mouth daily.      Marland Kitchen atorvastatin (LIPITOR) 80 MG tablet Take 1 tablet (80 mg total) by mouth daily at 6 PM.  90 tablet  3  . levothyroxine (SYNTHROID, LEVOTHROID) 50 MCG tablet Take 1 tablet (50 mcg total) by mouth daily before breakfast.  30 tablet  3  . lisinopril (PRINIVIL,ZESTRIL) 20 MG tablet Take 20 mg by mouth daily.      . metoprolol tartrate (LOPRESSOR) 25 MG tablet Take 1 tablet (25 mg total) by mouth 2 (two) times daily.  60 tablet  3  . traMADol (ULTRAM) 50 MG tablet 1-2 tabs po q6h prn pain  30 tablet  0   No current facility-administered medications for this visit.     Allergies:   Morphine   Social History:  The patient  reports that she  quit smoking about 2 months ago. Her smoking use included Cigarettes. She has a 37.5 pack-year smoking history. She has never used smokeless tobacco. She reports that she does not drink alcohol or use illicit drugs.   Family History:  The patient's family history includes Heart disease in her mother; Heart disease (age of onset: 51) in her father; Hyperlipidemia in her brother; Hypertension in her father and mother; Stroke in her mother. There is no history of Heart attack.   ROS:  Please see the history of present illness.   All other systems reviewed and negative.   PHYSICAL EXAM: VS:  BP 150/86  Pulse 45  Ht 5\' 4"  (1.626 m)  Wt 169 lb (76.658 kg)  BMI 28.99 kg/m2 Well nourished, well developed, in no acute distress HEENT: normal Neck: no JVD  Cardiac:  normal S1, S2; RRR; 1/6 systolic murmurat RUSB Lungs:  clear to auscultation bilaterally, no wheezing, rhonchi or rales Abd: soft, nontender, no hepatomegaly Ext:  No  edema  Skin: warm and dry Neuro:  CNs 2-12 intact, no focal abnormalities noted  EKG:   Sinus brady, HR 45, LAD, NSSTTW changes.      ASSESSMENT AND PLAN:  1. Coronary artery disease s/p CABG:  She continues to do well. Continue aspirin, statin, beta blocker. 2. Aortic stenosis S/P AVR (aortic valve replacement):  Recent echo with stable AVR.  Continue SBE prophylaxis.   3. Paroxysmal atrial fibrillation:  Maintaining sinus rhythm. She is now significantly bradycardic. She is asymptomatic with this. Discontinue amiodarone. She will contact us if her heart rates remain in the 40s. At that point, I would reduce her beta blocker dosage. 4. HYPERTENSION:   Borderline elevated. Continue to monitor. Continue current dose of beta blocker and ACE inhibitor.  Checked a BMET today. 5. HYPERCHOLESTEROLEMIA:   Continue statin.   Arrange fasting lipids and LFTs.  Disposition:  FU with Dr. Thompson Grayer 10/25/14.    Signed, Versie Starks, MHS 09/06/2014 10:12 AM    [facility] [address], [place], [place]  [address] Phone: [phone]; Fax: [phone]

## 2014-09-09 ENCOUNTER — Ambulatory Visit (HOSPITAL_COMMUNITY): Payer: Self-pay

## 2014-09-10 ENCOUNTER — Telehealth: Payer: Self-pay | Admitting: *Deleted

## 2014-09-10 DIAGNOSIS — G8918 Other acute postprocedural pain: Secondary | ICD-10-CM

## 2014-09-10 MED ORDER — TRAMADOL HCL 50 MG PO TABS: ORAL_TABLET | ORAL | Status: DC

## 2014-09-10 NOTE — Telephone Encounter (Signed)
Carrie [name] has called for a pain med refill, over 3 months s/p CABG. She states that her legs are still bothering her and she was given pain medication the last time she was here.  I told her it would be refilled today, but if further refills were needed she would have to see Dr. Servando Snare again.  She understood.

## 2014-09-11 ENCOUNTER — Ambulatory Visit (HOSPITAL_COMMUNITY): Payer: Self-pay

## 2014-09-13 ENCOUNTER — Ambulatory Visit (HOSPITAL_COMMUNITY): Payer: Self-pay

## 2014-09-16 ENCOUNTER — Ambulatory Visit (HOSPITAL_COMMUNITY): Payer: Self-pay

## 2014-09-18 ENCOUNTER — Ambulatory Visit (HOSPITAL_COMMUNITY): Payer: Self-pay

## 2014-09-20 ENCOUNTER — Ambulatory Visit (HOSPITAL_COMMUNITY): Payer: Self-pay

## 2014-09-21 ENCOUNTER — Telehealth: Payer: Self-pay

## 2014-09-23 ENCOUNTER — Ambulatory Visit (HOSPITAL_COMMUNITY): Payer: Self-pay

## 2014-09-25 ENCOUNTER — Ambulatory Visit (HOSPITAL_COMMUNITY): Payer: Self-pay

## 2014-09-26 NOTE — Telephone Encounter (Signed)
Dr Rayann Heman is out of the office until 10/07/14 and will not be able to address until then

## 2014-09-27 ENCOUNTER — Ambulatory Visit (HOSPITAL_COMMUNITY): Payer: Self-pay

## 2014-09-27 NOTE — Telephone Encounter (Signed)
Will ask our lipid clinic to advise and manage.

## 2014-09-30 ENCOUNTER — Ambulatory Visit (HOSPITAL_COMMUNITY): Payer: Self-pay

## 2014-10-02 ENCOUNTER — Ambulatory Visit (HOSPITAL_COMMUNITY): Payer: Self-pay

## 2014-10-04 ENCOUNTER — Ambulatory Visit (HOSPITAL_COMMUNITY): Payer: Self-pay

## 2014-10-07 ENCOUNTER — Ambulatory Visit (HOSPITAL_COMMUNITY): Payer: Self-pay

## 2014-10-09 ENCOUNTER — Ambulatory Visit (HOSPITAL_COMMUNITY): Payer: Self-pay

## 2014-10-11 ENCOUNTER — Ambulatory Visit (HOSPITAL_COMMUNITY): Payer: Self-pay

## 2014-10-14 ENCOUNTER — Ambulatory Visit (HOSPITAL_COMMUNITY): Payer: Self-pay

## 2014-10-14 NOTE — Telephone Encounter (Signed)
I have left message for pt for 3 weeks.  She has not returned any of the phone calls.  Last attempt at a message left today.  Will await pt call back at this time.

## 2014-10-16 ENCOUNTER — Ambulatory Visit (HOSPITAL_COMMUNITY): Payer: Self-pay

## 2014-10-18 ENCOUNTER — Ambulatory Visit (HOSPITAL_COMMUNITY): Payer: Self-pay

## 2014-10-21 ENCOUNTER — Ambulatory Visit (HOSPITAL_COMMUNITY): Payer: Self-pay

## 2014-10-21 ENCOUNTER — Other Ambulatory Visit: Payer: Self-pay | Admitting: Internal Medicine

## 2014-10-21 DIAGNOSIS — Z1231 Encounter for screening mammogram for malignant neoplasm of breast: Secondary | ICD-10-CM

## 2014-10-23 ENCOUNTER — Ambulatory Visit (HOSPITAL_COMMUNITY): Payer: Self-pay

## 2014-10-25 ENCOUNTER — Ambulatory Visit: Payer: Self-pay | Admitting: Internal Medicine

## 2014-10-25 ENCOUNTER — Ambulatory Visit (HOSPITAL_COMMUNITY): Payer: Self-pay

## 2014-10-28 ENCOUNTER — Ambulatory Visit (HOSPITAL_COMMUNITY): Payer: Self-pay

## 2014-10-30 ENCOUNTER — Ambulatory Visit (HOSPITAL_COMMUNITY): Payer: Self-pay

## 2014-10-31 ENCOUNTER — Encounter (HOSPITAL_COMMUNITY): Payer: Self-pay | Admitting: Cardiology

## 2014-11-01 ENCOUNTER — Ambulatory Visit (HOSPITAL_COMMUNITY): Payer: Self-pay

## 2014-11-04 ENCOUNTER — Ambulatory Visit (HOSPITAL_COMMUNITY): Payer: Self-pay

## 2014-11-04 ENCOUNTER — Ambulatory Visit: Payer: Self-pay

## 2014-11-06 ENCOUNTER — Ambulatory Visit (HOSPITAL_COMMUNITY): Payer: Self-pay

## 2014-11-08 ENCOUNTER — Ambulatory Visit (HOSPITAL_COMMUNITY): Payer: Self-pay

## 2014-11-11 ENCOUNTER — Ambulatory Visit (HOSPITAL_COMMUNITY): Payer: Self-pay

## 2014-11-13 ENCOUNTER — Ambulatory Visit (HOSPITAL_COMMUNITY): Payer: Self-pay

## 2014-11-15 ENCOUNTER — Ambulatory Visit (HOSPITAL_COMMUNITY): Payer: Self-pay

## 2014-11-18 ENCOUNTER — Ambulatory Visit (HOSPITAL_COMMUNITY): Payer: Self-pay

## 2014-11-20 ENCOUNTER — Ambulatory Visit (HOSPITAL_COMMUNITY): Payer: Self-pay

## 2014-11-22 ENCOUNTER — Ambulatory Visit (HOSPITAL_COMMUNITY): Payer: Self-pay

## 2014-11-25 ENCOUNTER — Ambulatory Visit (HOSPITAL_COMMUNITY): Payer: Self-pay

## 2014-11-27 ENCOUNTER — Ambulatory Visit (HOSPITAL_COMMUNITY): Payer: Self-pay

## 2014-11-29 ENCOUNTER — Ambulatory Visit (HOSPITAL_COMMUNITY): Payer: Self-pay

## 2014-12-02 ENCOUNTER — Ambulatory Visit (HOSPITAL_COMMUNITY): Payer: Self-pay

## 2014-12-04 ENCOUNTER — Ambulatory Visit (HOSPITAL_COMMUNITY): Payer: Self-pay

## 2014-12-06 ENCOUNTER — Ambulatory Visit (HOSPITAL_COMMUNITY): Payer: Self-pay

## 2014-12-10 ENCOUNTER — Encounter: Payer: Self-pay | Admitting: Internal Medicine

## 2014-12-18 ENCOUNTER — Ambulatory Visit: Payer: Self-pay | Admitting: Cardiology

## 2014-12-19 ENCOUNTER — Ambulatory Visit: Payer: Self-pay | Admitting: Internal Medicine

## 2015-01-17 ENCOUNTER — Ambulatory Visit (INDEPENDENT_AMBULATORY_CARE_PROVIDER_SITE_OTHER): Payer: Medicare Other | Admitting: Cardiology

## 2015-01-17 ENCOUNTER — Encounter: Payer: Self-pay | Admitting: Cardiology

## 2015-01-17 VITALS — BP 120/72 | HR 49 | Ht 64.0 in | Wt 165.0 lb

## 2015-01-17 DIAGNOSIS — I10 Essential (primary) hypertension: Secondary | ICD-10-CM

## 2015-01-17 DIAGNOSIS — Z952 Presence of prosthetic heart valve: Secondary | ICD-10-CM

## 2015-01-17 DIAGNOSIS — Z951 Presence of aortocoronary bypass graft: Secondary | ICD-10-CM

## 2015-01-17 DIAGNOSIS — E7800 Pure hypercholesterolemia, unspecified: Secondary | ICD-10-CM

## 2015-01-17 DIAGNOSIS — Z954 Presence of other heart-valve replacement: Secondary | ICD-10-CM

## 2015-01-17 MED ORDER — ATORVASTATIN CALCIUM 40 MG PO TABS: 40.0000 mg | ORAL_TABLET | Freq: Every day | ORAL | Status: DC

## 2015-01-17 NOTE — Patient Instructions (Signed)
**Note De-Identified Carrie [name] Obfuscation** Your physician has recommended you make the following change in your medication: start taking Atorvastatin 40 mg daily  Your physician recommends that you return for lab work on: February 10, 2015 (Please do not eat or drink after midnight the night before labs are drawn)  Your physician wants you to follow-up in: 6 months. You will receive a reminder letter in the mail two months in advance. If you don't receive a letter, please call our office to schedule the follow-up appointment.

## 2015-01-17 NOTE — Progress Notes (Signed)
Patient ID: Carrie [name], female   DOB: 05-15-1951, 63 y.o.   MRN: [id number]     Cardiology Office Note   Date:  01/17/2015   ID:  Carrie [name], DOB February 26, 1951, MRN [id number]  PCP:  Antonietta Jewel, MD  Cardiologist:  Dr. Thompson Grayer     History of Present Illness: Carrie [name] is a 64 y.o. female with a hx of AS, HTN, HL, tobacco abuse.  Recent evaluation for chest pain demonstrated severe AS and 2 v CAD.  She underwent CABG x 2 (L-LAD, S-RCA) and bioprosthetic AVR 06/28/14 with Dr. Servando Snare.  Post op course was c/b Citrobacter UTI and AFib with RVR.  She converted to NSR on Amiodarone.  She was also started on Synthroid for hypothyroidism.  She had some drainage from her SVG harvest site post DC and was placed on antibiotics.   I saw her 07/25/14.  She returns for FU.  She is doing well. She is walking daily. She is going to return to work at [employer] next week.  The patient denies chest pain, shortness of breath, syncope, orthopnea, PND or significant pedal edema.   01/17/2015 - the patient is coming after 4 months, she denies any chest pain other than pain in her surgical side. No exertional pain or shortness of breath. No palpitations, claudications or syncope. No orthopnea or proximal nocturnal dyspnea. She has been compliant to her medicines however her primary care physician instructed her to stop taking atorvastatin she doesn't know the reason.   Studies:  - LHC (7/15):  Ostial/prox 40%, mid LAD 60%, mid CFX 40%, ostial RCA 95% followed by prox RCA 95%, EF 70%, AV mean 45 mmHg  - Echo (7/15):  EF 60-65%, no RWMA, Gr 1 DD, severe AS (mean 43 mmHg), mild MR, mild LAE  - Echo (9/15):  EF 60-65%, no RWMA, mild MR, mild LAE.  - Carotid US (7/15):  R 40-59%; L 60-79%   Recent Labs/Images: 05/24/2014: ALT 16 07/04/2014: TSH 9.810* 07/19/2014: Hemoglobin 9.7* 09/06/2014: Creatinine 1.3*; Potassium 3.9   Wt Readings from Last 3 Encounters:  01/17/15 165 lb (74.844 kg)  09/06/14 169 lb  (76.658 kg)  09/02/14 166 lb (75.297 kg)     Past Medical History  Diagnosis Date  . Hypertension   . Hyperlipidemia   . Carotid bruit   . Sinus bradycardia   . Tobacco dependence in remission   . DDD (degenerative disc disease), lumbar     [facility]  . Chronic lower back pain     [facility]  . Coronary artery disease     CABG 06/2014.  Normal LV function.  Mar[facility] Kitchen History of aortic valve replacement 06/2014    (pericardial tissue valve) for AS  . Hypothyroidism 06/2014    started on levothyroxine while in hospital for her CABG  . Hx of echocardiogram 07/2014    Echo (9/15):  EF 60-65%, no RWMA, mild MR, mild LAE, AVR ok (mean 15 mmHg)    Current Outpatient Prescriptions  Medication Sig Dispense Refill  . levothyroxine (SYNTHROID, LEVOTHROID) 50 MCG tablet Take 1 tablet (50 mcg total) by mouth daily before breakfast. 30 tablet 3  . lisinopril (PRINIVIL,ZESTRIL) 20 MG tablet Take 20 mg by mouth daily.    . metoprolol tartrate (LOPRESSOR) 25 MG tablet Take 1 tablet (25 mg total) by mouth 2 (two) times daily. 60 tablet 3   No current facility-administered medications for this visit.     Allergies:   Morphine  Social History:  The patient  reports that she quit smoking about 6 months ago. Her smoking use included Cigarettes. She has a 37.5 pack-year smoking history. She has never used smokeless tobacco. She reports that she does not drink alcohol or use illicit drugs.   Family History:  The patient's family history includes Heart disease in her mother; Heart disease (age of onset: 45) in her father; Hyperlipidemia in her brother; Hypertension in her father and mother; Stroke in her mother. There is no history of Heart attack.   ROS:  Please see the history of present illness.   All other systems reviewed and negative.   PHYSICAL EXAM: VS:  BP 120/72 mmHg  Pulse 49  Ht 5\' 4"  (1.626 m)  Wt 165 lb (74.844 kg)  BMI 28.31 kg/m2  SpO2 99% Well nourished, well developed, in no acute  distress HEENT: normal Neck: no JVD  Cardiac:  normal S1, S2; RRR; 1/6 systolic murmurat RUSB Lungs:  clear to auscultation bilaterally, no wheezing, rhonchi or rales Abd: soft, nontender, no hepatomegaly Ext:  No edema  Skin: warm and dry Neuro:  CNs 2-12 intact, no focal abnormalities noted  EKG:   Sinus brady, HR 45, LAD, NSSTTW changes.     ECHO: 08/09/2014 Left ventricle: The cavity size was normal. Wall thickness was normal. Systolic function was normal. The estimated ejection fraction was in the range of 60% to 65%. Wall motion was normal; there were no regional wall motion abnormalities. - Aortic valve: Mildly calcified annulus. - Mitral valve: There was mild regurgitation. - Left atrium: The atrium was mildly dilated.   ASSESSMENT AND PLAN:  1. Coronary artery disease s/p CABG:  She continues to do well. Continue aspirin, statin, beta blocker. We will restart atorvastatin 40 mg daily. 2. Aortic stenosis S/P AVR (aortic valve replacement):  Recent echo with stable AVR.  Continue SBE prophylaxis.   3. Paroxysmal atrial fibrillation:  Maintaining sinus rhythm. She is now significantly bradycardic. She is asymptomatic with this. Discontinue amiodarone. Her heart rate is now improved in 50s since but the blocker dose was decreased. 4. HYPERTENSION:   Controlled  5. HYPERCHOLESTEROLEMIA:   We started atorvastatin 40 mg daily.  Arrange fasting lipids and LFTs in one month's. .  Follow up in 6 months.   Dorothy Spark 01/17/2015

## 2015-02-10 ENCOUNTER — Other Ambulatory Visit: Payer: Self-pay

## 2015-04-22 ENCOUNTER — Ambulatory Visit: Payer: Self-pay

## 2016-04-11 IMAGING — CT CT CHEST W/O CM
2 of 6 series · 15 of 36 positions shown, 18 images · non-contrast
Comparison: Chest radiographs 05/24/2014.

CLINICAL DATA: Pre-op for heart surgery. Evaluate aortic
calcification.

EXAM:
CT CHEST WITHOUT CONTRAST
TECHNIQUE: Multidetector CT imaging of the chest was performed following the
standard protocol without IV contrast..

[Series 2: thorax 5.0 i31f 1 · axial · 0.69mm/px · z∈[-314,-44]mm · 12 of 64 slices shown, 15 images]
[im 5/64  mediastinal]
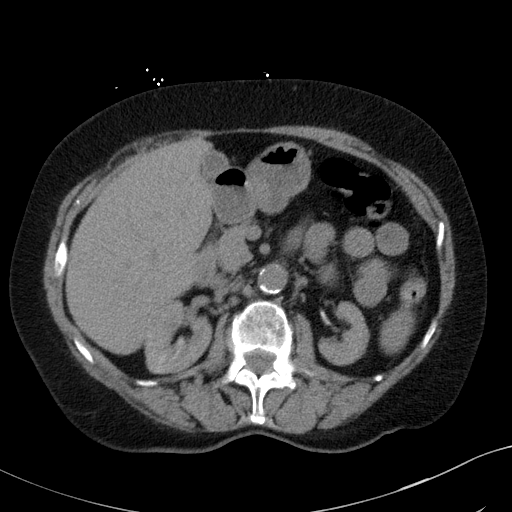
[im 5/64  lung]
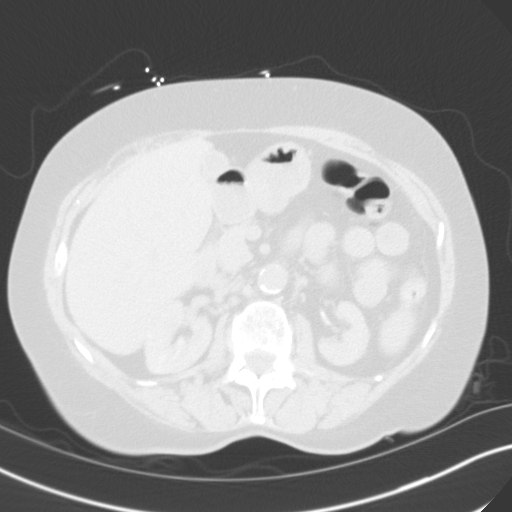
[im 10/64  lung]
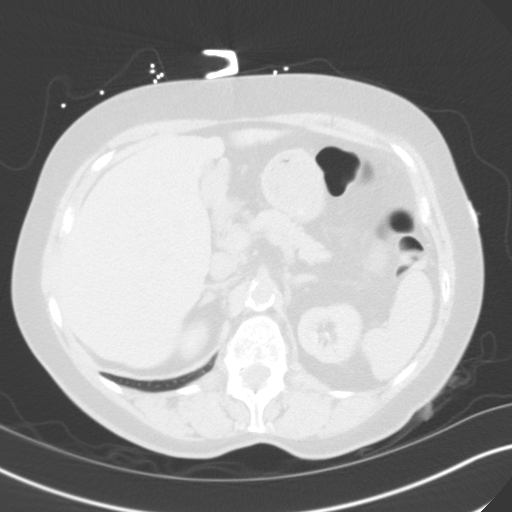
[im 15/64  lung]
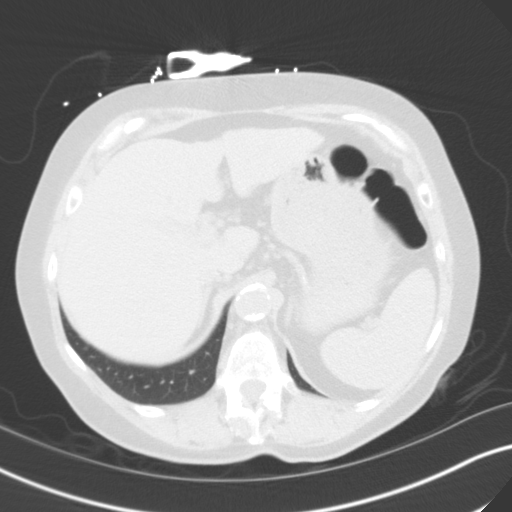
[im 20/64  lung]
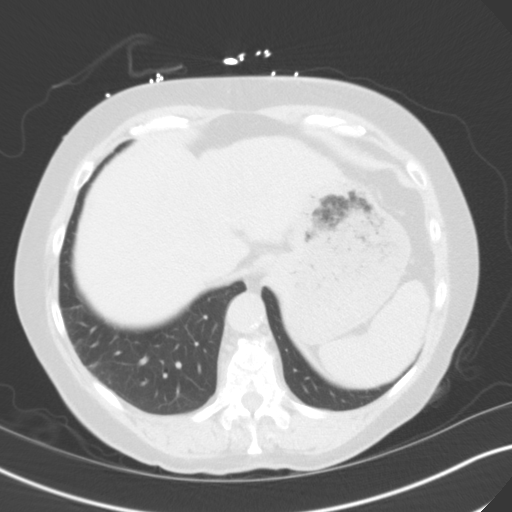
[im 25/64  mediastinal]
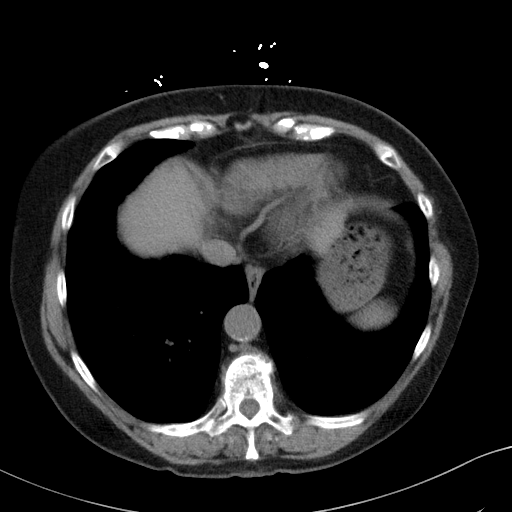
[im 25/64  lung]
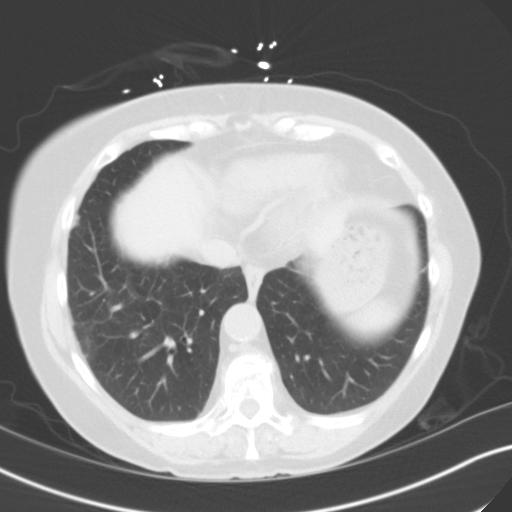
[im 30/64  lung]
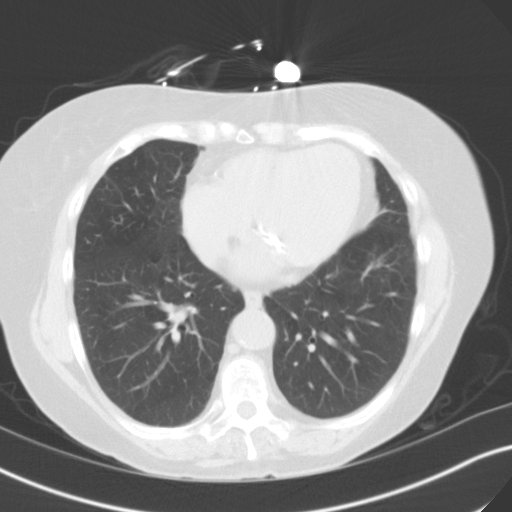
[im 34/64  lung]
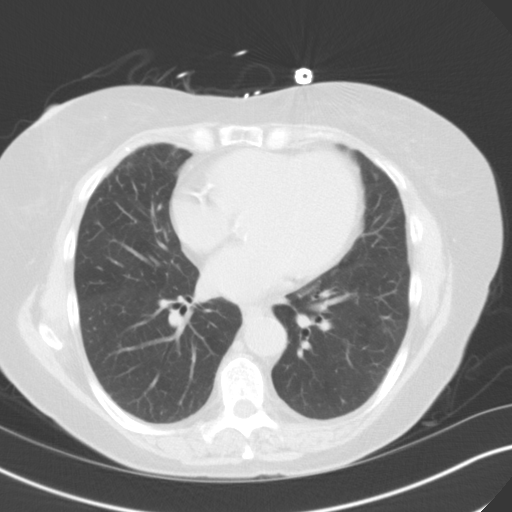
[im 39/64  lung]
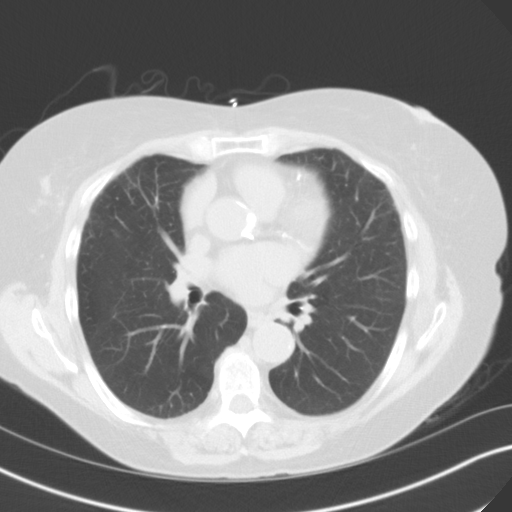
[im 44/64  mediastinal]
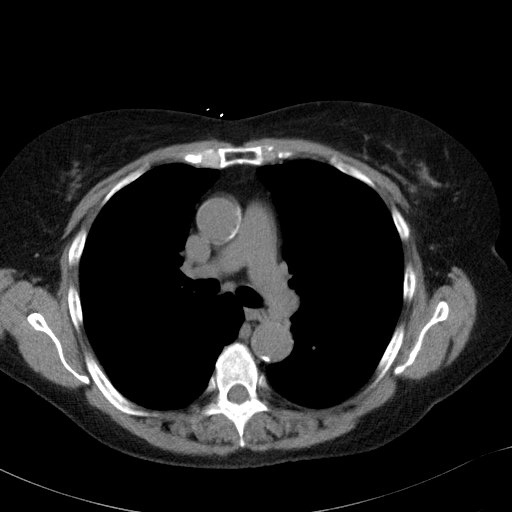
[im 44/64  lung]
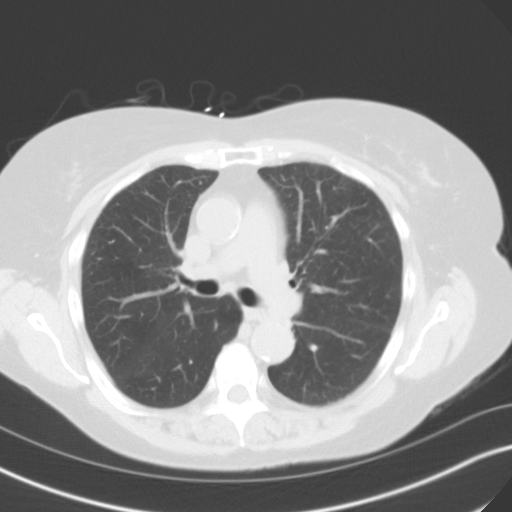
[im 49/64  lung]
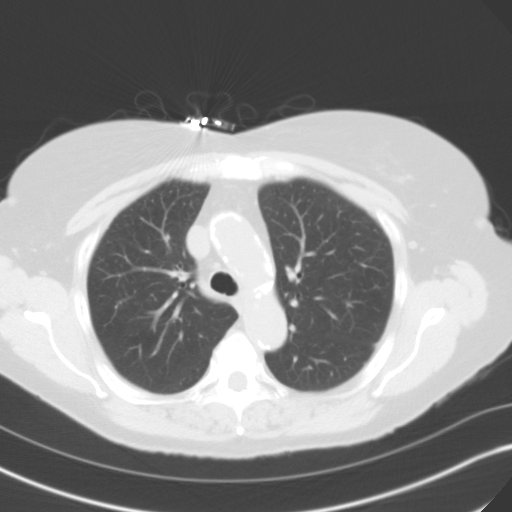
[im 54/64  lung]
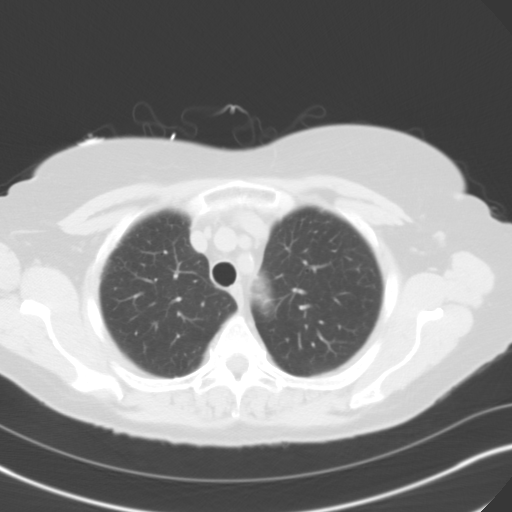
[im 59/64  lung]
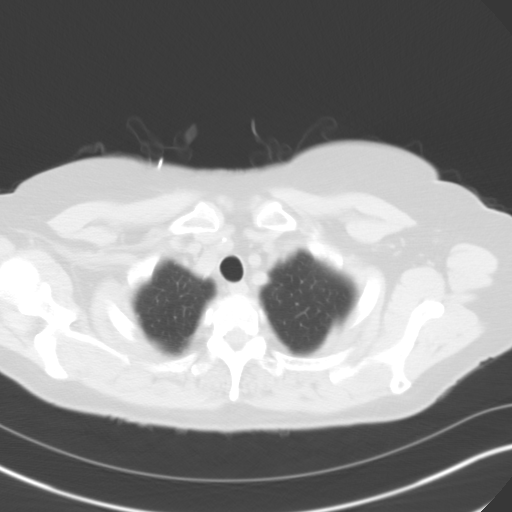

[Series 5: coronal · coronal · 0.68mm/px · 3 of 83 slices shown]
[im 17/83  lung]
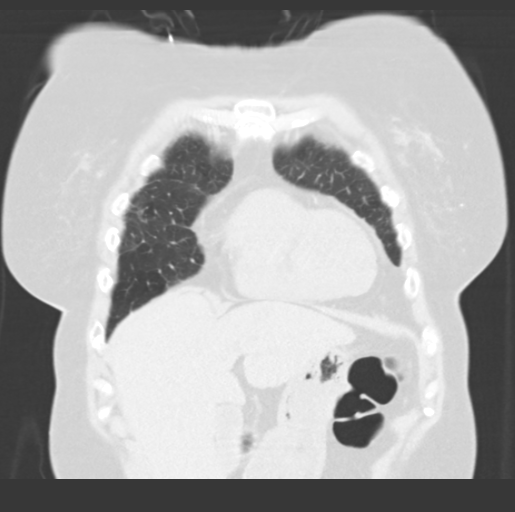
[im 33/83  lung]
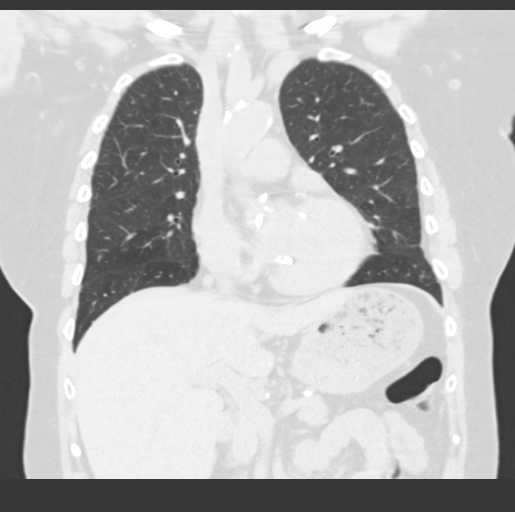
[im 50/83  lung]
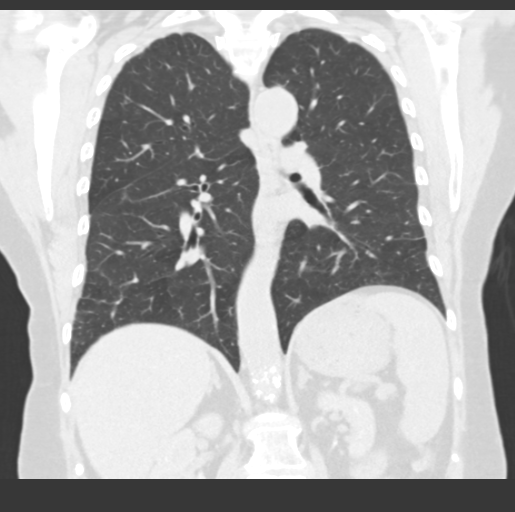

[15 of 36 positions shown; findings below may reference images not displayed]

FINDINGS: Mediastinum: There are no enlarged mediastinal, hilar or axillary
lymph nodes. The thyroid gland, trachea and esophagus appear normal.
The heart size is normal. There is moderate diffuse atherosclerosis
of the aorta, great vessels and coronary arteries. There is dense
calcification of the aortic valve which appears tricuspid. Mitral
annular calcifications are noted.There is no significant dilatation
of the aortic lumen.

Lungs/Pleura: There is no pleural or pericardial effusion.Mild
emphysematous changes are noted. There is no suspicious pulmonary
nodule or confluent airspace opacity.

Upper abdomen: The liver demonstrates mild contour irregularity in
the left lobe. No focal abnormality identified. The adrenal glands
appear normal.

Musculoskeletal/Chest wall: No chest wall abnormality or suspicious
osseous lesions. There are degenerative changes throughout the
spine.
IMPRESSION: 1. Moderate diffuse atherosclerosis.
2. Dense calcification of a tricuspid aortic valve consistent with
aortic stenosis.
3. Mild emphysema.
4. No acute findings demonstrated.

## 2016-05-10 ENCOUNTER — Other Ambulatory Visit: Payer: Self-pay | Admitting: Nephrology

## 2016-05-10 DIAGNOSIS — N183 Chronic kidney disease, stage 3 (moderate): Secondary | ICD-10-CM

## 2016-06-08 ENCOUNTER — Other Ambulatory Visit: Payer: Self-pay

## 2016-06-22 ENCOUNTER — Ambulatory Visit
Admission: RE | Admit: 2016-06-22 | Discharge: 2016-06-22 | Disposition: A | Discharge status: Home / Self Care | Payer: Medicare HMO | Source: Ambulatory Visit | Attending: Nephrology | Admitting: Nephrology

## 2016-06-22 DIAGNOSIS — N183 Chronic kidney disease, stage 3 (moderate): Secondary | ICD-10-CM

## 2017-05-18 ENCOUNTER — Other Ambulatory Visit: Payer: Self-pay | Admitting: Internal Medicine

## 2017-05-18 DIAGNOSIS — Z1231 Encounter for screening mammogram for malignant neoplasm of breast: Secondary | ICD-10-CM

## 2017-07-07 NOTE — Progress Notes (Signed)
Please place orders in EPIC as patient is being scheduled for a pre-op appointment! Thank you! 

## 2017-07-22 ENCOUNTER — Encounter (HOSPITAL_COMMUNITY): Admission: RE | Admit: 2017-07-22 | Payer: Medicare HMO | Source: Ambulatory Visit

## 2017-07-27 ENCOUNTER — Other Ambulatory Visit (HOSPITAL_COMMUNITY): Payer: Medicare HMO

## 2017-07-27 ENCOUNTER — Encounter (HOSPITAL_COMMUNITY): Payer: Self-pay

## 2017-07-27 NOTE — H&P (Signed)
TOTAL HIP REVISION ADMISSION H&P  Patient is admitted for conversion of previous hip surgery to  left total hip arthroplasty.  Subjective:  Chief Complaint:   Left hip OA status post percutaneous screw fixation  HPI: Carrie [name], 66 y.o. female, has a history of pain and functional disability in the left hip due to arthritis and fracture and patient has failed non-surgical conservative treatments for greater than 12 weeks to include NSAID's and/or analgesics, use of assistive devices and activity modification. The indications for the revision total hip arthroplasty are progressive OA.  Onset of symptoms was gradual starting 1+ years ago with gradually worsening course since that time.  Prior procedures on the left hip include percutaneous screw fixation.  Patient currently rates pain in the left hip at 10 out of 10 with activity.  There is night pain, worsening of pain with activity and weight bearing, trendelenberg gait, pain that interfers with activities of daily living and pain with passive range of motion. Patient has evidence of progressive OA changes by imaging studies.  This condition presents safety issues increasing the risk of falls.  There is no current active infection.  Risks, benefits and expectations were discussed with the patient.  Risks including but not limited to the risk of anesthesia, blood clots, nerve damage, blood vessel damage, failure of the prosthesis, infection and up to and including death.  Patient understand the risks, benefits and expectations and wishes to proceed with surgery.   PCP: Antonietta Jewel, MD  D/C Plans:       Home  Post-op Meds:       No Rx given   Tranexamic Acid:      To be given - IV  Decadron:      Is to be given  FYI:     ASA  Oxycodone (on prior to surgery 10/325 tid)  DME:   Rx given for - RW   PT:    No PT    Patient Active Problem List   Diagnosis Date Noted  . Atrial fibrillation ([place]) 07/25/2014  . Surgical wound infection  07/21/2014  . Postoperative anemia due to acute blood loss 07/21/2014  . Elevated serum creatinine 07/21/2014  . Chronic renal insufficiency 07/21/2014  . Lower extremity edema 07/11/2014  . Preventative health care 07/11/2014  . Hypothyroid 07/04/2014  . 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 S/P AVR (aortic valve replacement) 06/28/2014  . CAD (coronary artery disease) 06/24/2014  . Aortic stenosis 06/24/2014  . Unstable angina ([place]) 06/16/2014  . Hypertensive cardiovascular disease 06/16/2014  . Bursitis of left shoulder 07/28/2011  . CANDIDIASIS, SKIN 06/18/2010  . PITYRIASIS [place] 02/12/2010  . OSTEOARTHRITIS, HIP 02/12/2010  . DERMATOMYCOSIS 09/03/2009  . HYPERCHOLESTEROLEMIA 07/14/2009  . BRADYCARDIA 07/11/2009  . PALPITATIONS 07/11/2009  . CAROTID BRUIT 07/11/2009  . NECK PAIN 07/10/2009  . HYPERTENSION 02/20/2009  . ARTHRITIS, LEFT SACROILIAC JOINT 02/20/2009  . ARTHRITIS, BACK 02/20/2009  . SCIATICA, ACUTE 02/20/2009   Past Medical History:  Diagnosis Date  . Carotid bruit   . Chronic lower back pain    [facility]  . Coronary artery disease    CABG 06/2014.  Normal LV function.  . DDD (degenerative disc disease), lumbar    [facility]  . History of aortic valve replacement 06/2014   (pericardial tissue valve) for AS  . Hx of echocardiogram 07/2014   Echo (9/15):  EF 60-65%, no RWMA, mild MR, mild LAE, AVR ok (mean 15 mmHg)  . Hyperlipidemia   .  Hypertension   . Hypothyroidism 06/2014   started on levothyroxine while in hospital for her CABG  . Sinus bradycardia   . Tobacco dependence in remission     Past Surgical History:  Procedure Laterality Date  . ABDOMINAL HYSTERECTOMY  1970's  . AORTIC VALVE REPLACEMENT [name] 06/28/2014   Procedure: Aortic valve replacement using [facility] Ease 55mm valve Serial number [id number];  Surgeon: Grace Isaac, MD;  Location: [place];  Service: Open Heart Surgery;  Laterality: [name];  . CARDIAC CATHETERIZATION  06/20/2014  . CATARACT  EXTRACTION W/ INTRAOCULAR LENS  IMPLANT, BILATERAL Bilateral ~ 02/2014  . CORONARY ARTERY BYPASS GRAFT [name] 06/28/2014   Procedure: Coronary artery bypass grafting on pump using left internal mammary artery to left anterior descending artery, right greater saphenous vein to right coronary artery.;  Surgeon: Grace Isaac, MD;  Location: [place];  Service: Open Heart Surgery;  Laterality: [name];  . HEEL SPUR SURGERY Right   . HIP ORIF W/ CAPSULOTOMY Left 1980's  . INTRAOPERATIVE TRANSESOPHAGEAL ECHOCARDIOGRAM [name] 06/28/2014   Procedure: INTRAOPERATIVE TRANSESOPHAGEAL ECHOCARDIOGRAM;  Surgeon: Grace Isaac, MD;  Location: [place];  Service: Open Heart Surgery;  Laterality: [name];  . LEFT HEART CATHETERIZATION WITH CORONARY ANGIOGRAM [name] 06/20/2014   Procedure: LEFT HEART CATHETERIZATION WITH CORONARY ANGIOGRAM;  Surgeon: Peter M Martinique, MD;  [facility] C[facility] LAB;  Service: Cardiovascular;  Laterality: [name];  . MULTIPLE EXTRACTIONS WITH ALVEOLOPLASTY [name] 06/25/2014   Procedure: Extraction of tooth #'s 2,3,4,14, 18, 19 with alveoloplasty and gross debridement of remaining teeth;  Surgeon: Lenn Cal, DDS;  Location: [place];  Service: Oral Surgery;  Laterality: [name];  . TONSILLECTOMY  1978    No prescriptions prior to admission.   Allergies  Allergen Reactions  . Morphine Nausea And Vomiting    ONLY IN IV FORM    Social History  Substance Use Topics  . Smoking status: Former Smoker    Packs/day: 0.75    Years: 50.00    Types: Cigarettes    Quit date: 06/22/2014  . Smokeless tobacco: Never Used     Comment: 06/20/2014 "I've been using the e-cigarettes to try to get me off the regular cigarettes"  . Alcohol use No    Family History  Problem Relation Age of Onset  . Hypertension Mother   . Heart disease Mother        CAD; needs pacemaker75  . Stroke Mother   . Hypertension Father   . Heart disease Father 27       pacemaker  . Hyperlipidemia Brother   . Heart attack Neg Hx       Review  of Systems  Constitutional: Negative.   HENT: Negative.   Eyes: Negative.   Respiratory: Negative.   Cardiovascular: Negative.   Gastrointestinal: Negative.   Genitourinary: Negative.   Musculoskeletal: Positive for back pain and joint pain.  Skin: Negative.   Neurological: Negative.   Endo/Heme/Allergies: Negative.   Psychiatric/Behavioral: Negative.     Objective:  Physical Exam  Constitutional: She is oriented to person, place, and time. She appears well-developed.  HENT:  Head: Normocephalic.  Eyes: Pupils are equal, round, and reactive to light.  Neck: Neck supple. No JVD present. No tracheal deviation present. No thyromegaly present.  Cardiovascular: Normal rate, regular rhythm and intact distal pulses.   Murmur heard. Respiratory: Effort normal and breath sounds normal. No respiratory distress. She has no wheezes.  GI: Soft. There is no tenderness. There is no guarding.  Musculoskeletal:  Left hip: She exhibits decreased range of motion, decreased strength, tenderness, bony tenderness and laceration (healed previous incision). She exhibits no swelling and no deformity.  Lymphadenopathy:    She has no cervical adenopathy.  Neurological: She is alert and oriented to person, place, and time. 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  Skin: Skin is warm and dry.  Psychiatric: She has a normal mood and affect.      Labs: Estimated body mass index is 28.32 kg/m as calculated from the following:   Height as of 01/17/15: 5\' 4"  (1.626 m).   Weight as of 01/17/15: 74.8 kg (165 lb).  Imaging Review:  Plain radiographs demonstrate severe degenerative joint disease of the left hip(s). There is evidence of joint space narrowing and osteophytes. The bone quality appears to be good for age and reported activity level. There is percutaneous screws in place.  Assessment/Plan:  End stage arthritis, left hip(s) with failed previous hip  surgery.  The patient history, physical examination, clinical judgement of the provider and imaging studies are consistent with end stage degenerative joint disease of the left hip(s), previous hip screw fixation. Conversion to a total hip arthroplasty is deemed medically necessary. The treatment options including medical management, injection therapy, arthroscopy and arthroplasty were discussed at length. The risks and benefits of total hip arthroplasty were presented and reviewed. The risks due to aseptic loosening, infection, stiffness, dislocation/subluxation,  thromboembolic complications and other imponderables were discussed.  The patient acknowledged the explanation, agreed to proceed with the plan and consent was signed. Patient is being admitted for inpatient treatment for surgery, pain control, PT, OT, prophylactic antibiotics, VTE prophylaxis, progressive ambulation and ADL's and discharge planning. The patient is planning to be discharged home.    West Pugh Mattew Chriswell   PA-C  07/27/2017, 1:25 PM

## 2017-07-28 ENCOUNTER — Encounter (HOSPITAL_COMMUNITY): Payer: Self-pay

## 2017-07-28 ENCOUNTER — Encounter (HOSPITAL_COMMUNITY)
Admission: RE | Admit: 2017-07-28 | Discharge: 2017-07-28 | Disposition: A | Discharge status: Home / Self Care | Payer: Medicare HMO | Source: Ambulatory Visit | Attending: Orthopedic Surgery | Admitting: Orthopedic Surgery

## 2017-07-28 DIAGNOSIS — Z01812 Encounter for preprocedural laboratory examination: Secondary | ICD-10-CM | POA: Insufficient documentation

## 2017-07-28 DIAGNOSIS — Z01810 Encounter for preprocedural cardiovascular examination: Secondary | ICD-10-CM | POA: Insufficient documentation

## 2017-07-28 HISTORY — DX: Cardiac arrhythmia, unspecified: I49.9

## 2017-07-28 HISTORY — DX: Acute myocardial infarction, unspecified: I21.9

## 2017-07-28 LAB — BASIC METABOLIC PANEL
Anion gap: 8 (ref 5–15)
BUN: 18 mg/dL (ref 6–20)
CALCIUM: 8.9 mg/dL (ref 8.9–10.3)
CO2: 30 mmol/L (ref 22–32)
CREATININE: 1.29 mg/dL — AB (ref 0.44–1.00)
Chloride: 102 mmol/L (ref 101–111)
GFR calc Af Amer: 48 mL/min — ABNORMAL LOW (ref >60)
GFR, EST NON AFRICAN AMERICAN: 42 mL/min — AB (ref >60)
Glucose, Bld: 103 mg/dL — ABNORMAL HIGH (ref 65–99)
POTASSIUM: 5.2 mmol/L — AB (ref 3.5–5.1)
SODIUM: 140 mmol/L (ref 135–145)

## 2017-07-28 LAB — CBC
HEMATOCRIT: 37.2 % (ref 36.0–46.0)
Hemoglobin: 12.2 g/dL (ref 12.0–15.0)
MCH: 30 pg (ref 26.0–34.0)
MCHC: 32.8 g/dL (ref 30.0–36.0)
MCV: 91.6 fL (ref 78.0–100.0)
PLATELETS: 194 10*3/uL (ref 150–400)
RBC: 4.06 MIL/uL (ref 3.87–5.11)
RDW: 14 % (ref 11.5–15.5)
WBC: 10 10*3/uL (ref 4.0–10.5)

## 2017-07-28 LAB — SURGICAL PCR SCREEN
MRSA, PCR: NEGATIVE
STAPHYLOCOCCUS AUREUS: NEGATIVE

## 2017-07-28 LAB — ABO/RH: ABO/RH(D): A POS

## 2017-07-28 NOTE — Progress Notes (Signed)
Per Dr. Linna Caprice anesthesia pt. Needs a cardiac eval before she can proceed with surgery on 08/01/17. LVM on Velvet Mcbride's vm because SherryWills vm staed she was out of town. Thank you.

## 2017-07-28 NOTE — Patient Instructions (Signed)
Carrie [name]  07/28/2017   Your procedure is scheduled on: 08/01/17  Report to [facility] Main  Entrance   Report to admitting at     [place] AM   Call this number if you have problems the morning of surgery  [phone]   Remember: ONLY 1 PERSON MAY GO WITH YOU TO SHORT STAY TO GET  READY MORNING OF YOUR SURGERY.  Do not eat food or drink liquids :After Midnight.     Take these medicines the morning of surgery with A SIP OF WATER: atorvastatin( lipitor), metoprolol( Toprol-xl), Hydralazine( apresoline), levothyroxine(synthroid)              You may not have any metal on your body including hair pins and              piercings  Do not wear jewelry, make-up, lotions, powders or perfumes, deodorant             Do not wear nail polish.  Do not shave  48 hours prior to surgery.              Do not bring valuables to the hospital. [place].  Contacts, dentures or bridgework may not be worn into surgery.  Leave suitcase in the car. After surgery it may be brought to your room.                  Please read over the following fact sheets you were given: _____________________________________________________________________           [facility] - Preparing for Surgery Before surgery, you can play an important role.  Because skin is not sterile, your skin needs to be as free of germs as possible.  You can reduce the number of germs on your skin by washing with CHG (chlorahexidine gluconate) soap before surgery.  CHG is an antiseptic cleaner which kills germs and bonds with the skin to continue killing germs even after washing. Please DO NOT use if you have an allergy to CHG or antibacterial soaps.  If your skin becomes reddened/irritated stop using the CHG and inform your nurse when you arrive at Short Stay. Do not shave (including legs and underarms) for at least 48 hours prior to the first CHG shower.  You may shave  your face/neck. Please follow these instructions carefully:  1.  Shower with CHG Soap the night before surgery and the  morning of Surgery.  2.  If you choose to wash your hair, wash your hair first as usual with your  normal  shampoo.  3.  After you shampoo, rinse your hair and body thoroughly to remove the  shampoo.                           4.  Use CHG as you would any other liquid soap.  You can apply chg directly  to the skin and wash                       Gently with a scrungie or clean washcloth.  5.  Apply the CHG Soap to your body ONLY FROM THE NECK DOWN.   Do not use on face/ open  Wound or open sores. Avoid contact with eyes, ears mouth and genitals (private parts).                       Wash face,  Genitals (private parts) with your normal soap.             6.  Wash thoroughly, paying special attention to the area where your surgery  will be performed.  7.  Thoroughly rinse your body with warm water from the neck down.  8.  DO NOT shower/wash with your normal soap after using and rinsing off  the CHG Soap.                9.  Pat yourself dry with a clean towel.            10.  Wear clean pajamas.            11.  Place clean sheets on your bed the night of your first shower and do not  sleep with pets. Day of Surgery : Do not apply any lotions/deodorants the morning of surgery.  Please wear clean clothes to the hospital/surgery center.  FAILURE TO FOLLOW THESE INSTRUCTIONS MAY RESULT IN THE CANCELLATION OF YOUR SURGERY PATIENT SIGNATURE_________________________________  NURSE SIGNATURE__________________________________  ________________________________________________________________________  WHAT IS A BLOOD TRANSFUSION? Blood Transfusion Information  A transfusion is the replacement of blood or some of its parts. Blood is made up of multiple cells which provide different functions.  Red blood cells carry oxygen and are used for blood loss  replacement.  White blood cells fight against infection.  Platelets control bleeding.  Plasma helps clot blood.  Other blood products are available for specialized needs, such as hemophilia or other clotting disorders. BEFORE THE TRANSFUSION  Who gives blood for transfusions?   Healthy volunteers who are fully evaluated to make sure their blood is safe. This is blood bank blood. Transfusion therapy is the safest it has ever been in the practice of medicine. Before blood is taken from a donor, a complete history is taken to make sure that person has no history of diseases nor engages in risky social behavior (examples are intravenous drug use or sexual activity with multiple partners). The donor's travel history is screened to minimize risk of transmitting infections, such as malaria. The donated blood is tested for signs of infectious diseases, such as HIV and hepatitis. The blood is then tested to be sure it is compatible with you in order to minimize the chance of a transfusion reaction. If you or a relative donates blood, this is often done in anticipation of surgery and is not appropriate for emergency situations. It takes many days to process the donated blood. RISKS AND COMPLICATIONS Although transfusion therapy is very safe and saves many lives, the main dangers of transfusion include:   Getting an infectious disease.  Developing a transfusion reaction. This is an allergic reaction to something in the blood you were given. Every precaution is taken to prevent this. The decision to have a blood transfusion has been considered carefully by your caregiver before blood is given. Blood is not given unless the benefits outweigh the risks. AFTER THE TRANSFUSION  Right after receiving a blood transfusion, you will usually feel much better and more energetic. This is especially true if your red blood cells have gotten low (anemic). The transfusion raises the level of the red blood cells which  carry oxygen, and this usually causes an energy increase.  The nurse administering the transfusion will monitor you carefully for complications. HOME CARE INSTRUCTIONS  No special instructions are needed after a transfusion. You may find your energy is better. Speak with your caregiver about any limitations on activity for underlying diseases you may have. SEEK MEDICAL CARE IF:   Your condition is not improving after your transfusion.  You develop redness or irritation at the intravenous (IV) site. SEEK IMMEDIATE MEDICAL CARE IF:  Any of the following symptoms occur over the next 12 hours:  Shaking chills.  You have a temperature by mouth above 102 F (38.9 C), not controlled by medicine.  Chest, back, or muscle pain.  People around you feel you are not acting correctly or are confused.  Shortness of breath or difficulty breathing.  Dizziness and fainting.  You get a rash or develop hives.  You have a decrease in urine output.  Your urine turns a dark color or changes to pink, red, or brown. Any of the following symptoms occur over the next 10 days:  You have a temperature by mouth above 102 F (38.9 C), not controlled by medicine.  Shortness of breath.  Weakness after normal activity.  The white part of the eye turns yellow (jaundice).  You have a decrease in the amount of urine or are urinating less often.  Your urine turns a dark color or changes to pink, red, or brown. Document Released: 11/05/2000 Document Revised: 01/31/2012 Document Reviewed: 06/24/2008 Exit[employer] Patient Information 2014 [place].  _______________________________________________________________________  Incentive Spirometer  An incentive spirometer is a tool that can help keep your lungs clear and active. This tool measures how well you are filling your lungs with each breath. Taking long deep breaths may help reverse or decrease the chance of developing breathing (pulmonary) problems  (especially infection) following:  A long period of time when you are unable to move or be active. BEFORE THE PROCEDURE   If the spirometer includes an indicator to show your best effort, your nurse or respiratory therapist will set it to a desired goal.  If possible, sit up straight or lean slightly forward. Try not to slouch.  Hold the incentive spirometer in an upright position. INSTRUCTIONS FOR USE  1. Sit on the edge of your bed if possible, or sit up as far as you can in bed or on a chair. 2. Hold the incentive spirometer in an upright position. 3. Breathe out normally. 4. Place the mouthpiece in your mouth and seal your lips tightly around it. 5. Breathe in slowly and as deeply as possible, raising the piston or the ball toward the top of the column. 6. Hold your breath for 3-5 seconds or for as long as possible. Allow the piston or ball to fall to the bottom of the column. 7. Remove the mouthpiece from your mouth and breathe out normally. 8. Rest for a few seconds and repeat Steps 1 through 7 at least 10 times every 1-2 hours when you are awake. Take your time and take a few normal breaths between deep breaths. 9. The spirometer may include an indicator to show your best effort. Use the indicator as a goal to work toward during each repetition. 10. After each set of 10 deep breaths, practice coughing to be sure your lungs are clear. If you have an incision (the cut made at the time of surgery), support your incision when coughing by placing a pillow or rolled up towels firmly against it. Once you are able to get  out of bed, walk around indoors and cough well. You may stop using the incentive spirometer when instructed by your caregiver.  RISKS AND COMPLICATIONS  Take your time so you do not get dizzy or light-headed.  If you are in pain, you may need to take or ask for pain medication before doing incentive spirometry. It is harder to take a deep breath if you are having  pain. AFTER USE  Rest and breathe slowly and easily.  It can be helpful to keep track of a log of your progress. Your caregiver can provide you with a simple table to help with this. If you are using the spirometer at home, follow these instructions: [place] IF:   You are having difficultly using the spirometer.  You have trouble using the spirometer as often as instructed.  Your pain medication is not giving enough relief while using the spirometer.  You develop fever of 100.5 F (38.1 C) or higher. SEEK IMMEDIATE MEDICAL CARE IF:   You cough up bloody sputum that had not been present before.  You develop fever of 102 F (38.9 C) or greater.  You develop worsening pain at or near the incision site. MAKE SURE YOU:   Understand these instructions.  Will watch your condition.  Will get help right away if you are not doing well or get worse. Document Released: 03/21/2007 Document Revised: 01/31/2012 Document Reviewed: 05/22/2007 [facility] Patient Information 2014 [place], [place].   ________________________________________________________________________

## 2017-07-29 NOTE — Progress Notes (Signed)
LVM for [employer] that pt. Needs a cardiac clearance per Dr. Linna Caprice in anesthesia

## 2017-08-01 ENCOUNTER — Inpatient Hospital Stay (HOSPITAL_COMMUNITY): Admission: RE | Admit: 2017-08-01 | Payer: Medicare HMO | Source: Ambulatory Visit | Admitting: Orthopedic Surgery

## 2017-08-01 ENCOUNTER — Encounter (HOSPITAL_COMMUNITY): Admission: RE | Payer: Self-pay | Source: Ambulatory Visit

## 2017-08-01 LAB — TYPE AND SCREEN
ABO/RH(D): A POS
Antibody Screen: NEGATIVE

## 2017-08-01 SURGERY — CONVERSION, PREVIOUS HIP SURGERY, TO TOTAL HIP ARTHROPLASTY
Anesthesia: Spinal | Laterality: Left

## 2017-08-22 ENCOUNTER — Encounter: Payer: Self-pay | Admitting: Physician Assistant

## 2017-08-22 ENCOUNTER — Ambulatory Visit (INDEPENDENT_AMBULATORY_CARE_PROVIDER_SITE_OTHER): Payer: Medicare HMO | Admitting: Physician Assistant

## 2017-08-22 VITALS — BP 170/90 | HR 53 | Ht 66.0 in | Wt 181.0 lb

## 2017-08-22 DIAGNOSIS — I10 Essential (primary) hypertension: Secondary | ICD-10-CM

## 2017-08-22 DIAGNOSIS — Z952 Presence of prosthetic heart valve: Secondary | ICD-10-CM

## 2017-08-22 DIAGNOSIS — Z951 Presence of aortocoronary bypass graft: Secondary | ICD-10-CM

## 2017-08-22 DIAGNOSIS — E7800 Pure hypercholesterolemia, unspecified: Secondary | ICD-10-CM | POA: Diagnosis not present

## 2017-08-22 DIAGNOSIS — N183 Chronic kidney disease, stage 3 unspecified: Secondary | ICD-10-CM

## 2017-08-22 DIAGNOSIS — R0989 Other specified symptoms and signs involving the circulatory and respiratory systems: Secondary | ICD-10-CM | POA: Diagnosis not present

## 2017-08-22 DIAGNOSIS — Z01818 Encounter for other preprocedural examination: Secondary | ICD-10-CM | POA: Insufficient documentation

## 2017-08-22 MED ORDER — AMLODIPINE BESYLATE 5 MG PO TABS: 5.0000 mg | ORAL_TABLET | Freq: Every day | ORAL | 11 refills | Status: DC

## 2017-08-22 NOTE — Progress Notes (Signed)
Cardiology Office Note    Date:  08/22/2017   ID:  Carrie, [name] [birth year], MRN [id number]  PCP:  Antonietta Jewel, MD  Cardiologist: Dr. Meda Coffee EPS: Dr. Rayann Heman  Chief Complaint  Patient presents with  . Pre-op Exam    History of Present Illness:  Carrie [name] is a 66 y.o. female with history of CAD and severe AS S/P CABG x 2 LIMA-LAD, SVG-RCA and bioprosthetic AVR 06/28/14. Post op AFib converted to NSR with Amio. LVEF 60-65% mild MR on echo 07/2014, carotids 05/2014 R 40-59%, L 60-79%.She also has HTN, HLD. Last seen by Dr. Meda Coffee 12/2014.  Patient had surgery cancelled and is here for surgical clearance for left total hip replacement.  Patient has done well over the past several years and remained active until recently because of her hip pain. She denies any chest pain, palpitations, dyspnea, dyspnea on exertion, dizziness or presyncope. BP is high and she says it goes up and down. Primary care manages this.  Based on [facility] activity status the patient can go 6.27 metastases. Risk of major cardiac event based on revised cardiac risk index is 0.9%.     Past Medical History:  Diagnosis Date  . Carotid bruit   . Chronic lower back pain    [facility]  . Coronary artery disease    CABG 06/2014.  Normal LV function.  . DDD (degenerative disc disease), lumbar    [facility]  . Dysrhythmia   . Heart murmur   . History of aortic valve replacement 06/2014   (pericardial tissue valve) for AS  . Hx of echocardiogram 07/2014   Echo (9/15):  EF 60-65%, no RWMA, mild MR, mild LAE, AVR ok (mean 15 mmHg)  . Hyperlipidemia   . Hypertension   . Hypothyroidism 06/2014   started on levothyroxine while in hospital for her CABG  . Myocardial infarction ([place])    mini  . Sinus bradycardia   . Tobacco dependence in remission     Past Surgical History:  Procedure Laterality Date  . ABDOMINAL HYSTERECTOMY  1970's  . AORTIC VALVE REPLACEMENT [name] 06/28/2014   Procedure: Aortic valve  replacement using [facility] Ease 60mm valve Serial number [id number];  Surgeon: Grace Isaac, MD;  Location: [place];  Service: Open Heart Surgery;  Laterality: [name];  . CARDIAC CATHETERIZATION  06/20/2014  . CATARACT EXTRACTION W/ INTRAOCULAR LENS  IMPLANT, BILATERAL Bilateral ~ 02/2014  . CORONARY ARTERY BYPASS GRAFT [name] 06/28/2014   Procedure: Coronary artery bypass grafting on pump using left internal mammary artery to left anterior descending artery, right greater saphenous vein to right coronary artery.;  Surgeon: Grace Isaac, MD;  Location: [facility];  Service: Open Heart Surgery;  Laterality: [name];  . HEEL SPUR SURGERY Right   . HIP ORIF W/ CAPSULOTOMY Left 1980's  . INTRAOPERATIVE TRANSESOPHAGEAL ECHOCARDIOGRAM [name] 06/28/2014   Procedure: INTRAOPERATIVE TRANSESOPHAGEAL ECHOCARDIOGRAM;  Surgeon: Grace Isaac, MD;  Location: [facility];  Service: Open Heart Surgery;  Laterality: [name];  . LEFT HEART CATHETERIZATION WITH CORONARY ANGIOGRAM [name] 06/20/2014   Procedure: LEFT HEART CATHETERIZATION WITH CORONARY ANGIOGRAM;  Surgeon: Peter M Martinique, MD;  [facility];  Service: Cardiovascular;  Laterality: [name];  . MULTIPLE EXTRACTIONS WITH ALVEOLOPLASTY [name] 06/25/2014   Procedure: Extraction of tooth #'s 2,3,4,14, 18, 19 with alveoloplasty and gross debridement of remaining teeth;  Surgeon: Lenn Cal, DDS;  Location: [facility];  Service: Oral Surgery;  Laterality: [name];  . TONSILLECTOMY  1978    Current Medications: Current Meds  Medication Sig  . atorvastatin (LIPITOR) 40 MG tablet Take 1 tablet (40 mg total) by mouth daily.  . cloNIDine (CATAPRES) 0.2 MG tablet Take 0.2 mg by mouth 3 (three) times daily.  . hydrALAZINE (APRESOLINE) 50 MG tablet Take 50 mg by mouth 2 (two) times daily.  Marland Kitchen levothyroxine (SYNTHROID, LEVOTHROID) 50 MCG tablet Take 1 tablet (50 mcg total) by mouth daily before breakfast.  . lisinopril (PRINIVIL,ZESTRIL) 20 MG tablet Take 20 mg by mouth daily.  . metoprolol  (TOPROL-XL) 200 MG 24 hr tablet Take 200 mg by mouth daily.  Marland Kitchen oxyCODONE-acetaminophen (PERCOCET) 10-325 MG tablet Take 1 tablet by mouth 3 (three) times daily.     Allergies:   Morphine   Social History   Social History  . Marital status: Married    Spouse name: [name]  . Number of children: [name]  . Years of education: [name]   Occupational History  .  [employer]   Social History Main Topics  . Smoking status: Former Smoker    Packs/day: 0.75    Years: 50.00    Types: Cigarettes    Quit date: 06/22/2014  . Smokeless tobacco: Never Used     Comment: 06/20/2014 "I've been using the e-cigarettes to try to get me off the regular cigarettes"  . Alcohol use No  . Drug use: No  . Sexual activity: Not Currently   Other Topics Concern  . None   Social History Narrative   Marital status: married x 13 years      Children: 2 children; 3 grandchildren.      Lives: with husband, 2 dogs.      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  Quit 06/2014.      Alcohol:  None      Drugs; None      Exercise: none     Family History:  The patient's family history includes Heart disease in her mother; Heart disease (age of onset: 45) in her father; Hyperlipidemia in her brother; Hypertension in her father and mother; Stroke in her mother.   ROS:   Please see the history of present illness.    Review of Systems  Constitution: Negative.  HENT: Negative.   Eyes: Negative.   Cardiovascular: Negative.   Respiratory: Negative.   Hematologic/Lymphatic: Negative.   Musculoskeletal: Positive for back pain and joint pain.  Gastrointestinal: Negative.   Genitourinary: Negative.   Neurological: Negative.    All other systems reviewed and are negative.   PHYSICAL EXAM:   VS:  BP (!) 170/90   Pulse (!) 53   Ht 5\' 6"  (1.676 m)   Wt 181 lb (82.1 kg)   SpO2 97%   BMI 29.21 kg/m   Physical Exam  GEN: Well nourished, well developed, in no acute  distress  Neck:Bilateral carotid bruits no JVD, or masses Cardiac:RRR; 2/6 systolic murmur at the left sternal border  Respiratory:  clear to auscultation bilaterally, normal work of breathing GI: soft, nontender, nondistended, + BS Ext: without cyanosis, clubbing, or edema, Good distal pulses bilaterally Neuro:  Alert and Oriented x 3 Psych: euthymic mood, full affect  Wt Readings from Last 3 Encounters:  08/22/17 181 lb (82.1 kg)  07/28/17 180 lb (81.6 kg)  01/17/15 165 lb (74.8 kg)      Studies/Labs Reviewed:   EKG:  EKG is  ordered today.  The ekg ordered today demonstrates Sinus bradycardia at 52 bpm with LVH  Recent Labs: 07/28/2017: BUN 18; Creatinine, Ser 1.29; Hemoglobin 12.2; Platelets 194; Potassium 5.2; Sodium 140   Lipid Panel    Component Value Date/Time   CHOL 202 (H) 08/27/2009 1027   TRIG 298.0 (H) 08/27/2009 1027   HDL 33.90 (L) 08/27/2009 1027   CHOLHDL 6 08/27/2009 1027   VLDL 59.6 (H) 08/27/2009 1027   LDLDIRECT 101.9 08/27/2009 1027    Additional studies/ records that were reviewed today include:  2Decho 07/2014 Study Conclusions  - Left ventricle: The cavity size was normal. Wall thickness was   normal. Systolic function was normal. The estimated ejection   fraction was in the range of 60% to 65%. Wall motion was normal;   there were no regional wall motion abnormalities. - Aortic valve: Mildly calcified annulus. - Mitral valve: There was mild regurgitation. - Left atrium: The atrium was mildly dilated.     ASSESSMENT:    1. Preoperative clearance   2. S/P CABG x 2   3. S/P AVR (aortic valve replacement)   4. Essential hypertension   5. Bilateral carotid bruits   6. Chronic renal impairment, stage 3 (moderate) (HCC)   7. HYPERCHOLESTEROLEMIA      PLAN:  In order of problems listed above:  Preoperative clearance before undergoing hip replacement. She has history of CABG 2 and aVR in 2015 and has done well without cardiac  symptoms.Based on [facility] activity status the patient can go 6.27 metastases. Risk of major cardiac event based on revised cardiac risk index is 0.9%. She can proceed with surgery without any further cardiac testing.  Status post CABG 2 and aVR in 2015 doing well without symptoms.  Essential hypertension blood pressure is elevated today. We'll add Norvasc 5 mg daily. 2 g sodium diet. Patient does eat a lot of canned foods. Because of her bradycardia and renal insufficiency cannot increase her current medications.  Bilateral carotid bruits with disease in the past. Repeat carotid Dopplers.  Chronic renal insufficiency stage III creatinine 1.29 on recent labs  Hypercholesterolemia on Lipitor managed by primary care   Medication Adjustments/Labs and Tests Ordered: Current medicines are reviewed at length with the patient today.  Concerns regarding medicines are outlined above.  Medication changes, Labs and Tests ordered today are listed in the Patient Instructions below. Patient Instructions  Medication Instructions: Your physician has recommended you make the following change in your medication:  -1) START Amlodipine (Norvasc) - Take 1 tablet (5 mg) by mouth daily  Labwork: None Ordered  Procedures/Testing: Your physician has requested that you have a carotid duplex. This test is an ultrasound of the carotid arteries in your neck. It looks at blood flow through these arteries that supply the brain with blood. Allow one hour for this exam. There are no restrictions or special instructions.  Follow-Up: Your physician wants you to follow-up in: 1 YEAR with Dr. Meda Coffee. You will receive a reminder letter in the mail two months in advance. If you don't receive a letter, please call our office to schedule the follow-up appointment.   If you need a refill on your cardiac medications before your next appointment, please call your pharmacy.    DASH Eating Plan DASH stands for "Dietary Approaches  to Stop Hypertension." The DASH eating plan is a healthy eating plan that has been shown to reduce high blood pressure (hypertension). It may also reduce your risk for type 2 diabetes, heart disease, and stroke.  The DASH eating plan may also help with weight loss. What are tips for following this plan? General guidelines  Avoid eating more than 2,000 mg (milligrams) of salt (sodium) a day. If you have hypertension, you may need to reduce your sodium intake to 1,500 mg a day.  Limit alcohol intake to no more than 1 drink a day for nonpregnant women and 2 drinks a day for men. One drink equals 12 oz of beer, 5 oz of wine, or 1 oz of hard liquor.  Work with your health care provider to maintain a healthy body weight or to lose weight. Ask what an ideal weight is for you.  Get at least 30 minutes of exercise that causes your heart to beat faster (aerobic exercise) most days of the week. Activities may include walking, swimming, or biking.  Work with your health care provider or diet and nutrition specialist (dietitian) to adjust your eating plan to your individual calorie needs. Reading food labels  Check food labels for the amount of sodium per serving. Choose foods with less than 5 percent of the Daily Value of sodium. Generally, foods with less than 300 mg of sodium per serving fit into this eating plan.  To find whole grains, look for the word "whole" as the first word in the ingredient list. Shopping  Buy products labeled as "low-sodium" or "no salt added."  Buy fresh foods. Avoid canned foods and premade or frozen meals. Cooking  Avoid adding salt when cooking. Use salt-free seasonings or herbs instead of table salt or sea salt. Check with your health care provider or pharmacist before using salt substitutes.  Do not fry foods. Cook foods using healthy methods such as baking, boiling, grilling, and broiling instead.  Cook with heart-healthy oils, such as olive, canola, soybean, or  sunflower oil. Meal planning   Eat a balanced diet that includes: ? 5 or more servings of fruits and vegetables each day. At each meal, try to fill half of your plate with fruits and vegetables. ? Up to 6-8 servings of whole grains each day. ? Less than 6 oz of lean meat, poultry, or fish each day. A 3-oz serving of meat is about the same size as a deck of cards. One egg equals 1 oz. ? 2 servings of low-fat dairy each day. ? A serving of nuts, seeds, or beans 5 times each week. ? Heart-healthy fats. Healthy fats called Omega-3 fatty acids are found in foods such as flaxseeds and coldwater fish, like sardines, salmon, and mackerel.  Limit how much you eat of the following: ? Canned or prepackaged foods. ? Food that is high in trans fat, such as fried foods. ? Food that is high in saturated fat, such as fatty meat. ? Sweets, desserts, sugary drinks, and other foods with added sugar. ? Full-fat dairy products.  Do not salt foods before eating.  Try to eat at least 2 vegetarian meals each week.  Eat more home-cooked food and less restaurant, buffet, and fast food.  When eating at a restaurant, ask that your food be prepared with less salt or no salt, if possible. What foods are recommended? The items listed may not be a complete list. Talk with your dietitian about what dietary choices are best for you. Grains Whole-grain or whole-wheat bread. Whole-grain or whole-wheat pasta. Brown rice. [place]. Bulgur. Whole-grain and low-sodium cereals. Pita bread. Low-fat, low-sodium crackers. Whole-wheat flour tortillas. Vegetables Fresh or frozen vegetables (raw, steamed, roasted, or grilled). Low-sodium or  reduced-sodium tomato and vegetable juice. Low-sodium or reduced-sodium tomato sauce and tomato paste. Low-sodium or reduced-sodium canned vegetables. Fruits All fresh, dried, or frozen fruit. Canned fruit in natural juice (without added sugar). Meat and other protein foods Skinless  chicken or [place]. Ground chicken or [place]. Pork with fat trimmed off. Fish and seafood. Egg whites. Dried beans, peas, or lentils. Unsalted nuts, nut butters, and seeds. Unsalted canned beans. Lean cuts of beef with fat trimmed off. Low-sodium, lean deli meat. Dairy Low-fat (1%) or fat-free (skim) milk. Fat-free, low-fat, or reduced-fat cheeses. Nonfat, low-sodium ricotta or cottage cheese. Low-fat or nonfat yogurt. Low-fat, low-sodium cheese. Fats and oils Soft margarine without trans fats. Vegetable oil. Low-fat, reduced-fat, or light mayonnaise and salad dressings (reduced-sodium). Canola, safflower, olive, soybean, and sunflower oils. Avocado. Seasoning and other foods Herbs. Spices. Seasoning mixes without salt. Unsalted popcorn and pretzels. Fat-free sweets. What foods are not recommended? The items listed may not be a complete list. Talk with your dietitian about what dietary choices are best for you. Grains Baked goods made with fat, such as croissants, muffins, or some breads. Dry pasta or rice meal packs. Vegetables Creamed or fried vegetables. Vegetables in a cheese sauce. Regular canned vegetables (not low-sodium or reduced-sodium). Regular canned tomato sauce and paste (not low-sodium or reduced-sodium). Regular tomato and vegetable juice (not low-sodium or reduced-sodium). Angie Fava. Olives. Fruits Canned fruit in a light or heavy syrup. Fried fruit. Fruit in cream or butter sauce. Meat and other protein foods Fatty cuts of meat. Ribs. Fried meat. Berniece Salines. Sausage. Bologna and other processed lunch meats. Salami. Fatback. Hotdogs. Bratwurst. Salted nuts and seeds. Canned beans with added salt. Canned or smoked fish. Whole eggs or egg yolks. Chicken or [place] with skin. Dairy Whole or 2% milk, cream, and half-and-half. Whole or full-fat cream cheese. Whole-fat or sweetened yogurt. Full-fat cheese. Nondairy creamers. Whipped toppings. Processed cheese and cheese spreads. Fats and  oils Butter. Stick margarine. Lard. Shortening. Ghee. Bacon fat. Tropical oils, such as coconut, palm kernel, or palm oil. Seasoning and other foods Salted popcorn and pretzels. Onion salt, garlic salt, seasoned salt, table salt, and sea salt. Worcestershire sauce. Tartar sauce. Barbecue sauce. Teriyaki sauce. Soy sauce, including reduced-sodium. Steak sauce. Canned and packaged gravies. Fish sauce. Oyster sauce. Cocktail sauce. Horseradish that you find on the shelf. Ketchup. Mustard. Meat flavorings and tenderizers. Bouillon cubes. Hot sauce and Tabasco sauce. Premade or packaged marinades. Premade or packaged taco seasonings. Relishes. Regular salad dressings. Where to find more information:  National Heart, Lung, and [place]: https://wilson-eaton.com/  American Heart Association: www.heart.org Summary  The DASH eating plan is a healthy eating plan that has been shown to reduce high blood pressure (hypertension). It may also reduce your risk for type 2 diabetes, heart disease, and stroke.  With the DASH eating plan, you should limit salt (sodium) intake to 2,000 mg a day. If you have hypertension, you may need to reduce your sodium intake to 1,500 mg a day.  When on the DASH eating plan, aim to eat more fresh fruits and vegetables, whole grains, lean proteins, low-fat dairy, and heart-healthy fats.  Work with your health care provider or diet and nutrition specialist (dietitian) to adjust your eating plan to your individual calorie needs. This information is not intended to replace advice given to you by your health care provider. Make sure you discuss any questions you have with your health care provider. Document Released: 10/28/2011 Document Revised: 11/01/2016 Document Reviewed: 11/01/2016 Elsevier Interactive Patient Education  2017 [place], Ermalinda Barrios, [place]  08/22/2017 8:50 AM    [facility] [place], [place], [place]   [address] Phone: [phone]; Fax: [phone]

## 2017-08-22 NOTE — Patient Instructions (Addendum)
Medication Instructions: Your physician has recommended you make the following change in your medication:  -1) START Amlodipine (Norvasc) - Take 1 tablet (5 mg) by mouth daily  Labwork: None Ordered  Procedures/Testing: Your physician has requested that you have a carotid duplex. This test is an ultrasound of the carotid arteries in your neck. It looks at blood flow through these arteries that supply the brain with blood. Allow one hour for this exam. There are no restrictions or special instructions.  Follow-Up: Your physician wants you to follow-up in: 1 YEAR with Dr. Meda Coffee. You will receive a reminder letter in the mail two months in advance. If you don't receive a letter, please call our office to schedule the follow-up appointment.   If you need a refill on your cardiac medications before your next appointment, please call your pharmacy.    DASH Eating Plan DASH stands for "Dietary Approaches to Stop Hypertension." The DASH eating plan is a healthy eating plan that has been shown to reduce high blood pressure (hypertension). It may also reduce your risk for type 2 diabetes, heart disease, and stroke. The DASH eating plan may also help with weight loss. What are tips for following this plan? General guidelines  Avoid eating more than 2,000 mg (milligrams) of salt (sodium) a day. If you have hypertension, you may need to reduce your sodium intake to 1,500 mg a day.  Limit alcohol intake to no more than 1 drink a day for nonpregnant women and 2 drinks a day for men. One drink equals 12 oz of beer, 5 oz of wine, or 1 oz of hard liquor.  Work with your health care provider to maintain a healthy body weight or to lose weight. Ask what an ideal weight is for you.  Get at least 30 minutes of exercise that causes your heart to beat faster (aerobic exercise) most days of the week. Activities may include walking, swimming, or biking.  Work with your health care provider or diet and  nutrition specialist (dietitian) to adjust your eating plan to your individual calorie needs. Reading food labels  Check food labels for the amount of sodium per serving. Choose foods with less than 5 percent of the Daily Value of sodium. Generally, foods with less than 300 mg of sodium per serving fit into this eating plan.  To find whole grains, look for the word "whole" as the first word in the ingredient list. Shopping  Buy products labeled as "low-sodium" or "no salt added."  Buy fresh foods. Avoid canned foods and premade or frozen meals. Cooking  Avoid adding salt when cooking. Use salt-free seasonings or herbs instead of table salt or sea salt. Check with your health care provider or pharmacist before using salt substitutes.  Do not fry foods. Cook foods using healthy methods such as baking, boiling, grilling, and broiling instead.  Cook with heart-healthy oils, such as olive, canola, soybean, or sunflower oil. Meal planning   Eat a balanced diet that includes: ? 5 or more servings of fruits and vegetables each day. At each meal, try to fill half of your plate with fruits and vegetables. ? Up to 6-8 servings of whole grains each day. ? Less than 6 oz of lean meat, poultry, or fish each day. A 3-oz serving of meat is about the same size as a deck of cards. One egg equals 1 oz. ? 2 servings of low-fat dairy each day. ? A serving of nuts, seeds, or beans 5 times each  week. ? Heart-healthy fats. Healthy fats called Omega-3 fatty acids are found in foods such as flaxseeds and coldwater fish, like sardines, salmon, and mackerel.  Limit how much you eat of the following: ? Canned or prepackaged foods. ? Food that is high in trans fat, such as fried foods. ? Food that is high in saturated fat, such as fatty meat. ? Sweets, desserts, sugary drinks, and other foods with added sugar. ? Full-fat dairy products.  Do not salt foods before eating.  Try to eat at least 2 vegetarian  meals each week.  Eat more home-cooked food and less restaurant, buffet, and fast food.  When eating at a restaurant, ask that your food be prepared with less salt or no salt, if possible. What foods are recommended? The items listed may not be a complete list. Talk with your dietitian about what dietary choices are best for you. Grains Whole-grain or whole-wheat bread. Whole-grain or whole-wheat pasta. Brown rice. [place]. Bulgur. Whole-grain and low-sodium cereals. Pita bread. Low-fat, low-sodium crackers. Whole-wheat flour tortillas. Vegetables Fresh or frozen vegetables (raw, steamed, roasted, or grilled). Low-sodium or reduced-sodium tomato and vegetable juice. Low-sodium or reduced-sodium tomato sauce and tomato paste. Low-sodium or reduced-sodium canned vegetables. Fruits All fresh, dried, or frozen fruit. Canned fruit in natural juice (without added sugar). Meat and other protein foods Skinless chicken or [place]. Ground chicken or [place]. Pork with fat trimmed off. Fish and seafood. Egg whites. Dried beans, peas, or lentils. Unsalted nuts, nut butters, and seeds. Unsalted canned beans. Lean cuts of beef with fat trimmed off. Low-sodium, lean deli meat. Dairy Low-fat (1%) or fat-free (skim) milk. Fat-free, low-fat, or reduced-fat cheeses. Nonfat, low-sodium ricotta or cottage cheese. Low-fat or nonfat yogurt. Low-fat, low-sodium cheese. Fats and oils Soft margarine without trans fats. Vegetable oil. Low-fat, reduced-fat, or light mayonnaise and salad dressings (reduced-sodium). Canola, safflower, olive, soybean, and sunflower oils. Avocado. Seasoning and other foods Herbs. Spices. Seasoning mixes without salt. Unsalted popcorn and pretzels. Fat-free sweets. What foods are not recommended? The items listed may not be a complete list. Talk with your dietitian about what dietary choices are best for you. Grains Baked goods made with fat, such as croissants, muffins, or some  breads. Dry pasta or rice meal packs. Vegetables Creamed or fried vegetables. Vegetables in a cheese sauce. Regular canned vegetables (not low-sodium or reduced-sodium). Regular canned tomato sauce and paste (not low-sodium or reduced-sodium). Regular tomato and vegetable juice (not low-sodium or reduced-sodium). Angie Fava. Olives. Fruits Canned fruit in a light or heavy syrup. Fried fruit. Fruit in cream or butter sauce. Meat and other protein foods Fatty cuts of meat. Ribs. Fried meat. Berniece Salines. Sausage. Bologna and other processed lunch meats. Salami. Fatback. Hotdogs. Bratwurst. Salted nuts and seeds. Canned beans with added salt. Canned or smoked fish. Whole eggs or egg yolks. Chicken or [place] with skin. Dairy Whole or 2% milk, cream, and half-and-half. Whole or full-fat cream cheese. Whole-fat or sweetened yogurt. Full-fat cheese. Nondairy creamers. Whipped toppings. Processed cheese and cheese spreads. Fats and oils Butter. Stick margarine. Lard. Shortening. Ghee. Bacon fat. Tropical oils, such as coconut, palm kernel, or palm oil. Seasoning and other foods Salted popcorn and pretzels. Onion salt, garlic salt, seasoned salt, table salt, and sea salt. Worcestershire sauce. Tartar sauce. Barbecue sauce. Teriyaki sauce. Soy sauce, including reduced-sodium. Steak sauce. Canned and packaged gravies. Fish sauce. Oyster sauce. Cocktail sauce. Horseradish that you find on the shelf. Ketchup. Mustard. Meat flavorings and tenderizers. Bouillon cubes. Hot sauce and  Tabasco sauce. Premade or packaged marinades. Premade or packaged taco seasonings. Relishes. Regular salad dressings. Where to find more information:  National Heart, Lung, and [place]: https://wilson-eaton.com/  American Heart Association: www.heart.org Summary  The DASH eating plan is a healthy eating plan that has been shown to reduce high blood pressure (hypertension). It may also reduce your risk for type 2 diabetes, heart disease, and  stroke.  With the DASH eating plan, you should limit salt (sodium) intake to 2,000 mg a day. If you have hypertension, you may need to reduce your sodium intake to 1,500 mg a day.  When on the DASH eating plan, aim to eat more fresh fruits and vegetables, whole grains, lean proteins, low-fat dairy, and heart-healthy fats.  Work with your health care provider or diet and nutrition specialist (dietitian) to adjust your eating plan to your individual calorie needs. This information is not intended to replace advice given to you by your health care provider. Make sure you discuss any questions you have with your health care provider. Document Released: 10/28/2011 Document Revised: 11/01/2016 Document Reviewed: 11/01/2016 Elsevier Interactive Patient Education  2017 [employer].

## 2017-08-24 ENCOUNTER — Telehealth: Payer: Self-pay | Admitting: Physician Assistant

## 2017-08-24 NOTE — Telephone Encounter (Signed)
Patient informed that her recent office note r/e pre-op clearance has been faxed to [facility].

## 2017-08-24 NOTE — Telephone Encounter (Signed)
New message     Patient received call from [facility] stating they have not received faxed clearance form from 10/1 office visit. Patient request clearance to be resent to [facility].

## 2017-08-25 NOTE — Progress Notes (Signed)
Please place orders in EPIC as patient is being scheduled for a pre-op appointment! Thank you! 

## 2017-09-05 NOTE — Patient Instructions (Signed)
Nattalie Santiesteban  09/05/2017   Your procedure is scheduled on: 09/12/2017    Report to [facility] Main  Entrance .  Report to admitting at   12 noon     Call this number if you have problems the morning of surgery  [phone]   Remember: ONLY 1 PERSON MAY GO WITH YOU TO SHORT STAY TO GET  READY MORNING OF [place].  Do not eat food after midnite.  May have clear liquids from 12 midnite until 0800am morning of surgery then nothing by mouth.      Take these medicines the morning of surgery with A SIP OF WATER: amlodipine ( NORvasc), Clonidine, Hydralazine, Synthroid, oxycodone if needed, Metoprolol ( Toprol )                                 You may not have any metal on your body including hair pins and              piercings  Do not wear jewelry, make-up, lotions, powders or perfumes, deodorant             Do not wear nail polish.  Do not shave  48 hours prior to surgery.               Do not bring valuables to the hospital. [place].  Contacts, dentures or bridgework may not be worn into surgery.  Leave suitcase in the car. After surgery it may be brought to your room.                   Please read over the following fact sheets you were given: _____________________________________________________________________             [facility] - Preparing for Surgery Before surgery, you can play an important role.  Because skin is not sterile, your skin needs to be as free of germs as possible.  You can reduce the number of germs on your skin by washing with CHG (chlorahexidine gluconate) soap before surgery.  CHG is an antiseptic cleaner which kills germs and bonds with the skin to continue killing germs even after washing. Please DO NOT use if you have an allergy to CHG or antibacterial soaps.  If your skin becomes reddened/irritated stop using the CHG and inform your nurse when you arrive at Short  Stay. Do not shave (including legs and underarms) for at least 48 hours prior to the first CHG shower.  You may shave your face/neck. Please follow these instructions carefully:  1.  Shower with CHG Soap the night before surgery and the  morning of Surgery.  2.  If you choose to wash your hair, wash your hair first as usual with your  normal  shampoo.  3.  After you shampoo, rinse your hair and body thoroughly to remove the  shampoo.                           4.  Use CHG as you would any other liquid soap.  You can apply chg directly  to the skin and wash  Gently with a scrungie or clean washcloth.  5.  Apply the CHG Soap to your body ONLY FROM THE NECK DOWN.   Do not use on face/ open                           Wound or open sores. Avoid contact with eyes, ears mouth and genitals (private parts).                       Wash face,  Genitals (private parts) with your normal soap.             6.  Wash thoroughly, paying special attention to the area where your surgery  will be performed.  7.  Thoroughly rinse your body with warm water from the neck down.  8.  DO NOT shower/wash with your normal soap after using and rinsing off  the CHG Soap.                9.  Pat yourself dry with a clean towel.            10.  Wear clean pajamas.            11.  Place clean sheets on your bed the night of your first shower and do not  sleep with pets. Day of Surgery : Do not apply any lotions/deodorants the morning of surgery.  Please wear clean clothes to the hospital/surgery center.  FAILURE TO FOLLOW THESE INSTRUCTIONS MAY RESULT IN THE CANCELLATION OF YOUR SURGERY PATIENT SIGNATURE_________________________________  NURSE SIGNATURE__________________________________  ________________________________________________________________________  WHAT IS A BLOOD TRANSFUSION? Blood Transfusion Information  A transfusion is the replacement of blood or some of its parts. Blood is made up of  multiple cells which provide different functions.  Red blood cells carry oxygen and are used for blood loss replacement.  White blood cells fight against infection.  Platelets control bleeding.  Plasma helps clot blood.  Other blood products are available for specialized needs, such as hemophilia or other clotting disorders. BEFORE THE TRANSFUSION  Who gives blood for transfusions?   Healthy volunteers who are fully evaluated to make sure their blood is safe. This is blood bank blood. Transfusion therapy is the safest it has ever been in the practice of medicine. Before blood is taken from a donor, a complete history is taken to make sure that person has no history of diseases nor engages in risky social behavior (examples are intravenous drug use or sexual activity with multiple partners). The donor's travel history is screened to minimize risk of transmitting infections, such as malaria. The donated blood is tested for signs of infectious diseases, such as HIV and hepatitis. The blood is then tested to be sure it is compatible with you in order to minimize the chance of a transfusion reaction. If you or a relative donates blood, this is often done in anticipation of surgery and is not appropriate for emergency situations. It takes many days to process the donated blood. RISKS AND COMPLICATIONS Although transfusion therapy is very safe and saves many lives, the main dangers of transfusion include:   Getting an infectious disease.  Developing a transfusion reaction. This is an allergic reaction to something in the blood you were given. Every precaution is taken to prevent this. The decision to have a blood transfusion has been considered carefully by your caregiver before blood is given. Blood is not given unless the benefits outweigh  the risks. AFTER THE TRANSFUSION  Right after receiving a blood transfusion, you will usually feel much better and more energetic. This is especially true if  your red blood cells have gotten low (anemic). The transfusion raises the level of the red blood cells which carry oxygen, and this usually causes an energy increase.  The nurse administering the transfusion will monitor you carefully for complications. HOME CARE INSTRUCTIONS  No special instructions are needed after a transfusion. You may find your energy is better. Speak with your caregiver about any limitations on activity for underlying diseases you may have. SEEK MEDICAL CARE IF:   Your condition is not improving after your transfusion.  You develop redness or irritation at the intravenous (IV) site. SEEK IMMEDIATE MEDICAL CARE IF:  Any of the following symptoms occur over the next 12 hours:  Shaking chills.  You have a temperature by mouth above 102 F (38.9 C), not controlled by medicine.  Chest, back, or muscle pain.  People around you feel you are not acting correctly or are confused.  Shortness of breath or difficulty breathing.  Dizziness and fainting.  You get a rash or develop hives.  You have a decrease in urine output.  Your urine turns a dark color or changes to pink, red, or brown. Any of the following symptoms occur over the next 10 days:  You have a temperature by mouth above 102 F (38.9 C), not controlled by medicine.  Shortness of breath.  Weakness after normal activity.  The white part of the eye turns yellow (jaundice).  You have a decrease in the amount of urine or are urinating less often.  Your urine turns a dark color or changes to pink, red, or brown. Document Released: 11/05/2000 Document Revised: 01/31/2012 Document Reviewed: 06/24/2008 ExitCare Patient Information 2014 [place].  _______________________________________________________________________  Incentive Spirometer  An incentive spirometer is a tool that can help keep your lungs clear and active. This tool measures how well you are filling your lungs with each breath.  Taking long deep breaths may help reverse or decrease the chance of developing breathing (pulmonary) problems (especially infection) following:  A long period of time when you are unable to move or be active. BEFORE THE PROCEDURE   If the spirometer includes an indicator to show your best effort, your nurse or respiratory therapist will set it to a desired goal.  If possible, sit up straight or lean slightly forward. Try not to slouch.  Hold the incentive spirometer in an upright position. INSTRUCTIONS FOR USE  1. Sit on the edge of your bed if possible, or sit up as far as you can in bed or on a chair. 2. Hold the incentive spirometer in an upright position. 3. Breathe out normally. 4. Place the mouthpiece in your mouth and seal your lips tightly around it. 5. Breathe in slowly and as deeply as possible, raising the piston or the ball toward the top of the column. 6. Hold your breath for 3-5 seconds or for as long as possible. Allow the piston or ball to fall to the bottom of the column. 7. Remove the mouthpiece from your mouth and breathe out normally. 8. Rest for a few seconds and repeat Steps 1 through 7 at least 10 times every 1-2 hours when you are awake. Take your time and take a few normal breaths between deep breaths. 9. The spirometer may include an indicator to show your best effort. Use the indicator as a goal to work  toward during each repetition. 10. After each set of 10 deep breaths, practice coughing to be sure your lungs are clear. If you have an incision (the cut made at the time of surgery), support your incision when coughing by placing a pillow or rolled up towels firmly against it. Once you are able to get out of bed, walk around indoors and cough well. You may stop using the incentive spirometer when instructed by your caregiver.  RISKS AND COMPLICATIONS  Take your time so you do not get dizzy or light-headed.  If you are in pain, you may need to take or ask for pain  medication before doing incentive spirometry. It is harder to take a deep breath if you are having pain. AFTER USE  Rest and breathe slowly and easily.  It can be helpful to keep track of a log of your progress. Your caregiver can provide you with a simple table to help with this. If you are using the spirometer at home, follow these instructions: [place] IF:   You are having difficultly using the spirometer.  You have trouble using the spirometer as often as instructed.  Your pain medication is not giving enough relief while using the spirometer.  You develop fever of 100.5 F (38.1 C) or higher. SEEK IMMEDIATE MEDICAL CARE IF:   You cough up bloody sputum that had not been present before.  You develop fever of 102 F (38.9 C) or greater.  You develop worsening pain at or near the incision site. MAKE SURE YOU:   Understand these instructions.  Will watch your condition.  Will get help right away if you are not doing well or get worse. Document Released: 03/21/2007 Document Revised: 01/31/2012 Document Reviewed: 05/22/2007 ExitCare Patient Information 2014 ExitCare, [place].   ________________________________________________________________________    CLEAR LIQUID DIET   Foods Allowed                                                                     Foods Excluded  Coffee and tea, regular and decaf                             liquids that you cannot  Plain Jell-O in any flavor                                             see through such as: Fruit ices (not with fruit pulp)                                     milk, soups, orange juice  Iced Popsicles                                    All solid food Carbonated beverages, regular and diet  Cranberry, grape and apple juices Sports drinks like Gatorade Lightly seasoned clear broth or consume(fat free) Sugar, honey syrup  Sample Menu Breakfast                                 Lunch                                     Supper Cranberry juice                    Beef broth                            Chicken broth Jell-O                                     Grape juice                           Apple juice Coffee or tea                        Jell-O                                      Popsicle                                                Coffee or tea                        Coffee or tea  _____________________________________________________________________

## 2017-09-06 ENCOUNTER — Encounter (HOSPITAL_COMMUNITY)
Admission: RE | Admit: 2017-09-06 | Discharge: 2017-09-06 | Disposition: A | Discharge status: Home / Self Care | Payer: Medicare HMO | Source: Ambulatory Visit | Attending: Orthopedic Surgery | Admitting: Orthopedic Surgery

## 2017-09-06 ENCOUNTER — Encounter (HOSPITAL_COMMUNITY): Payer: Self-pay

## 2017-09-06 DIAGNOSIS — Z01812 Encounter for preprocedural laboratory examination: Secondary | ICD-10-CM | POA: Diagnosis present

## 2017-09-06 DIAGNOSIS — Z01810 Encounter for preprocedural cardiovascular examination: Secondary | ICD-10-CM | POA: Diagnosis present

## 2017-09-06 DIAGNOSIS — Z9689 Presence of other specified functional implants: Secondary | ICD-10-CM | POA: Insufficient documentation

## 2017-09-06 DIAGNOSIS — M1612 Unilateral primary osteoarthritis, left hip: Secondary | ICD-10-CM | POA: Insufficient documentation

## 2017-09-06 DIAGNOSIS — Z9889 Other specified postprocedural states: Secondary | ICD-10-CM | POA: Insufficient documentation

## 2017-09-06 HISTORY — DX: Gastro-esophageal reflux disease without esophagitis: K21.9

## 2017-09-06 LAB — CBC
HEMATOCRIT: 39.2 % (ref 36.0–46.0)
Hemoglobin: 12.9 g/dL (ref 12.0–15.0)
MCH: 29.9 pg (ref 26.0–34.0)
MCHC: 32.9 g/dL (ref 30.0–36.0)
MCV: 90.7 fL (ref 78.0–100.0)
PLATELETS: 210 10*3/uL (ref 150–400)
RBC: 4.32 MIL/uL (ref 3.87–5.11)
RDW: 14.3 % (ref 11.5–15.5)
WBC: 9.2 10*3/uL (ref 4.0–10.5)

## 2017-09-06 LAB — BASIC METABOLIC PANEL
Anion gap: 7 (ref 5–15)
BUN: 29 mg/dL — ABNORMAL HIGH (ref 6–20)
CO2: 27 mmol/L (ref 22–32)
CREATININE: 1.6 mg/dL — AB (ref 0.44–1.00)
Calcium: 8.9 mg/dL (ref 8.9–10.3)
Chloride: 105 mmol/L (ref 101–111)
GFR, EST AFRICAN AMERICAN: 37 mL/min — AB (ref >60)
GFR, EST NON AFRICAN AMERICAN: 32 mL/min — AB (ref >60)
Glucose, Bld: 104 mg/dL — ABNORMAL HIGH (ref 65–99)
POTASSIUM: 4.5 mmol/L (ref 3.5–5.1)
SODIUM: 139 mmol/L (ref 135–145)

## 2017-09-06 LAB — SURGICAL PCR SCREEN
MRSA, PCR: NEGATIVE
STAPHYLOCOCCUS AUREUS: POSITIVE — AB

## 2017-09-06 NOTE — Progress Notes (Signed)
BMP done 09/06/17-epic

## 2017-09-06 NOTE — Progress Notes (Addendum)
EKG-08/22/17-epic  08/22/2017-LOV-Michelle lenze,PA- Cardiology-epic - clearance for surgery in office visit note

## 2017-09-07 NOTE — H&P (Signed)
TOTAL HIP REVISION ADMISSION H&P  Patient is admitted for left conversion from percutaneous screws to total hip arthroplasty.  Subjective:  Chief Complaint: Left hip primary OA / pain with retained orthopaedic hardware  HPI: Carrie [name], 66 y.o. female, has a history of pain and functional disability in the left hip due to arthritis and patient has failed non-surgical conservative treatments for greater than 12 weeks to include NSAID's and/or analgesics, use of assistive devices and activity modification. The indications for the conversion total hip arthroplasty are OA of the left hip with previous percutaneous screw fixation.  Onset of symptoms was gradual starting 1+ years ago with gradually worsening course since that time.  Prior procedures on the left hip include percutaneous screw fixation.  Patient currently rates pain in the left hip at 10 out of 10 with activity.  There is night pain, worsening of pain with activity and weight bearing, trendelenberg gait, pain that interfers with activities of daily living and pain with passive range of motion. Patient has evidence of OA changes with previous hardware by imaging studies.  This condition presents safety issues increasing the risk of falls.   There is no current active infection.    Risks, benefits and expectations were discussed with the patient.  Risks including but not limited to the risk of anesthesia, blood clots, nerve damage, blood vessel damage, failure of the prosthesis, infection and up to and including death.  Patient understand the risks, benefits and expectations and wishes to proceed with surgery.   PCP: Antonietta Jewel, MD  D/C Plans:       Home  Post-op Meds:       No Rx given  Tranexamic Acid:      To be given - IV   Decadron:      Is to be given  FYI:     ASA  Oxycodone 10-20  DME:   Pt already has equipment  PT:   No PT    Patient Active Problem List   Diagnosis Date Noted  . Preoperative clearance 08/22/2017   . Atrial fibrillation ([place]) 07/25/2014  . Surgical wound infection 07/21/2014  . Postoperative anemia due to acute blood loss 07/21/2014  . Elevated serum creatinine 07/21/2014  . Chronic renal insufficiency 07/21/2014  . Lower extremity edema 07/11/2014  . Preventative health care 07/11/2014  . Hypothyroid 07/04/2014  . 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 S/P AVR (aortic valve replacement) 06/28/2014  . CAD (coronary artery disease) 06/24/2014  . Aortic stenosis 06/24/2014  . Unstable angina ([place]) 06/16/2014  . Hypertensive cardiovascular disease 06/16/2014  . Bursitis of left shoulder 07/28/2011  . CANDIDIASIS, SKIN 06/18/2010  . PITYRIASIS [place]SEA 02/12/2010  . OSTEOARTHRITIS, HIP 02/12/2010  . DERMATOMYCOSIS 09/03/2009  . HYPERCHOLESTEROLEMIA 07/14/2009  . BRADYCARDIA 07/11/2009  . PALPITATIONS 07/11/2009  . CAROTID BRUIT 07/11/2009  . NECK PAIN 07/10/2009  . Essential hypertension 02/20/2009  . ARTHRITIS, LEFT SACROILIAC JOINT 02/20/2009  . ARTHRITIS, BACK 02/20/2009  . SCIATICA, ACUTE 02/20/2009   Past Medical History:  Diagnosis Date  . Carotid bruit   . Chronic lower back pain    [facility]  . Coronary artery disease    CABG 06/2014.  Normal LV function.  . DDD (degenerative disc disease), lumbar    [facility]  . Dysrhythmia   . GERD (gastroesophageal reflux disease)   . Heart murmur   . History of aortic valve replacement 06/2014   (pericardial tissue valve) for AS  . Hx of echocardiogram  07/2014   Echo (9/15):  EF 60-65%, no RWMA, mild MR, mild LAE, AVR ok (mean 15 mmHg)  . Hyperlipidemia   . Hypertension   . Hypothyroidism 06/2014   started on levothyroxine while in hospital for her CABG  . Myocardial infarction ([place])    mini  . Sinus bradycardia   . Tobacco dependence in remission     Past Surgical History:  Procedure Laterality Date  . ABDOMINAL HYSTERECTOMY  1970's  . AORTIC VALVE REPLACEMENT [name] 06/28/2014   Procedure: Aortic valve replacement using [facility] Ease 52mm valve Serial number [id number];  Surgeon: Grace Isaac, MD;  Location: [place];  Service: Open Heart Surgery;  Laterality: [name];  . CARDIAC CATHETERIZATION  06/20/2014  . CATARACT EXTRACTION W/ INTRAOCULAR LENS  IMPLANT, BILATERAL Bilateral ~ 02/2014  . CORONARY ARTERY BYPASS GRAFT [name] 06/28/2014   Procedure: Coronary artery bypass grafting on pump using left internal mammary artery to left anterior descending artery, right greater saphenous vein to right coronary artery.;  Surgeon: Grace Isaac, MD;  Location: [place];  Service: Open Heart Surgery;  Laterality: [name];  . HEEL SPUR SURGERY Right   . HIP ORIF W/ CAPSULOTOMY Left 1980's  . INTRAOPERATIVE TRANSESOPHAGEAL ECHOCARDIOGRAM [name] 06/28/2014   Procedure: INTRAOPERATIVE TRANSESOPHAGEAL ECHOCARDIOGRAM;  Surgeon: Grace Isaac, MD;  Location: [place];  Service: Open Heart Surgery;  Laterality: [name];  . LEFT HEART CATHETERIZATION WITH CORONARY ANGIOGRAM [name] 06/20/2014   Procedure: LEFT HEART CATHETERIZATION WITH CORONARY ANGIOGRAM;  Surgeon: Peter M Martinique, MD;  Location: [facility] CATH LAB;  Service: Cardiovascular;  Laterality: [name];  . MULTIPLE EXTRACTIONS WITH ALVEOLOPLASTY [name] 06/25/2014   Procedure: Extraction of tooth #'s 2,3,4,14, 18, 19 with alveoloplasty and gross debridement of remaining teeth;  Surgeon: Lenn Cal, DDS;  Location: [place];  Service: Oral Surgery;  Laterality: [name];  . TONSILLECTOMY  1978    No current facility-administered medications for this encounter.    Current Outpatient Prescriptions  Medication Sig Dispense Refill Last Dose  . amLODipine (NORVASC) 5 MG tablet Take 1 tablet (5 mg total) by mouth daily. 30 tablet 11   . atorvastatin (LIPITOR) 40 MG tablet Take 1 tablet (40 mg total) by mouth daily. 30 tablet 6 Taking  . cloNIDine (CATAPRES) 0.2 MG tablet Take 0.2 mg by mouth 3 (three) times daily.   Taking  . hydrALAZINE (APRESOLINE) 50 MG tablet Take 50 mg by mouth 2 (two) times daily.   Taking  .  levothyroxine (SYNTHROID, LEVOTHROID) 50 MCG tablet Take 1 tablet (50 mcg total) by mouth daily before breakfast. 30 tablet 3 Taking  . metoprolol (TOPROL-XL) 200 MG 24 hr tablet Take 200 mg by mouth daily.   Taking  . oxyCODONE-acetaminophen (PERCOCET) 10-325 MG tablet Take 1 tablet by mouth 3 (three) times daily.   Taking   Allergies  Allergen Reactions  . Morphine Nausea And Vomiting    ONLY IN IV FORM    Social History  Substance Use Topics  . Smoking status: Former Smoker    Packs/day: 0.75    Years: 50.00    Types: Cigarettes    Quit date: 06/22/2014  . Smokeless tobacco: Never Used     Comment: 06/20/2014 "I've been using the e-cigarettes to try to get me off the regular cigarettes"  . Alcohol use No    Family History  Problem Relation Age of Onset  . Hypertension Mother   . Heart disease Mother        CAD; needs  pacemaker75  . Stroke Mother   . Hypertension Father   . Heart disease Father 40       pacemaker  . Hyperlipidemia Brother   . Heart attack Neg Hx       Review of Systems  Constitutional: Negative.   HENT: Negative.   Eyes: Negative.   Respiratory: Negative.   Cardiovascular: Negative.   Gastrointestinal: Positive for heartburn.  Genitourinary: Negative.   Musculoskeletal: Positive for back pain and joint pain.  Skin: Negative.   Neurological: Negative.   Endo/Heme/Allergies: Negative.   Psychiatric/Behavioral: Negative.     Objective:  Physical Exam  Constitutional: She is oriented to person, place, and time. She appears well-developed.  HENT:  Head: Normocephalic.  Eyes: Pupils are equal, round, and reactive to light.  Neck: Neck supple. No JVD present. No tracheal deviation present. No thyromegaly present.  Cardiovascular: Normal rate, regular rhythm and intact distal pulses.   Murmur heard. Respiratory: Effort normal and breath sounds normal. No respiratory distress. She has no wheezes.  GI: Soft. There is no tenderness. There is no  guarding.  Musculoskeletal:       Left hip: She exhibits decreased range of motion, decreased strength, tenderness, bony tenderness and laceration (healed previous incision). She exhibits no swelling and no deformity.  Lymphadenopathy:    She has no cervical adenopathy.  Neurological: She is alert and oriented to person, place, and time. A sensory deficit (right LE neuropathy) is present.  Skin: Skin is warm and dry.  Psychiatric: She has a normal mood and affect.      Labs:  Estimated body mass index is 29.05 kg/m as calculated from the following:   Height as of 09/06/17: 5\' 6"  (1.676 m).   Weight as of 09/06/17: 81.6 kg (180 lb).  Imaging Review:  Plain radiographs demonstrate severe degenerative joint disease of the left hip(s). There is evidence of  previous percutaneous screw fixation.The bone quality appears to be good for age and reported activity level  Assessment/Plan:  End stage arthritis, left hip(s) with failed previous hip surgery.  The patient history, physical examination, clinical judgement of the provider and imaging studies are consistent with end stage degenerative joint disease of the left hip(s), previous hip surgery. Conversion to total hip arthroplasty is deemed medically necessary. The treatment options including medical management, injection therapy, arthroscopy and arthroplasty were discussed at length. The risks and benefits of total hip arthroplasty were presented and reviewed. The risks due to aseptic loosening, infection, stiffness, dislocation/subluxation,  thromboembolic complications and other imponderables were discussed.  The patient acknowledged the explanation, agreed to proceed with the plan and consent was signed. Patient is being admitted for inpatient treatment for surgery, pain control, PT, OT, prophylactic antibiotics, VTE prophylaxis, progressive ambulation and ADL's and discharge planning. The patient is planning to be discharged  home.       West Pugh Jeanetta Alonzo   PA-C  09/07/2017, 5:27 PM

## 2017-09-12 ENCOUNTER — Inpatient Hospital Stay (HOSPITAL_COMMUNITY): Payer: Medicare HMO | Admitting: Certified Registered Nurse Anesthetist

## 2017-09-12 ENCOUNTER — Encounter (HOSPITAL_COMMUNITY)
Admission: RE | Disposition: A | Discharge status: Home / Self Care | Payer: Self-pay | Source: Ambulatory Visit | Attending: Orthopedic Surgery

## 2017-09-12 ENCOUNTER — Encounter (HOSPITAL_COMMUNITY): Payer: Self-pay | Admitting: *Deleted

## 2017-09-12 ENCOUNTER — Inpatient Hospital Stay (HOSPITAL_COMMUNITY): Payer: Medicare HMO

## 2017-09-12 ENCOUNTER — Inpatient Hospital Stay (HOSPITAL_COMMUNITY)
Admission: RE | Admit: 2017-09-12 | Discharge: 2017-09-15 | DRG: 467 | Disposition: A | Discharge status: Home / Self Care | Payer: Medicare HMO | Source: Ambulatory Visit | Attending: Orthopedic Surgery | Admitting: Orthopedic Surgery

## 2017-09-12 DIAGNOSIS — I4891 Unspecified atrial fibrillation: Secondary | ICD-10-CM | POA: Diagnosis present

## 2017-09-12 DIAGNOSIS — Z952 Presence of prosthetic heart valve: Secondary | ICD-10-CM

## 2017-09-12 DIAGNOSIS — I2510 Atherosclerotic heart disease of native coronary artery without angina pectoris: Secondary | ICD-10-CM | POA: Diagnosis present

## 2017-09-12 DIAGNOSIS — M5136 Other intervertebral disc degeneration, lumbar region: Secondary | ICD-10-CM | POA: Diagnosis present

## 2017-09-12 DIAGNOSIS — M1612 Unilateral primary osteoarthritis, left hip: Principal | ICD-10-CM | POA: Diagnosis present

## 2017-09-12 DIAGNOSIS — Z79899 Other long term (current) drug therapy: Secondary | ICD-10-CM

## 2017-09-12 DIAGNOSIS — E7800 Pure hypercholesterolemia, unspecified: Secondary | ICD-10-CM | POA: Diagnosis present

## 2017-09-12 DIAGNOSIS — I350 Nonrheumatic aortic (valve) stenosis: Secondary | ICD-10-CM | POA: Diagnosis present

## 2017-09-12 DIAGNOSIS — E039 Hypothyroidism, unspecified: Secondary | ICD-10-CM | POA: Diagnosis present

## 2017-09-12 DIAGNOSIS — I10 Essential (primary) hypertension: Secondary | ICD-10-CM | POA: Diagnosis present

## 2017-09-12 DIAGNOSIS — Z9181 History of falling: Secondary | ICD-10-CM | POA: Diagnosis not present

## 2017-09-12 DIAGNOSIS — D62 Acute posthemorrhagic anemia: Secondary | ICD-10-CM | POA: Diagnosis not present

## 2017-09-12 DIAGNOSIS — Z87891 Personal history of nicotine dependence: Secondary | ICD-10-CM | POA: Diagnosis not present

## 2017-09-12 DIAGNOSIS — Z951 Presence of aortocoronary bypass graft: Secondary | ICD-10-CM

## 2017-09-12 DIAGNOSIS — R011 Cardiac murmur, unspecified: Secondary | ICD-10-CM | POA: Diagnosis present

## 2017-09-12 DIAGNOSIS — E785 Hyperlipidemia, unspecified: Secondary | ICD-10-CM | POA: Diagnosis present

## 2017-09-12 DIAGNOSIS — K219 Gastro-esophageal reflux disease without esophagitis: Secondary | ICD-10-CM | POA: Diagnosis present

## 2017-09-12 DIAGNOSIS — I252 Old myocardial infarction: Secondary | ICD-10-CM

## 2017-09-12 DIAGNOSIS — Z96649 Presence of unspecified artificial hip joint: Secondary | ICD-10-CM

## 2017-09-12 HISTORY — PX: CONVERSION TO TOTAL HIP: SHX5784

## 2017-09-12 SURGERY — CONVERSION, PREVIOUS HIP SURGERY, TO TOTAL HIP ARTHROPLASTY
Anesthesia: Monitor Anesthesia Care | Site: Hip | Laterality: Left

## 2017-09-12 MED ORDER — DEXAMETHASONE SODIUM PHOSPHATE 10 MG/ML IJ SOLN
INTRAMUSCULAR | Status: DC | PRN
  Administered 2017-09-12: 10 mg via INTRAVENOUS

## 2017-09-12 MED ORDER — FENTANYL CITRATE (PF) 100 MCG/2ML IJ SOLN
25.0000 ug | INTRAMUSCULAR | Status: DC | PRN
  Administered 2017-09-12 (×3): 50 ug via INTRAVENOUS

## 2017-09-12 MED ORDER — SODIUM CHLORIDE 0.9 % IJ SOLN
INTRAMUSCULAR | Status: AC
  Filled 2017-09-12: qty 50

## 2017-09-12 MED ORDER — ACETAMINOPHEN 650 MG RE SUPP
650.0000 mg | RECTAL | Status: DC | PRN
Start: 2017-09-12 — End: 2017-09-15

## 2017-09-12 MED ORDER — STERILE WATER FOR IRRIGATION IR SOLN
Status: DC | PRN
  Administered 2017-09-12: 2000 mL

## 2017-09-12 MED ORDER — ATORVASTATIN CALCIUM 40 MG PO TABS
40.0000 mg | ORAL_TABLET | Freq: Every day | ORAL | Status: DC
  Administered 2017-09-13 – 2017-09-15 (×3): 40 mg via ORAL
  Filled 2017-09-12 (×3): qty 1

## 2017-09-12 MED ORDER — OXYCODONE HCL 5 MG PO TABS
20.0000 mg | ORAL_TABLET | ORAL | Status: DC | PRN
  Administered 2017-09-12 – 2017-09-15 (×5): 20 mg via ORAL
  Filled 2017-09-12 (×6): qty 4

## 2017-09-12 MED ORDER — ACETAMINOPHEN 160 MG/5ML PO SOLN: 325.0000 mg | ORAL | Status: DC | PRN

## 2017-09-12 MED ORDER — PHENYLEPHRINE 40 MCG/ML (10ML) SYRINGE FOR IV PUSH (FOR BLOOD PRESSURE SUPPORT)
PREFILLED_SYRINGE | INTRAVENOUS | Status: AC
  Filled 2017-09-12: qty 10

## 2017-09-12 MED ORDER — MIDAZOLAM HCL 2 MG/2ML IJ SOLN
INTRAMUSCULAR | Status: AC
  Filled 2017-09-12: qty 2

## 2017-09-12 MED ORDER — BUPIVACAINE-EPINEPHRINE (PF) 0.25% -1:200000 IJ SOLN
INTRAMUSCULAR | Status: AC
  Filled 2017-09-12: qty 30

## 2017-09-12 MED ORDER — OXYCODONE HCL 5 MG PO TABS: 5.0000 mg | ORAL_TABLET | Freq: Once | ORAL | Status: DC | PRN

## 2017-09-12 MED ORDER — ALUM & MAG HYDROXIDE-SIMETH 200-200-20 MG/5ML PO SUSP
15.0000 mL | ORAL | Status: DC | PRN
  Administered 2017-09-13 – 2017-09-14 (×2): 15 mL via ORAL
  Filled 2017-09-12 (×2): qty 30

## 2017-09-12 MED ORDER — ACETAMINOPHEN 325 MG PO TABS
650.0000 mg | ORAL_TABLET | ORAL | Status: DC | PRN
  Administered 2017-09-12 – 2017-09-13 (×2): 650 mg via ORAL
  Filled 2017-09-12 (×2): qty 2

## 2017-09-12 MED ORDER — CEFAZOLIN SODIUM-DEXTROSE 2-4 GM/100ML-% IV SOLN
2.0000 g | Freq: Four times a day (QID) | INTRAVENOUS | Status: AC
  Administered 2017-09-12 – 2017-09-13 (×2): 2 g via INTRAVENOUS
  Filled 2017-09-12 (×2): qty 100

## 2017-09-12 MED ORDER — ONDANSETRON HCL 4 MG/2ML IJ SOLN
INTRAMUSCULAR | Status: DC | PRN
  Administered 2017-09-12: 4 mg via INTRAVENOUS

## 2017-09-12 MED ORDER — PROPOFOL 10 MG/ML IV BOLUS
INTRAVENOUS | Status: AC
  Filled 2017-09-12: qty 40

## 2017-09-12 MED ORDER — AMLODIPINE BESYLATE 5 MG PO TABS
5.0000 mg | ORAL_TABLET | Freq: Every day | ORAL | Status: DC
  Administered 2017-09-13 – 2017-09-15 (×3): 5 mg via ORAL
  Filled 2017-09-12 (×3): qty 1

## 2017-09-12 MED ORDER — BISACODYL 10 MG RE SUPP: 10.0000 mg | Freq: Every day | RECTAL | Status: DC | PRN

## 2017-09-12 MED ORDER — METOCLOPRAMIDE HCL 5 MG/ML IJ SOLN
5.0000 mg | Freq: Three times a day (TID) | INTRAMUSCULAR | Status: DC | PRN
  Administered 2017-09-13: 09:00:00 10 mg via INTRAVENOUS
  Filled 2017-09-12 (×2): qty 2

## 2017-09-12 MED ORDER — PROPOFOL 10 MG/ML IV BOLUS
INTRAVENOUS | Status: AC
  Filled 2017-09-12: qty 20

## 2017-09-12 MED ORDER — PHENYLEPHRINE 40 MCG/ML (10ML) SYRINGE FOR IV PUSH (FOR BLOOD PRESSURE SUPPORT)
PREFILLED_SYRINGE | INTRAVENOUS | Status: DC | PRN
  Administered 2017-09-12 (×5): 80 ug via INTRAVENOUS
  Administered 2017-09-12: 120 ug via INTRAVENOUS
  Administered 2017-09-12: 40 ug via INTRAVENOUS
  Administered 2017-09-12: 80 ug via INTRAVENOUS

## 2017-09-12 MED ORDER — OXYCODONE HCL 5 MG/5ML PO SOLN
5.0000 mg | Freq: Once | ORAL | Status: DC | PRN
  Filled 2017-09-12: qty 5

## 2017-09-12 MED ORDER — OXYCODONE HCL 5 MG PO TABS
10.0000 mg | ORAL_TABLET | ORAL | Status: DC | PRN
  Administered 2017-09-13 – 2017-09-15 (×12): 10 mg via ORAL
  Filled 2017-09-12 (×11): qty 2

## 2017-09-12 MED ORDER — DIPHENHYDRAMINE HCL 12.5 MG/5ML PO ELIX: 12.5000 mg | ORAL_SOLUTION | ORAL | Status: DC | PRN

## 2017-09-12 MED ORDER — DOCUSATE SODIUM 100 MG PO CAPS
100.0000 mg | ORAL_CAPSULE | Freq: Two times a day (BID) | ORAL | Status: DC
  Administered 2017-09-12 – 2017-09-15 (×6): 100 mg via ORAL
  Filled 2017-09-12 (×6): qty 1

## 2017-09-12 MED ORDER — METOCLOPRAMIDE HCL 5 MG PO TABS
5.0000 mg | ORAL_TABLET | Freq: Three times a day (TID) | ORAL | Status: DC | PRN
  Administered 2017-09-15: 12:00:00 10 mg via ORAL
  Filled 2017-09-12: qty 2

## 2017-09-12 MED ORDER — PHENOL 1.4 % MT LIQD
1.0000 | OROMUCOSAL | Status: DC | PRN
  Filled 2017-09-12: qty 177

## 2017-09-12 MED ORDER — METHOCARBAMOL 500 MG PO TABS
500.0000 mg | ORAL_TABLET | Freq: Four times a day (QID) | ORAL | Status: DC | PRN
  Administered 2017-09-12 – 2017-09-14 (×2): 500 mg via ORAL
  Filled 2017-09-12 (×2): qty 1

## 2017-09-12 MED ORDER — ACETAMINOPHEN 325 MG PO TABS
325.0000 mg | ORAL_TABLET | ORAL | Status: DC | PRN
Start: 2017-09-12 — End: 2017-09-12

## 2017-09-12 MED ORDER — ASPIRIN 81 MG PO CHEW
81.0000 mg | CHEWABLE_TABLET | Freq: Two times a day (BID) | ORAL | Status: DC
  Administered 2017-09-12 – 2017-09-15 (×6): 81 mg via ORAL
  Filled 2017-09-12 (×6): qty 1

## 2017-09-12 MED ORDER — HYDROMORPHONE HCL 1 MG/ML IJ SOLN
0.5000 mg | INTRAMUSCULAR | Status: DC | PRN
  Administered 2017-09-12: 18:00:00 0.5 mg via INTRAVENOUS
  Filled 2017-09-12: qty 1

## 2017-09-12 MED ORDER — BUPIVACAINE IN DEXTROSE 0.75-8.25 % IT SOLN
INTRATHECAL | Status: DC | PRN
  Administered 2017-09-12: 2 mL via INTRATHECAL

## 2017-09-12 MED ORDER — MIDAZOLAM HCL 5 MG/5ML IJ SOLN
INTRAMUSCULAR | Status: DC | PRN
  Administered 2017-09-12: 2 mg via INTRAVENOUS

## 2017-09-12 MED ORDER — CLONIDINE HCL 0.1 MG PO TABS
0.2000 mg | ORAL_TABLET | Freq: Three times a day (TID) | ORAL | Status: DC
  Administered 2017-09-12 – 2017-09-15 (×7): 0.2 mg via ORAL
  Filled 2017-09-12 (×7): qty 2

## 2017-09-12 MED ORDER — MENTHOL 3 MG MT LOZG
1.0000 | LOZENGE | OROMUCOSAL | Status: DC | PRN
Start: 2017-09-12 — End: 2017-09-15

## 2017-09-12 MED ORDER — LEVOTHYROXINE SODIUM 50 MCG PO TABS
50.0000 ug | ORAL_TABLET | Freq: Every day | ORAL | Status: DC
  Administered 2017-09-13 – 2017-09-15 (×3): 50 ug via ORAL
  Filled 2017-09-12 (×3): qty 1

## 2017-09-12 MED ORDER — ONDANSETRON HCL 4 MG PO TABS
4.0000 mg | ORAL_TABLET | Freq: Four times a day (QID) | ORAL | Status: DC | PRN
  Administered 2017-09-13 – 2017-09-15 (×2): 4 mg via ORAL
  Filled 2017-09-12 (×2): qty 1

## 2017-09-12 MED ORDER — ONDANSETRON HCL 4 MG/2ML IJ SOLN
INTRAMUSCULAR | Status: AC
  Filled 2017-09-12: qty 2

## 2017-09-12 MED ORDER — 0.9 % SODIUM CHLORIDE (POUR BTL) OPTIME
TOPICAL | Status: DC | PRN
  Administered 2017-09-12: 1000 mL

## 2017-09-12 MED ORDER — MAGNESIUM CITRATE PO SOLN: 1.0000 | Freq: Once | ORAL | Status: DC | PRN

## 2017-09-12 MED ORDER — PROPOFOL 500 MG/50ML IV EMUL
INTRAVENOUS | Status: DC | PRN
  Administered 2017-09-12: 75 ug/kg/min via INTRAVENOUS

## 2017-09-12 MED ORDER — TRANEXAMIC ACID 1000 MG/10ML IV SOLN
1000.0000 mg | INTRAVENOUS | Status: AC
  Administered 2017-09-12: 1000 mg via INTRAVENOUS
  Filled 2017-09-12: qty 1100

## 2017-09-12 MED ORDER — DEXAMETHASONE SODIUM PHOSPHATE 10 MG/ML IJ SOLN: 10.0000 mg | Freq: Once | INTRAMUSCULAR | Status: DC

## 2017-09-12 MED ORDER — FENTANYL CITRATE (PF) 100 MCG/2ML IJ SOLN
INTRAMUSCULAR | Status: AC
  Filled 2017-09-12: qty 2

## 2017-09-12 MED ORDER — SODIUM CHLORIDE 0.9 % IV SOLN
INTRAVENOUS | Status: DC
  Administered 2017-09-12 – 2017-09-13 (×2): via INTRAVENOUS

## 2017-09-12 MED ORDER — ONDANSETRON HCL 4 MG/2ML IJ SOLN
4.0000 mg | Freq: Four times a day (QID) | INTRAMUSCULAR | Status: DC | PRN
  Administered 2017-09-13: 08:00:00 4 mg via INTRAVENOUS
  Filled 2017-09-12: qty 2

## 2017-09-12 MED ORDER — FENTANYL CITRATE (PF) 100 MCG/2ML IJ SOLN
INTRAMUSCULAR | Status: DC | PRN
  Administered 2017-09-12: 100 ug via INTRAVENOUS

## 2017-09-12 MED ORDER — LACTATED RINGERS IV SOLN
INTRAVENOUS | Status: DC
  Administered 2017-09-12 (×3): via INTRAVENOUS

## 2017-09-12 MED ORDER — CEFAZOLIN SODIUM-DEXTROSE 2-4 GM/100ML-% IV SOLN
2.0000 g | INTRAVENOUS | Status: AC
  Administered 2017-09-12: 2 g via INTRAVENOUS

## 2017-09-12 MED ORDER — HYDRALAZINE HCL 50 MG PO TABS
50.0000 mg | ORAL_TABLET | Freq: Two times a day (BID) | ORAL | Status: DC
  Administered 2017-09-12 – 2017-09-15 (×6): 50 mg via ORAL
  Filled 2017-09-12 (×6): qty 1

## 2017-09-12 MED ORDER — TRANEXAMIC ACID 1000 MG/10ML IV SOLN
1000.0000 mg | Freq: Once | INTRAVENOUS | Status: DC
  Filled 2017-09-12 (×2): qty 10

## 2017-09-12 MED ORDER — METOPROLOL SUCCINATE ER 50 MG PO TB24
200.0000 mg | ORAL_TABLET | Freq: Every day | ORAL | Status: DC
  Administered 2017-09-13 – 2017-09-15 (×3): 200 mg via ORAL
  Filled 2017-09-12 (×3): qty 4

## 2017-09-12 MED ORDER — ALBUMIN HUMAN 5 % IV SOLN
INTRAVENOUS | Status: DC | PRN
  Administered 2017-09-12 (×2): via INTRAVENOUS

## 2017-09-12 MED ORDER — CEFAZOLIN SODIUM-DEXTROSE 2-4 GM/100ML-% IV SOLN
INTRAVENOUS | Status: AC
  Filled 2017-09-12: qty 100

## 2017-09-12 MED ORDER — CHLORHEXIDINE GLUCONATE 4 % EX LIQD: 60.0000 mL | Freq: Once | CUTANEOUS | Status: DC

## 2017-09-12 MED ORDER — POLYETHYLENE GLYCOL 3350 17 G PO PACK
17.0000 g | PACK | Freq: Two times a day (BID) | ORAL | Status: DC
  Administered 2017-09-12 – 2017-09-15 (×6): 17 g via ORAL
  Filled 2017-09-12 (×6): qty 1

## 2017-09-12 MED ORDER — KETOROLAC TROMETHAMINE 30 MG/ML IJ SOLN
INTRAMUSCULAR | Status: AC
  Filled 2017-09-12: qty 1

## 2017-09-12 MED ORDER — PROPOFOL 10 MG/ML IV BOLUS
INTRAVENOUS | Status: DC | PRN
  Administered 2017-09-12: 30 mg via INTRAVENOUS

## 2017-09-12 MED ORDER — FERROUS SULFATE 325 (65 FE) MG PO TABS
325.0000 mg | ORAL_TABLET | Freq: Three times a day (TID) | ORAL | Status: DC
  Administered 2017-09-13 – 2017-09-15 (×7): 325 mg via ORAL
  Filled 2017-09-12 (×7): qty 1

## 2017-09-12 MED ORDER — CELECOXIB 200 MG PO CAPS
200.0000 mg | ORAL_CAPSULE | Freq: Two times a day (BID) | ORAL | Status: DC
  Administered 2017-09-12 – 2017-09-15 (×6): 200 mg via ORAL
  Filled 2017-09-12 (×6): qty 1

## 2017-09-12 MED ORDER — DEXTROSE 5 % IV SOLN
500.0000 mg | Freq: Four times a day (QID) | INTRAVENOUS | Status: DC | PRN
  Administered 2017-09-12: 500 mg via INTRAVENOUS
  Filled 2017-09-12: qty 550

## 2017-09-12 MED ORDER — DEXAMETHASONE SODIUM PHOSPHATE 10 MG/ML IJ SOLN
10.0000 mg | Freq: Once | INTRAMUSCULAR | Status: AC
  Administered 2017-09-13: 10 mg via INTRAVENOUS
  Filled 2017-09-12: qty 1

## 2017-09-12 MED ORDER — EPHEDRINE SULFATE-NACL 50-0.9 MG/10ML-% IV SOSY
PREFILLED_SYRINGE | INTRAVENOUS | Status: DC | PRN
  Administered 2017-09-12 (×2): 5 mg via INTRAVENOUS

## 2017-09-12 MED ORDER — DEXAMETHASONE SODIUM PHOSPHATE 10 MG/ML IJ SOLN
INTRAMUSCULAR | Status: AC
  Filled 2017-09-12: qty 1

## 2017-09-12 SURGICAL SUPPLY — 58 items
ADH SKN CLS APL DERMABOND .7 (GAUZE/BANDAGES/DRESSINGS) ×1
BAG SPEC THK2 15X12 ZIP CLS (MISCELLANEOUS) ×1
BAG ZIPLOCK 12X15 (MISCELLANEOUS) ×3 IMPLANT
BLADE SAW SGTL 11.0X1.19X90.0M (BLADE) IMPLANT
BLADE SAW SGTL 18X1.27X75 (BLADE) ×2 IMPLANT
BLADE SAW SGTL 18X1.27X75MM (BLADE) ×1
BRUSH FEMORAL CANAL (MISCELLANEOUS) ×3 IMPLANT
CAPT HIP TOTAL 2 ×2 IMPLANT
COVER SURGICAL LIGHT HANDLE (MISCELLANEOUS) ×3 IMPLANT
DERMABOND ADVANCED (GAUZE/BANDAGES/DRESSINGS) ×2
DERMABOND ADVANCED .7 DNX12 (GAUZE/BANDAGES/DRESSINGS) IMPLANT
DRAPE C-ARM 42X120 X-RAY (DRAPES) ×2 IMPLANT
DRAPE ORTHO SPLIT 77X108 STRL (DRAPES) ×6
DRAPE POUCH INSTRU U-SHP 10X18 (DRAPES) ×3 IMPLANT
DRAPE SURG 17X11 SM STRL (DRAPES) ×3 IMPLANT
DRAPE SURG ORHT 6 SPLT 77X108 (DRAPES) ×2 IMPLANT
DRAPE U-SHAPE 47X51 STRL (DRAPES) ×3 IMPLANT
DRESSING AQUACEL AG SP 3.5X10 (GAUZE/BANDAGES/DRESSINGS) IMPLANT
DRSG AQUACEL AG SP 3.5X10 (GAUZE/BANDAGES/DRESSINGS) ×3
DRSG EMULSION OIL 3X16 NADH (GAUZE/BANDAGES/DRESSINGS) ×3 IMPLANT
DRSG MEPILEX BORDER 4X4 (GAUZE/BANDAGES/DRESSINGS) ×3 IMPLANT
DRSG MEPILEX BORDER 4X8 (GAUZE/BANDAGES/DRESSINGS) ×3 IMPLANT
DURAPREP 26ML APPLICATOR (WOUND CARE) ×3 IMPLANT
ELECT BLADE TIP CTD 4 INCH (ELECTRODE) ×3 IMPLANT
ELECT REM PT RETURN 15FT ADLT (MISCELLANEOUS) ×3 IMPLANT
FACESHIELD WRAPAROUND (MASK) ×12 IMPLANT
FACESHIELD WRAPAROUND OR TEAM (MASK) ×4 IMPLANT
GLOVE BIO SURGEON STRL SZ7 (GLOVE) ×2 IMPLANT
GLOVE BIOGEL PI IND STRL 7.0 (GLOVE) IMPLANT
GLOVE BIOGEL PI IND STRL 7.5 (GLOVE) ×1 IMPLANT
GLOVE BIOGEL PI IND STRL 8.5 (GLOVE) ×1 IMPLANT
GLOVE BIOGEL PI INDICATOR 7.0 (GLOVE) ×2
GLOVE BIOGEL PI INDICATOR 7.5 (GLOVE) ×8
GLOVE BIOGEL PI INDICATOR 8.5 (GLOVE) ×2
GLOVE ECLIPSE 8.0 STRL XLNG CF (GLOVE) ×4 IMPLANT
GLOVE ORTHO TXT STRL SZ7.5 (GLOVE) ×6 IMPLANT
GLOVE SURG SS PI 7.5 STRL IVOR (GLOVE) ×2 IMPLANT
GOWN STRL REUS W/ TWL XL LVL3 (GOWN DISPOSABLE) IMPLANT
GOWN STRL REUS W/TWL LRG LVL3 (GOWN DISPOSABLE) ×3 IMPLANT
GOWN STRL REUS W/TWL XL LVL3 (GOWN DISPOSABLE) ×9 IMPLANT
HANDPIECE INTERPULSE COAX TIP (DISPOSABLE) ×3
MANIFOLD NEPTUNE II (INSTRUMENTS) ×3 IMPLANT
POSITIONER SURGICAL ARM (MISCELLANEOUS) ×3 IMPLANT
PRESSURIZER FEMORAL UNIV (MISCELLANEOUS) ×3 IMPLANT
SET HNDPC FAN SPRY TIP SCT (DISPOSABLE) ×1 IMPLANT
SPONGE LAP 18X18 X RAY DECT (DISPOSABLE) ×3 IMPLANT
SPONGE LAP 4X18 X RAY DECT (DISPOSABLE) ×3 IMPLANT
STAPLER VISISTAT 35W (STAPLE) ×3 IMPLANT
SUCTION FRAZIER HANDLE 10FR (MISCELLANEOUS) ×2
SUCTION TUBE FRAZIER 10FR DISP (MISCELLANEOUS) ×1 IMPLANT
SUT VIC AB 1 CT1 36 (SUTURE) ×9 IMPLANT
SUT VIC AB 2-0 CT1 27 (SUTURE) ×9
SUT VIC AB 2-0 CT1 TAPERPNT 27 (SUTURE) ×3 IMPLANT
SUT VLOC 180 0 24IN GS25 (SUTURE) ×6 IMPLANT
TOWEL OR 17X26 10 PK STRL BLUE (TOWEL DISPOSABLE) ×6 IMPLANT
TOWER CARTRIDGE SMART MIX (DISPOSABLE) ×3 IMPLANT
TRAY FOLEY W/METER SILVER 16FR (SET/KITS/TRAYS/PACK) ×3 IMPLANT
YANKAUER SUCT BULB TIP 10FT TU (MISCELLANEOUS) ×2 IMPLANT

## 2017-09-12 NOTE — Anesthesia Preprocedure Evaluation (Signed)
Anesthesia Evaluation  Patient identified by MRN, date of birth, ID band Patient awake    Reviewed: Allergy & Precautions, NPO status , Patient's Chart, lab work & pertinent test results, reviewed documented beta blocker date and time   History of Anesthesia Complications Negative for: history of anesthetic complications  Airway Mallampati: II  TM Distance: >3 FB Neck ROM: Full    Dental  (+) Teeth Intact   Pulmonary neg shortness of breath, neg sleep apnea, neg recent URI, former smoker,    breath sounds clear to auscultation       Cardiovascular hypertension, Pt. on home beta blockers and Pt. on medications (-) angina+ Past MI and + CABG  (-) CAD and (-) CHF (-) dysrhythmias + Valvular Problems/Murmurs  Rhythm:Regular     Neuro/Psych neg Seizures  Neuromuscular disease    GI/Hepatic Neg liver ROS, GERD  ,  Endo/Other  Hypothyroidism   Renal/GU CRFRenal disease     Musculoskeletal  (+) Arthritis ,   Abdominal   Peds  Hematology negative hematology ROS (+)   Anesthesia Other Findings S/p CABG AVR in 2015 without current cardiac symptoms  Reproductive/Obstetrics                             Anesthesia Physical Anesthesia Plan  ASA: III  Anesthesia Plan: MAC and Spinal   Post-op Pain Management:    Induction: Intravenous  PONV Risk Score and Plan: 2 and Ondansetron and Dexamethasone  Airway Management Planned: Nasal Cannula  Additional Equipment: None  Intra-op Plan:   Post-operative Plan:   Informed Consent: I have reviewed the patients History and Physical, chart, labs and discussed the procedure including the risks, benefits and alternatives for the proposed anesthesia with the patient or authorized representative who has indicated his/her understanding and acceptance.   Dental advisory given  Plan Discussed with: CRNA and Surgeon  Anesthesia Plan Comments:          Anesthesia Quick Evaluation

## 2017-09-12 NOTE — Interval H&P Note (Signed)
History and Physical Interval Note:  09/12/2017 11:30 AM  Carrie [name]  has presented today for surgery, with the diagnosis of Left hip osteoarthritis, status post percutaneous screw fixation for femoral head fracture  The various methods of treatment have been discussed with the patient and family. After consideration of risks, benefits and other options for treatment, the patient has consented to  Procedure(s) with comments: Conversion of previous left hip surgery to left total hip arthroplasty-anterior approach (Left) - 90 mins as a surgical intervention .  The patient's history has been reviewed, patient examined, no change in status, stable for surgery.  I have reviewed the patient's chart and labs.  Questions were answered to the patient's satisfaction.     Mauri Pole

## 2017-09-12 NOTE — Brief Op Note (Signed)
09/12/2017  2:56 PM  PATIENT:  Carrie [name]  66 y.o. female  PRE-OPERATIVE DIAGNOSIS:  Left hip osteoarthritis, status post percutaneous screw fixation for femoral head fracture with retained hardware  POST-OPERATIVE DIAGNOSIS:  Left hip osteoarthritis, status post percutaneous screw fixation for femoral head fracture with retained hardware  PROCEDURE:  Procedure(s): Conversion of previous left hip surgery to left total hip arthroplasty-anterior approach (Left)  SURGEON:  Surgeon(s) and Role:    Paralee Cancel, MD - Primary  PHYSICIAN ASSISTANT: Norman Herrlich  ANESTHESIA:   spinal  EBL:  1500 mL   BLOOD ADMINISTERED:none  DRAINS: none   LOCAL MEDICATIONS USED:  NONE  SPECIMEN:  No Specimen  DISPOSITION OF SPECIMEN:  [name]  COUNTS:  YES  TOURNIQUET:  * No tourniquets in log *  DICTATION: .Other Dictation: Dictation Number [id number]  PLAN OF CARE: Admit to inpatient   PATIENT DISPOSITION:  PACU - hemodynamically stable.   Delay start of Pharmacological VTE agent (>24hrs) due to surgical blood loss or risk of bleeding: no

## 2017-09-12 NOTE — Anesthesia Procedure Notes (Signed)
Spinal  Patient location during procedure: OR End time: 09/12/2017 12:47 PM Staffing Resident/CRNA: Noralyn Pick D Performed: anesthesiologist  Preanesthetic Checklist Completed: patient identified, site marked, surgical consent, pre-op evaluation, timeout performed, IV checked, risks and benefits discussed and monitors and equipment checked Spinal Block Patient position: sitting Prep: Betadine Patient monitoring: heart rate, continuous pulse ox and blood pressure Approach: left paramedian Injection technique: single-shot Needle Needle type: Sprotte and Spinocan  Needle gauge: 24 G Needle length: 9 cm Assessment Sensory level: T6 Additional Notes Expiration date of kit checked and confirmed. Patient tolerated procedure well, without complications.

## 2017-09-12 NOTE — Anesthesia Procedure Notes (Signed)
Procedure Name: MAC Date/Time: 09/12/2017 12:38 PM Performed by: West Pugh Pre-anesthesia Checklist: Patient identified, Emergency Drugs available, Suction available, Patient being monitored and Timeout performed Patient Re-evaluated:Patient Re-evaluated prior to induction Oxygen Delivery Method: Simple face mask Placement Confirmation: positive ETCO2 Dental Injury: Teeth and Oropharynx as per pre-operative assessment

## 2017-09-12 NOTE — Transfer of Care (Signed)
Immediate Anesthesia Transfer of Care Note  Patient: Carrie [name]  Procedure(s) Performed: Procedure(s): Conversion of previous left hip surgery to left total hip arthroplasty-anterior approach (Left)  Patient Location: PACU  Anesthesia Type:MAC and Spinal  Level of Consciousness:  sedated, patient cooperative and responds to stimulation  Airway & Oxygen Therapy:Patient Spontanous Breathing and Patient connected to face mask oxgen  Post-op Assessment:  Report given to PACU RN and Post -op Vital signs reviewed and stable  Post vital signs:  Reviewed and stable  Last Vitals:  Vitals:   09/12/17 1134  BP: (!) 186/83  Pulse: (!) 51  Resp: 16  Temp: 36.7 C  SpO2: 110%    Complications: No apparent anesthesia complications

## 2017-09-13 LAB — BASIC METABOLIC PANEL
Anion gap: 9 (ref 5–15)
BUN: 21 mg/dL — AB (ref 6–20)
CO2: 24 mmol/L (ref 22–32)
Calcium: 8.3 mg/dL — ABNORMAL LOW (ref 8.9–10.3)
Chloride: 103 mmol/L (ref 101–111)
Creatinine, Ser: 1.35 mg/dL — ABNORMAL HIGH (ref 0.44–1.00)
GFR, EST AFRICAN AMERICAN: 46 mL/min — AB (ref >60)
GFR, EST NON AFRICAN AMERICAN: 39 mL/min — AB (ref >60)
Glucose, Bld: 147 mg/dL — ABNORMAL HIGH (ref 65–99)
POTASSIUM: 4.5 mmol/L (ref 3.5–5.1)
SODIUM: 136 mmol/L (ref 135–145)

## 2017-09-13 LAB — TYPE AND SCREEN
ABO/RH(D): A POS
Antibody Screen: NEGATIVE

## 2017-09-13 LAB — CBC
HEMATOCRIT: 23.3 % — AB (ref 36.0–46.0)
HEMOGLOBIN: 7.8 g/dL — AB (ref 12.0–15.0)
MCH: 30.1 pg (ref 26.0–34.0)
MCHC: 33.5 g/dL (ref 30.0–36.0)
MCV: 90 fL (ref 78.0–100.0)
PLATELETS: 167 10*3/uL (ref 150–400)
RBC: 2.59 MIL/uL — ABNORMAL LOW (ref 3.87–5.11)
RDW: 14.3 % (ref 11.5–15.5)
WBC: 13.9 10*3/uL — ABNORMAL HIGH (ref 4.0–10.5)

## 2017-09-13 NOTE — Op Note (Signed)
NAME:  Carrie [name], Carrie [name]                       ACCOUNT NO.:  MEDICAL RECORD NO.:  [id number]  LOCATION:                                 FACILITY:  PHYSICIAN:  Pietro Cassis. Alvan Dame, M.D.  DATE OF BIRTH:  12-Mar-1951  DATE OF PROCEDURE:  09/12/2017 DATE OF DISCHARGE:                              OPERATIVE REPORT   PREOPERATIVE DIAGNOSIS:  Left hip advanced osteoarthritis in the setting of previous percutaneous cannulated screw fixation for femoral neck fracture with retained hardware.  POSTOPERATIVE DIAGNOSIS:  Left hip advanced osteoarthritis in the setting of previous percutaneous cannulated screw fixation for femoral neck fracture with retained hardware.  PROCEDURE:  Conversion of failed left hip surgery to left total hip arthroplasty including removal of 3 screws, 3 washers.  COMPONENTS USED:  DePuy hip system with size 50 Pinnacle shell, 32+ 4 neutral AltrX liner, size 7 standard Tri-Lock stem with a 32+ 5 Delta ceramic ball.  SURGEON:  Pietro Cassis. Alvan Dame, M.D.  ASSISTANT:  Danae Orleans, PA-C.  Note that Mr. Guinevere Scarlet was present for the entirety of the case from preoperative position, perioperative management of the operative extremity, general facilitation of the case and primary wound closure.  ANESTHESIA:  Spinal.  SPECIMENS:  None.  COMPLICATION:  None.  BLOOD LOSS:  1500 mL.  INDICATIONS FOR PROCEDURE:  Ms. Carrie [name] is a 66 year old female with a history of a left femoral neck fracture.  She had percutaneous cannulated screw fixation of this.  She went on to unite this without complication.  She was seen and evaluated in the office for left hip osteoarthritis alone.  Perhaps the posttraumatic nature of this, it was not associated with an avascular femoral head just what appeared to be degenerative changes.  She had failed conservative measures.  Her quality of life was significantly affected by this hip pain.  She wished to proceed with surgery.  Consent was obtained.  We  reviewed the risks of infection, DVT, dislocation, need for future surgeries, potential for periprosthetic fracture after removing the hardware.  Consent was obtained for benefit of pain relief.  PROCEDURE IN DETAIL:  The patient was brought to the operative theater. Once adequate anesthesia, preoperative antibiotics, Ancef administered as well as tranexamic acid and Decadron, she was positioned supine on the Hana table.  Once adequately positioned and padded, fluoroscopy was used to orient the pelvis.  The left hip was then prepped and draped in sterile fashion using the shower curtain technique.  Old incision was identified.  Time-out was performed identifying the patient, planned procedures and the extremity.  An incision was then made beginning approximately 2 cm to 3 cm lateral to the anterior-superior iliac spine, heading in the orientation of the tensor fascia lata, muscle belly towards the trochanter.  I extended the enough so I could identify the screws in the lateral aspect of her troch.  The iliotibial band and tensor fascia were then split in line with the incision.  First, attention was directed to removal of the screws.  These screws were readily palpable.  Once we were able to identify the screw that was required screwdrivers for the screws themselves, I  was able to remove the inferior screw very easily and the anterior screw fairly easily with removal of the washer.  The posterior screw, however, was just spanning within her femoral head.  I was unable to get traction on it to back it out.  For this reason, I then exposed the hip.  Retractors were placed extra- articularly superior and inferior along the neck.  Once the pericapsular fat and circumflex vessels were cauterized and removed, T-capsulotomy was made preserving the superior and inferior leaflets.  Retractors were then placed intra-articularly.  At this point, traction was applied on the left lower  extremity.  An oscillating saw was used to create an osteotomy on the femoral neck from the trochanteric fossa down to the medial neck.  I was able to get around the screw to complete the osteotomy.  I did use an osteotome to complete this.  With the femoral neck dissociated from the femur itself, I then used the osteotome and saw to split the head longitudinally.  I was able to remove the femoral head in pieces.  At this point, I was able to mobilize the screw readily.  I returned back to the extended lateral incision to remove the screw.  I was able to push the screw lateral and grab it with some tension and then backed it out.  The washer was removed as well.  So, at this point, all 3 screws and all 3 washers were removed.  Traction was let off the leg.  Retractors were placed anterior, posterior and inferior labrum and soft tissue debrided.  The acetabulum was reamed with a 46-mm reamer to 49-mm reamer.  The 50-mm cup was then selected.  It was then impacted under fluoroscopic guidance with appeared to be appropriate amount of abduction and forward flexion.  The palpable rim of the anterior acetabulum was palpable.  Single cancellous screw was placed into the ilium.  Following irrigation, the final 32+ 4 neutral AltrX liner was impacted with good visualized rim fit.  Attention was now directed to the femur.  The lateral hook was placed underneath the vastus lateralis.  The femur was rotated to 100 degrees. The femur was elevated manually and then held in place with the hook.  The leg was then extended and adducted.  The posterior capsule was taken off the medial aspect of the trochanter.  Retractors were placed medial and proximal.  At this point, we began broaching, then broached up from a starting broach up to a size 7 broach.  I elected at this point to see where we were after calcar planing and did a trial reduction first with a high-offset neck.  With the high-offset neck  and 32+ 1 ball, I felt that offset was too much; however, leg lengths appeared to be very equal.  Given these findings, the trial femoral components were removed. I selected a size 7 standard stem.  The 7 standard Tri-Lock stem was then impacted.  It sat basically where the broach was and for this reason, I re-trialed.  I ended up selecting a 32+ 5 Delta ceramic ball, which was impacted on the clean and dried trunnion.  The hip was reduced.  The hip was irrigated throughout the case and again at this point.  At this point, I reapproximated the posterior capsule to the anterior capsule.  The iliotibial band and gluteal fascia were then reapproximated using #1 Vicryl and Stratafix suture.  The remainder of the wound was closed with 2-0 Vicryl and running  Monocryl suture.  The hip was then cleaned, dried and dressed sterilely using surgical glue and an Aquacel dressing.  She was then brought to the recovery room in stable condition tolerating the procedure well.  Findings were reviewed with family.     Pietro Cassis Alvan Dame, M.D.     MDO/MEDQ  D:  09/12/2017  T:  09/13/2017  Job:  [id number]

## 2017-09-13 NOTE — Evaluation (Signed)
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  Patient will benefit from skilled PT to increase their independence and safety with mobility to allow discharge to the venue listed below.  Pt with nausea this morning however improved upon session (pt believes from pain meds).  Pt assisted OOB to recliner.  Pt plans to d/c home with spouse.     Follow Up Recommendations DC plan and follow up therapy as arranged by surgeon    Equipment Recommendations  None recommended by PT    Recommendations for Other Services       Precautions / Restrictions Precautions Precautions: Fall Restrictions Weight Bearing Restrictions: Yes LLE Weight Bearing: Partial weight bearing LLE Partial Weight Bearing Percentage or Pounds: 50%      Mobility  Bed Mobility Overal bed mobility: Needs Assistance Bed Mobility: Supine to Sit     Supine to sit: Min assist;HOB elevated     General bed mobility comments: verbal cues for technique, assist for L LE, increased time and effort  Transfers Overall transfer level: Needs assistance Equipment used: Rolling walker (2 wheeled) Transfers: Sit to/from [facility] Sit to Stand: Min assist Stand pivot transfers: Min assist       General transfer comment: verbal cues for UE and LE positioning, educated on PWB status, pt with increased pain with standing so deferred ambulation and took a few steps to recliner  Ambulation/Gait                Stairs            Wheelchair Mobility    Modified Rankin (Stroke Patients Only)       Balance Overall balance  assessment: Needs assistance         Standing balance support: Bilateral upper extremity supported Standing balance-Leahy Scale: Poor Standing balance comment: requires UE support                             Pertinent Vitals/Pain Pain Assessment: 0-10 Pain Score: 8  Pain Location: L hip Pain Descriptors / Indicators: Sore;Aching Pain Intervention(s): Limited activity within patient's tolerance;Repositioned;Monitored during session    Home Living Family/patient expects to be discharged to:: Private residence Living Arrangements: Spouse/significant other   Type of Home: House Home Access: Stairs to enter Entrance Stairs-Rails: None Technical brewer of Steps: 1 Home Layout: Able to live on main level with bedroom/bathroom Home Equipment: Walker - 2 wheels      Prior Function Level of Independence: Independent               Hand Dominance        Extremity/Trunk Assessment        Lower Extremity Assessment Lower Extremity Assessment: LLE deficits/detail LLE Deficits / Details: anticipated post op hip weakness, required assist for bed mobility, able to perform ankle pumps       Communication   Communication: No difficulties  Cognition Arousal/Alertness: Awake/alert Behavior During Therapy: WFL for tasks assessed/performed Overall Cognitive Status: Within Functional Limits for tasks assessed  General Comments      Exercises     Assessment/Plan    PT Assessment Patient needs continued PT services  PT Problem List Decreased strength;Decreased range of motion;Decreased mobility;Decreased knowledge of precautions;Decreased knowledge of use of DME;Pain       PT Treatment Interventions Stair training;Gait training;DME instruction;Therapeutic activities;Therapeutic exercise;Patient/family education;Functional mobility training    PT Goals (Current goals can be found in the Care Plan  section)  Acute Rehab PT Goals PT Goal Formulation: With patient Time For Goal Achievement: 09/17/17 Potential to Achieve Goals: Good    Frequency 7X/week   Barriers to discharge        Co-evaluation               AM-PAC PT "6 Clicks" Daily Activity  Outcome Measure Difficulty turning over in bed (including adjusting bedclothes, sheets and blankets)?: A Lot Difficulty moving from lying on back to sitting on the side of the bed? : Unable Difficulty sitting down on and standing up from a chair with arms (e.g., wheelchair, bedside commode, etc,.)?: Unable Help needed moving to and from a bed to chair (including a wheelchair)?: A Little Help needed walking in hospital room?: A Lot Help needed climbing 3-5 steps with a railing? : Total 6 Click Score: 10    End of Session Equipment Utilized During Treatment: Gait belt Activity Tolerance: Patient limited by pain Patient left: in chair;with call bell/phone within reach (pt aware to use call bell for out of chair)   PT Visit Diagnosis: Other abnormalities of gait and mobility (R26.89);Pain Pain - Right/Left: Left Pain - part of body: Hip    Time: 2334-3568 PT Time Calculation (min) (ACUTE ONLY): 21 min   Charges:   PT Evaluation $PT Eval Low Complexity: 1 Low     PT G Codes:        Carmelia Bake, PT, DPT 09/13/2017 Pager: [phone]  York Ram E 09/13/2017, 11:43 AM

## 2017-09-13 NOTE — Progress Notes (Signed)
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   Follow Up Recommendations  DC plan and follow up therapy as arranged by surgeon     Equipment Recommendations  None recommended by PT    Recommendations for Other Services       Precautions / Restrictions Precautions Precautions: Fall Restrictions Weight Bearing Restrictions: Yes LLE Weight Bearing: Partial weight bearing LLE Partial Weight Bearing Percentage or Pounds: 50%    Mobility  Bed Mobility Overal bed mobility: Needs Assistance Bed Mobility: Supine to Sit;Sit to Supine     Supine to sit: Min assist;HOB elevated Sit to supine: Min assist   General bed mobility comments: assist for L LE  Transfers Overall transfer level: Needs assistance Equipment used: Rolling walker (2 wheeled) Transfers: Sit to/from Stand Sit to Stand: Min assist Stand pivot transfers: Min assist       General transfer comment: verbal cues for UE and LE positioning  Ambulation/Gait Ambulation/Gait assistance: Min guard Ambulation Distance (Feet): 26 Feet Assistive device: Rolling walker (2 wheeled) Gait Pattern/deviations: Step-to pattern;Decreased stance time - left;Antalgic     General Gait Details: verbal cues for sequence, RW positioning, weight bearing through UEs on RW, distance to tolerance   Stairs            Wheelchair Mobility    Modified Rankin (Stroke Patients Only)       Balance Overall balance assessment: Needs assistance         Standing balance support: Bilateral upper extremity supported Standing balance-Leahy Scale: Poor Standing balance comment: requires UE support                             Cognition Arousal/Alertness: Awake/alert Behavior During Therapy: WFL for tasks assessed/performed Overall Cognitive Status: Within Functional Limits for tasks assessed                                        Exercises Total Joint Exercises Ankle Circles/Pumps: AROM;Left;10 reps Quad Sets: AROM;Left;10 reps    General Comments        Pertinent Vitals/Pain Pain Assessment: 0-10 Pain Score: 7  Pain Location: left hip Pain Descriptors / Indicators: Aching;Sore Pain Intervention(s): Limited activity within patient's tolerance;Monitored during session;Patient requesting pain meds-RN notified;Repositioned    Home Living Family/patient expects to be discharged to:: Private residence Living Arrangements: Spouse/significant other Available Help at Discharge: Family;Available PRN/intermittently Type of Home: House Home Access: Stairs to enter Entrance Stairs-Rails: None Home Layout: Able to live on main level with bedroom/bathroom Home Equipment: Walker - 2 wheels;Bedside commode;Shower seat      Prior Function Level of Independence: Independent          PT Goals (current goals can now be found in the care plan section) Acute Rehab PT Goals Patient Stated Goal: gp home PT Goal Formulation: With patient Time For Goal Achievement: 09/17/17 Potential to Achieve Goals: Good Progress towards PT goals: Progressing toward goals    Frequency    7X/week      PT Plan Current plan remains  appropriate    Co-evaluation              AM-PAC PT "6 Clicks" Daily Activity  Outcome Measure  Difficulty turning over in bed (including adjusting bedclothes, sheets and blankets)?: A Lot Difficulty moving from lying on back to sitting on the side of the bed? : Unable Difficulty sitting down on and standing up from a chair with arms ([name].g., wheelchair, bedside commode, etc,.)?: Unable Help needed moving to and from a bed to chair (including a  wheelchair)?: A Little Help needed walking in hospital room?: A Little Help needed climbing 3-5 steps with a railing? : A Lot 6 Click Score: 12    End of Session Equipment Utilized During Treatment: Gait belt Activity Tolerance: Patient tolerated treatment well Patient left: in bed;with call bell/phone within reach;with bed alarm set Nurse Communication: Patient requests pain meds PT Visit Diagnosis: Other abnormalities of gait and mobility (R26.89) Pain - Right/Left: Left Pain - part of body: Hip     Time: 4128-7867 PT Time Calculation (min) (ACUTE ONLY): 12 min  Charges:  $Gait Training: 8-22 mins                    G Codes:       Carrie [name], PT, DPT 09/13/2017 Pager: [phone]  Carrie [name] 09/13/2017, 3:05 PM

## 2017-09-13 NOTE — Evaluation (Signed)
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 Is PWB and needs further education on performing transfers to commode, tub, and other household surfaces obeying WB precautions. Pt. Needs further OT to maximiize pt. Ability with ADLs  With use of AE, DME and obeying WB. Acute OT to follow.     Follow Up Recommendations  Home health OT    Equipment Recommendations  None recommended by OT    Recommendations for Other Services       Precautions / Restrictions Precautions Precautions: Fall Restrictions Weight Bearing Restrictions: Yes LLE Weight Bearing: Partial weight bearing LLE Partial Weight Bearing Percentage or Pounds: 50%      Mobility Bed Mobility Overal bed mobility: Needs Assistance Bed Mobility: Sit to Supine     Supine to sit: Min assist;HOB elevated Sit to supine: Mod assist   General bed mobility comments: assist to lift LE and bring into bed.  Transfers Overall transfer level: Needs assistance Equipment used: Rolling walker (2 wheeled) Transfers: Sit to/from [facility] Sit to Stand: Min assist;Mod assist Stand pivot transfers: Min assist       General transfer comment: verbal cues for UE and LE positioning, educated on PWB status, pt with increased pain with standing so deferred ambulation and took a few steps to recliner    Balance Overall balance assessment: Needs assistance         Standing balance support: Bilateral upper extremity supported Standing balance-Leahy Scale: Poor Standing balance comment: requires UE support                           ADL either performed or assessed with clinical judgement   ADL Overall ADL's : Needs  assistance/impaired Eating/Feeding: Independent   Grooming: Wash/dry hands;Wash/dry face;Supervision/safety;Sitting   Upper Body Bathing: Supervision/ safety;Sitting   Lower Body Bathing: Minimal assistance;Moderate assistance   Upper Body Dressing : Supervision/safety;Sitting   Lower Body Dressing: Moderate assistance;Sit to/from stand   Toilet Transfer: Moderate assistance;Ambulation   Toileting- Clothing Manipulation and Hygiene: Moderate assistance;Sit to/from stand       Functional mobility during ADLs: Moderate assistance;Rolling walker General ADL Comments: Pt. educated on use of AE.     Vision Baseline Vision/History: Wears glasses Wears Glasses: Reading only       Perception     Praxis      Pertinent Vitals/Pain Pain Assessment: 0-10 Pain Score: 8  Pain Location:  (l hip) Pain Descriptors / Indicators: Aching;Sore Pain Intervention(s): Limited activity within patient's tolerance     Hand Dominance     Extremity/Trunk Assessment Upper Extremity Assessment Upper Extremity Assessment: Overall WFL for tasks assessed   Lower Extremity Assessment Lower Extremity Assessment: LLE deficits/detail LLE Deficits / Details: anticipated post op hip weakness, required assist for bed mobility, able to perform ankle pumps       Communication Communication Communication: No difficulties   Cognition Arousal/Alertness: Awake/alert Behavior During Therapy: WFL for tasks assessed/performed Overall Cognitive Status: Within Functional Limits for tasks assessed                                     General Comments  Exercises     Shoulder Instructions      Home Living Family/patient expects to be discharged to:: Private residence Living Arrangements: Spouse/significant other Available Help at Discharge: Family;Available PRN/intermittently Type of Home: House Home Access: Stairs to enter [employer] of Steps: 1 Entrance  Stairs-Rails: None Home Layout: Able to live on main level with bedroom/bathroom     Bathroom Shower/Tub: Teacher, early years/pre: Standard     Home Equipment: Environmental consultant - 2 wheels;Bedside commode;Shower seat          Prior Functioning/Environment Level of Independence: Independent                 OT Problem List: Decreased activity tolerance;Decreased knowledge of use of DME or AE;Decreased knowledge of precautions      OT Treatment/Interventions: Self-care/ADL training;DME and/or AE instruction;Therapeutic activities;Patient/family education    OT Goals(Current goals can be found in the care plan section) Acute Rehab OT Goals Patient Stated Goal: gp home OT Goal Formulation: With patient Time For Goal Achievement: 09/27/17 Potential to Achieve Goals: Good ADL Goals Pt Will Perform Grooming: with modified independence;standing Pt Will Perform Lower Body Bathing: with modified independence;with adaptive equipment;sit to/from stand Pt Will Perform Lower Body Dressing: with modified independence;sit to/from stand;with adaptive equipment Pt Will Transfer to Toilet: with modified independence;bedside commode;ambulating Pt Will Perform Toileting - Clothing Manipulation and hygiene: with modified independence;sit to/from stand Pt Will Perform Tub/Shower Transfer: Tub transfer;ambulating;shower seat  OT Frequency: Min 2X/week   Barriers to D/C:            Co-evaluation              AM-PAC PT "6 Clicks" Daily Activity     Outcome Measure Help from another person eating meals?: None Help from another person taking care of personal grooming?: A Little Help from another person toileting, which includes using toliet, bedpan, or urinal?: A Lot Help from another person bathing (including washing, rinsing, drying)?: A Lot Help from another person to put on and taking off regular upper body clothing?: A Little Help from another person to put on and taking off  regular lower body clothing?: A Lot 6 Click Score: 16   End of Session Equipment Utilized During Treatment: Gait belt;Rolling walker Nurse Communication:  (ok therapy)  Activity Tolerance: Patient tolerated treatment well Patient left: in bed;with call bell/phone within reach;with bed alarm set  OT Visit Diagnosis: Unsteadiness on feet (R26.81)                Time: 1324-4010 OT Time Calculation (min): 43 min Charges:  OT General Charges $OT Visit: 1 Visit OT Treatments $Self Care/Home Management : 38-52 mins G-Codes:     6 clicks  Elveria Lauderbaugh 09/13/2017, 12:36 PM

## 2017-09-13 NOTE — Progress Notes (Signed)
Patient ID: Carrie [name], female   DOB: [birth year], 66 y.o.   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  Objective:   VITALS:   Vitals:   09/13/17 0500 09/13/17 0637  BP: (!) 151/54   Pulse: (!) 56 (!) 58  Resp: 16 (!) 26  Temp: 97.8 F (36.6 C)   SpO2: 100% 99%    Neurovascular intact Incision: dressing C/D/I  LABS  Recent Labs  09/13/17 0451  HGB 7.8*  HCT 23.3*  WBC 13.9*  PLT 167     Recent Labs  09/13/17 0451  NA 136  K 4.5  BUN 21*  CREATININE 1.35*  GLUCOSE 147*    No results for input(s): LABPT, INR in the last 72 hours.   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

## 2017-09-14 ENCOUNTER — Encounter (HOSPITAL_COMMUNITY): Payer: Self-pay | Admitting: Orthopedic Surgery

## 2017-09-14 LAB — CBC
HCT: 22.1 % — ABNORMAL LOW (ref 36.0–46.0)
Hemoglobin: 7.3 g/dL — ABNORMAL LOW (ref 12.0–15.0)
MCH: 30.2 pg (ref 26.0–34.0)
MCHC: 33 g/dL (ref 30.0–36.0)
MCV: 91.3 fL (ref 78.0–100.0)
Platelets: 150 K/uL (ref 150–400)
RBC: 2.42 MIL/uL — ABNORMAL LOW (ref 3.87–5.11)
RDW: 14.6 % (ref 11.5–15.5)
WBC: 14.2 K/uL — ABNORMAL HIGH (ref 4.0–10.5)

## 2017-09-14 LAB — BASIC METABOLIC PANEL WITH GFR
Anion gap: 7 (ref 5–15)
BUN: 26 mg/dL — ABNORMAL HIGH (ref 6–20)
CO2: 26 mmol/L (ref 22–32)
Calcium: 8.2 mg/dL — ABNORMAL LOW (ref 8.9–10.3)
Chloride: 104 mmol/L (ref 101–111)
Creatinine, Ser: 1.37 mg/dL — ABNORMAL HIGH (ref 0.44–1.00)
GFR calc Af Amer: 45 mL/min — ABNORMAL LOW
GFR calc non Af Amer: 39 mL/min — ABNORMAL LOW
Glucose, Bld: 137 mg/dL — ABNORMAL HIGH (ref 65–99)
Potassium: 4.8 mmol/L (ref 3.5–5.1)
Sodium: 137 mmol/L (ref 135–145)

## 2017-09-14 MED ORDER — ACETAMINOPHEN 325 MG PO TABS: 650.0000 mg | ORAL_TABLET | ORAL | Status: DC | PRN

## 2017-09-14 MED ORDER — ASPIRIN 81 MG PO CHEW: 81.0000 mg | CHEWABLE_TABLET | Freq: Two times a day (BID) | ORAL | 0 refills | Status: AC

## 2017-09-14 MED ORDER — OXYCODONE HCL 10 MG PO TABS: 10.0000 mg | ORAL_TABLET | ORAL | 0 refills | Status: AC | PRN

## 2017-09-14 MED ORDER — DOCUSATE SODIUM 100 MG PO CAPS: 100.0000 mg | ORAL_CAPSULE | Freq: Two times a day (BID) | ORAL | 0 refills | Status: DC

## 2017-09-14 MED ORDER — METHOCARBAMOL 500 MG PO TABS: 500.0000 mg | ORAL_TABLET | Freq: Four times a day (QID) | ORAL | 0 refills | Status: AC | PRN

## 2017-09-14 MED ORDER — TAMSULOSIN HCL 0.4 MG PO CAPS
0.4000 mg | ORAL_CAPSULE | Freq: Every day | ORAL | Status: DC
  Administered 2017-09-14 – 2017-09-15 (×2): 0.4 mg via ORAL
  Filled 2017-09-14 (×2): qty 1

## 2017-09-14 MED ORDER — POLYETHYLENE GLYCOL 3350 17 G PO PACK: 17.0000 g | PACK | Freq: Two times a day (BID) | ORAL | 0 refills | Status: DC

## 2017-09-14 MED ORDER — FERROUS SULFATE 325 (65 FE) MG PO TABS: 325.0000 mg | ORAL_TABLET | Freq: Three times a day (TID) | ORAL | 3 refills | Status: DC

## 2017-09-14 NOTE — Progress Notes (Signed)
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 Pt would need a tub bench but unable to obtain since insurance does not pay.  Pt would benefit from [facility] to address activity in the home with PWB.   Follow Up Recommendations  Home health OT    Equipment Recommendations  None recommended by OT    Recommendations for Other Services      Precautions / Restrictions Restrictions Weight Bearing Restrictions: Yes LLE Weight Bearing: Partial weight bearing LLE Partial Weight Bearing Percentage or Pounds: 50%       Mobility Bed Mobility Overal bed mobility: Needs Assistance Bed Mobility: Supine to Sit     Supine to sit: HOB elevated;Supervision     General bed mobility comments: assist for L LE  Transfers Overall transfer level: Needs assistance Equipment used: Rolling walker (2 wheeled) Transfers: Sit to/from Stand Sit to Stand: Min assist Stand pivot transfers: Min assist       General transfer comment: verbal cues for UE and LE positioning    Balance                                           ADL either performed or assessed with clinical judgement   ADL Overall ADL's : Needs assistance/impaired     Grooming: Wash/dry hands;Wash/dry face;Supervision/safety;Standing                   Toilet Transfer: Minimal assistance;RW;Ambulation;Cueing for sequencing;Cueing for safety   Toileting- Clothing Manipulation and Hygiene: Minimal assistance;Sit to/from stand;Cueing for safety;Cueing for sequencing;Cueing for compensatory techniques       Functional mobility during ADLs: Minimal assistance;Rolling walker General ADL Comments: Pt. educated on use of AE.  Husband will obtain     Vision Baseline Vision/History: No visual deficits            Cognition Arousal/Alertness: Awake/alert Behavior During Therapy: WFL for tasks assessed/performed Overall Cognitive Status: Within Functional Limits for tasks assessed                                                     Pertinent Vitals/ Pain       Pain Score: 5  Pain Location: left hip Pain Descriptors / Indicators: Aching;Sore Pain Intervention(s): Monitored during session         Frequency  Min 2X/week        Progress Toward Goals  OT Goals(current goals can now be found in the care plan section)  Progress towards OT goals: Progressing toward goals  Acute Rehab OT Goals OT Goal Formulation: With patient  Plan Discharge plan remains appropriate       AM-PAC PT "6 Clicks" Daily Activity     Outcome Measure   Help from another person eating meals?: None Help from another person taking care of personal grooming?: A Little Help from another person toileting, which includes using toliet, bedpan, or urinal?: A Little Help from another person bathing (including washing,  rinsing, drying)?: A Little Help from another person to put on and taking off regular upper body clothing?: A Little Help from another person to put on and taking off regular lower body clothing?: A Little 6 Click Score: 19    End of Session Equipment Utilized During Treatment: Gait belt;Rolling walker  OT Visit Diagnosis: Unsteadiness on feet (R26.81)   Activity Tolerance Patient tolerated treatment well   Patient Left with call bell/phone within reach;in chair   Nurse Communication  (ok therapy)        Time: 1025-1050 OT Time Calculation (min): 25 min  Charges: OT General Charges $OT Visit: 1 Visit OT Treatments $Self Care/Home Management : 23-37 mins  [place], [place] [facility]   Payton Mccallum D 09/14/2017, 11:07 AM

## 2017-09-14 NOTE — Progress Notes (Signed)
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  Will return for afternoon session to practice step and then pt likely to d/c home later today.  Follow Up Recommendations  DC plan and follow up therapy as arranged by surgeon     Equipment Recommendations  None recommended by PT    Recommendations for Other Services       Precautions / Restrictions Precautions Precautions: Fall Restrictions Weight Bearing Restrictions: Yes LLE Weight Bearing: Partial weight bearing LLE Partial Weight Bearing Percentage or Pounds: 50%    Mobility  Bed Mobility Overal bed mobility: Needs Assistance Bed Mobility: Sit to Supine     Supine to sit: HOB elevated;Supervision Sit to supine: Min assist   General bed mobility comments: assist for L LE  Transfers Overall transfer level: Needs assistance Equipment used: Rolling walker (2 wheeled) Transfers: Sit to/from Stand Sit to Stand: Min guard Stand pivot transfers: Min assist       General transfer comment: verbal cues for UE and LE positioning  Ambulation/Gait Ambulation/Gait assistance: Min guard Ambulation Distance (Feet): 50 Feet Assistive device: Rolling walker (2 wheeled) Gait Pattern/deviations: Step-to pattern;Decreased stance time - left;Antalgic     General Gait Details: verbal cues for sequence, RW positioning, weight bearing through UEs on RW, distance to tolerance   Stairs            Wheelchair Mobility    Modified Rankin (Stroke Patients Only)       Balance                                            Cognition Arousal/Alertness: Awake/alert Behavior During Therapy: WFL for tasks  assessed/performed Overall Cognitive Status: Within Functional Limits for tasks assessed                                        Exercises Total Joint Exercises Quad Sets: AROM;Left;10 reps Towel Squeeze: AROM;Both;10 reps Heel Slides: AAROM;Left;10 reps Hip ABduction/ADduction: AAROM;Left;10 reps Long Arc Quad: AROM;10 reps;Seated;Left    General Comments        Pertinent Vitals/Pain Pain Assessment: 0-10 Pain Score: 4  Pain Location: left hip Pain Descriptors / Indicators: Aching;Sore Pain Intervention(s): Limited activity within patient's tolerance;Monitored during session;Repositioned;Ice applied    Home Living                      Prior Function            PT Goals (current goals can now be found in the care plan section) Progress towards PT goals: Progressing toward goals    Frequency    7X/week      PT Plan Current plan remains appropriate    Co-evaluation              AM-PAC PT "6 Clicks" Daily Activity  Outcome Measure  Difficulty turning over in bed (including adjusting bedclothes, sheets and blankets)?: A Lot Difficulty moving from lying on back to sitting on the side  of the bed? : Unable Difficulty sitting down on and standing up from a chair with arms ([name].g., wheelchair, bedside commode, etc,.)?: Unable Help needed moving to and from a bed to chair (including a wheelchair)?: A Little Help needed walking in hospital room?: A Little Help needed climbing 3-5 steps with a railing? : A Little 6 Click Score: 13    End of Session   Activity Tolerance: Patient tolerated treatment well Patient left: in bed;with call bell/phone within reach   PT Visit Diagnosis: Other abnormalities of gait and mobility (R26.89)     Time: 5035-4656 PT Time Calculation (min) (ACUTE ONLY): 19 min  Charges:  $Therapeutic Exercise: 8-22 mins                    G Codes:       Carrie [name], PT, DPT 09/14/2017 Pager:  [phone]  Carrie [name] 09/14/2017, 12:44 PM

## 2017-09-14 NOTE — Anesthesia Postprocedure Evaluation (Signed)
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 (Left Hip)     Patient location during evaluation: PACU Anesthesia Type: MAC and Spinal Level of consciousness: awake and alert Pain management: pain level controlled Vital Signs Assessment: post-procedure vital signs reviewed and stable Respiratory status: spontaneous breathing, nonlabored ventilation, respiratory function stable and patient connected to nasal cannula oxygen Cardiovascular status: stable and blood pressure returned to baseline Postop Assessment: no apparent nausea or vomiting and spinal receding Anesthetic complications: no    Last Vitals:  Vitals:   09/13/17 2131 09/14/17 0506  BP: (!) 143/51 (!) 138/57  Pulse: (!) 59 (!) 58  Resp: 18 17  Temp: 36.8 C 36.7 C  SpO2: 98% 99%    Last Pain:  Vitals:   09/14/17 0805  TempSrc:   PainSc: 6                  Leanda Padmore

## 2017-09-14 NOTE — Progress Notes (Signed)
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  Reviewed partial weight bearing status.  Pt provided with HEP handout and verbally reviewed exercises and safety.  Pt feels ready for d/c home today.  Follow Up Recommendations  DC plan and follow up therapy as arranged by surgeon     Equipment Recommendations  None recommended by PT    Recommendations for Other Services       Precautions / Restrictions Precautions Precautions: Fall Restrictions Weight Bearing Restrictions: Yes LLE Weight Bearing: Partial weight bearing LLE Partial Weight Bearing Percentage or Pounds: 50%    Mobility  Bed Mobility Overal bed mobility: Needs Assistance Bed Mobility: Supine to Sit     Supine to sit: Min guard Sit to supine: Min assist   General bed mobility comments: verbal cues for self assist  Transfers Overall transfer level: Needs assistance Equipment used: Rolling walker (2 wheeled) Transfers: Sit to/from Stand Sit to Stand: Min guard         General transfer comment: verbal cues for UE and LE positioning  Ambulation/Gait Ambulation/Gait assistance: Min guard Ambulation Distance (Feet): 40 Feet Assistive device: Rolling walker (2 wheeled) Gait Pattern/deviations: Step-to pattern;Decreased stance time - left;Antalgic     General Gait Details: verbal cues for RW positioning, weight bearing through UEs on RW   Stairs Stairs: Yes   Stair Management: Step to pattern;Backwards;With walker Number of Stairs: 1 General stair comments: verbal cues for sequence, RW positioning and safety, pt reports understanding  Wheelchair Mobility    Modified Rankin (Stroke Patients Only)        Balance                                            Cognition Arousal/Alertness: Awake/alert Behavior During Therapy: WFL for tasks assessed/performed Overall Cognitive Status: Within Functional Limits for tasks assessed                                        Exercises    General Comments        Pertinent Vitals/Pain Pain Assessment: 0-10 Pain Score: 3  Pain Location: left hip Pain Descriptors / Indicators: Aching;Sore Pain Intervention(s): Limited activity within patient's tolerance;Monitored during session;Repositioned    Home Living                      Prior Function            PT Goals (current goals can now be found in the care plan section) Progress towards PT goals: Progressing toward goals    Frequency    7X/week      PT Plan Current plan remains appropriate    Co-evaluation              AM-PAC PT "6 Clicks" Daily Activity  Outcome Measure  Difficulty turning over in bed (including adjusting bedclothes, sheets and blankets)?: A Little Difficulty moving from lying on back to sitting on the side of the bed? : A  Little Difficulty sitting down on and standing up from a chair with arms (e.g., wheelchair, bedside commode, etc,.)?: A Little Help needed moving to and from a bed to chair (including a wheelchair)?: A Little Help needed walking in hospital room?: A Little Help needed climbing 3-5 steps with a railing? : A Little 6 Click Score: 18    End of Session   Activity Tolerance: Patient tolerated treatment well Patient left: with call bell/phone within reach;in chair Nurse Communication: Mobility status PT Visit Diagnosis: Other abnormalities of gait and mobility (R26.89)     Time: 5498-2641 PT Time Calculation (min) (ACUTE ONLY): 22 min  Charges:  $Gait Training: 8-22 mins                    G Codes:      Carmelia Bake, PT, DPT 09/14/2017 Pager: [phone]  York Ram  E 09/14/2017, 3:38 PM

## 2017-09-14 NOTE — Progress Notes (Signed)
     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 Alvan Dame. Patient reports pain as mild, pain controlled.  No events throughout the night.Ready to be discharged home if she does well with PT and pain stays controlled.   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 Follow up with OLIN,Maday Guarino D in 2 weeks.  Contact information:  [facility] [address], [address] [place]   PAC  09/14/2017, 8:31 AM

## 2017-09-14 NOTE — Progress Notes (Signed)
Discharge planning, no HH needs identified. Plan for HEP, has DME. [phone]

## 2017-09-15 NOTE — Progress Notes (Signed)
     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 No events throughout the night.  Feels that she is doing much better this morning.  SHe is having some urinary retention, but she states that this is her normal.  She is urinating as her norm.  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 Follow up with OLIN,Carrie [name] in 2 weeks.  Contact information:  [facility] [address] [place] Carrie [name]   PAC  09/15/2017, 2:09 PM

## 2017-09-15 NOTE — Progress Notes (Signed)
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  See mobility details below.  Pt ready for D/C to home.   Follow Up Recommendations  DC plan and follow up therapy as arranged by surgeon     Equipment Recommendations  None recommended by PT    Recommendations for Other Services       Precautions / Restrictions Precautions Precautions: Fall Restrictions Weight Bearing Restrictions: Yes LLE Weight Bearing: Partial weight bearing LLE Partial Weight Bearing Percentage or Pounds: 50%    Mobility  Bed Mobility Overal bed mobility: Needs Assistance Bed Mobility: Supine to Sit     Supine to sit: Min guard     General bed mobility comments: verbal cues for self assist plus increased time  Transfers Overall transfer level: Needs assistance Equipment used: Rolling walker (2 wheeled) Transfers: Sit to/from Stand Sit to Stand: Min guard;Supervision         General transfer comment: < 25% verbal cues for UE and LE positioning  Ambulation/Gait Ambulation/Gait assistance: Supervision Ambulation Distance (Feet): 38 Feet Assistive device: Rolling walker (2 wheeled) Gait Pattern/deviations: Step-to pattern;Decreased stance time - left;Antalgic Gait velocity: decreased   General Gait Details: < 25% verbal cues for RW positioning, weight bearing through UEs on RW   Stairs Stairs: Yes   Stair Management: Step to pattern;Backwards;With walker Number of Stairs: 1 General stair comments: performed twice with < 25% VC's on proper tech and freq.    Wheelchair Mobility    Modified Rankin (Stroke Patients Only)       Balance                                             Cognition Arousal/Alertness: Awake/alert Behavior During Therapy: WFL for tasks assessed/performed Overall Cognitive Status: Within Functional Limits for tasks assessed                                        Exercises   Total Hip Replacement TE's 10 reps ankle pumps 10 reps knee presses 10 reps heel slides 10 reps SAQ's 10 reps ABD Followed by ICE    General Comments        Pertinent Vitals/Pain Pain Assessment: 0-10 Pain Score: 4  Pain Location: left hip Pain Descriptors / Indicators: Aching;Sore;Operative site guarding Pain Intervention(s): Monitored during session;Repositioned;Ice applied    Home Living                      Prior Function            PT Goals (current goals can now be found in the care plan section) Progress towards PT goals: Progressing toward goals    Frequency    7X/week      PT Plan Current plan remains appropriate    Co-evaluation              AM-PAC PT "6 Clicks" Daily Activity  Outcome Measure  Difficulty turning over in bed (including adjusting bedclothes,  sheets and blankets)?: A Little Difficulty moving from lying on back to sitting on the side of the bed? : A Little Difficulty sitting down on and standing up from a chair with arms (e.g., wheelchair, bedside commode, etc,.)?: A Little Help needed moving to and from a bed to chair (including a wheelchair)?: A Little Help needed walking in hospital room?: A Little Help needed climbing 3-5 steps with a railing? : A Little 6 Click Score: 18    End of Session Equipment Utilized During Treatment: Gait belt Activity Tolerance: Patient tolerated treatment well Patient left: with call bell/phone within reach;in chair Nurse Communication: Mobility status (pt ready for D/C to home) PT Visit Diagnosis: Other abnormalities of gait and mobility (R26.89) Pain - Right/Left: Left Pain - part of body: Hip     Time: 8811-0315 PT Time Calculation  (min) (ACUTE ONLY): 28 min  Charges:  $Gait Training: 8-22 mins $Therapeutic Exercise: 8-22 mins                    G Codes:       Rica Koyanagi  PTA [facility]  Acute  Rehab Pager      [phone]

## 2017-09-15 NOTE — Care Management Important Message (Signed)
Important Message  Patient Details  Name: Carrie [name] MRN: [id number] Date of Birth: December 27, 1950   Medicare Important Message Given:  Yes    Kerin Salen 09/15/2017, 10:29 AMImportant Message  Patient Details  Name: Carrie [name] MRN: [id number] Date of Birth: 03-23-1951   Medicare Important Message Given:  Yes    Kerin Salen 09/15/2017, 10:29 AM

## 2017-09-15 NOTE — Progress Notes (Signed)
Patient discharged to home w/ family. Given all belongings, instructions, prescriptions, equipment. Escorted to pov via w/c. 

## 2017-09-20 NOTE — Discharge Summary (Signed)
Physician Discharge Summary  Patient ID: Carrie [name] MRN: [id number] DOB/AGE: 1951/06/15 66 y.o.  Admit date: 09/12/2017 Discharge date: 09/15/2017   Procedures:  Procedure(s) (LRB): Conversion of previous left hip surgery to left total hip arthroplasty-anterior approach (Left)  Attending Physician:  Dr. Paralee Cancel   Admission Diagnoses:    Left hip primary OA / pain with retained orthopaedic hardware  Discharge Diagnoses:  Principal Problem:   S/P left TH revision  Past Medical History:  Diagnosis Date  . Carotid bruit   . Chronic lower back pain    [facility]  . Coronary artery disease    CABG 06/2014.  Normal LV function.  . DDD (degenerative disc disease), lumbar    [facility]  . Dysrhythmia   . GERD (gastroesophageal reflux disease)   . Heart murmur   . History of aortic valve replacement 06/2014   (pericardial tissue valve) for AS  . Hx of echocardiogram 07/2014   Echo (9/15):  EF 60-65%, no RWMA, mild MR, mild LAE, AVR ok (mean 15 mmHg)  . Hyperlipidemia   . Hypertension   . Hypothyroidism 06/2014   started on levothyroxine while in hospital for her CABG  . Myocardial infarction ([place])    mini  . Sinus bradycardia   . Tobacco dependence in remission     HPI:    Carrie [name], 66 y.o. female, has a history of pain and functional disability in the left hip due to arthritis and patient has failed non-surgical conservative treatments for greater than 12 weeks to include NSAID's and/or analgesics, use of assistive devices and activity modification. The indications for the conversion total hip arthroplasty are OA of the left hip with previous percutaneous screw fixation. Onset of symptoms was gradual starting 1+ years ago with gradually worsening course since that time.  Prior procedures on the left hip include percutaneous screw fixation. Patient currently rates pain in the left hip at 10 out of 10 with activity.  There is night pain, worsening of pain with  activity and weight bearing, trendelenberg gait, pain that interfers with activities of daily living and pain with passive range of motion. Patient has evidence of OA changes with previous hardware by imaging studies.  This condition presents safety issues increasing the risk of falls.  There is no current active infection.    Risks, benefits and expectations were discussed with the patient.  Risks including but not limited to the risk of anesthesia, blood clots, nerve damage, blood vessel damage, failure of the prosthesis, infection and up to and including death.  Patient understand the risks, benefits and expectations and wishes to proceed with surgery.   PCP: Antonietta Jewel, MD   Discharged Condition: good  Hospital Course:  Patient underwent the above stated procedure on 09/12/2017. Patient tolerated the procedure well and brought to the recovery room in good condition and subsequently to the floor.  POD #1 BP: 151/54 ; Pulse: 56 ; Temp: 97.8 F (36.6 C) ; Resp: 16 Patient reports pain as moderate. Neurovascular intact and incision: dressing C/D/I.  LABS  Basename    HGB     7.8  HCT     23.3   POD #2  BP: 138/57 ; Pulse: 58 ; Temp: 98 F (36.7 C) ; Resp: 17 Patient reports pain as mild, pain controlled.  No events throughout the night.Ready to be discharged home if she does well with PT and pain stays controlled.  Dorsiflexion/plantar flexion intact, incision: dressing C/D/I, no cellulitis  present and compartment soft.   LABS  Basename    HGB     7.3  HCT     22.1   POD #3  BP: 123/63 ; Pulse: 61 ; Temp: 97.9 F (36.6 C) ; Resp: 16 Patient reports pain as mild, pain controlled. No events throughout the night.  Feels that she is doing much better this morning.  She is having some urinary retention, but she states that this is her normal.  She is urinating as her norm.  She feels ready to be discharged home.  Dorsiflexion/plantar flexion intact, incision: dressing C/D/I, no  cellulitis present and compartment soft.   LABS   No new labs   Discharge Exam: General appearance: alert, cooperative and no distress Extremities: Homans sign is negative, no sign of DVT, no edema, redness or tenderness in the calves or thighs and no ulcers, gangrene or trophic changes  Disposition: Home with follow up in 2 weeks   Follow-up Information    Paralee Cancel, MD. Schedule an appointment as soon as possible for a visit in 2 week(s).   Specialty:  Orthopedic Surgery Contact information: [address] [place] [address] [phone]           Discharge Instructions    Call MD / Call 911    Complete by:  As directed    If you experience chest pain or shortness of breath, CALL 911 and be transported to the hospital emergency room.  If you develope a fever above 101 F, pus (white drainage) or increased drainage or redness at the wound, or calf pain, call your surgeon's office.   Change dressing    Complete by:  As directed    Maintain surgical dressing until follow up in the clinic. If the edges start to pull up, may reinforce with tape. If the dressing is no longer working, may remove and cover with gauze and tape, but must keep the area dry and clean.  Call with any questions or concerns.   Constipation Prevention    Complete by:  As directed    Drink plenty of fluids.  Prune juice may be helpful.  You may use a stool softener, such as Colace (over the counter) 100 mg twice a day.  Use MiraLax (over the counter) for constipation as needed.   Diet - low sodium heart healthy    Complete by:  As directed    Discharge instructions    Complete by:  As directed    Maintain surgical dressing until follow up in the clinic. If the edges start to pull up, may reinforce with tape. If the dressing is no longer working, may remove and cover with gauze and tape, but must keep the area dry and clean.  Follow up in 2 weeks at [facility]. Call with any  questions or concerns.   Partial weight bearing    Complete by:  As directed    % Body Weight:  50   Laterality:  left   TED hose    Complete by:  As directed    Use stockings (TED hose) for 2 weeks on both leg(s).  You may remove them at night for sleeping.      Allergies as of 09/15/2017      Reactions   Morphine Nausea And Vomiting   ONLY IN IV FORM      Medication List    STOP taking these medications   oxyCODONE-acetaminophen 10-325 MG tablet Commonly known as:  PERCOCET     TAKE these medications   acetaminophen 325 MG tablet Commonly known as:  TYLENOL Take 2 tablets (650 mg total) by mouth every 4 (four) hours as needed for mild pain ((score 1 to 3) or temp > 100.5).   amLODipine 5 MG tablet Commonly known as:  NORVASC Take 1 tablet (5 mg total) by mouth daily.   aspirin 81 MG chewable tablet Chew 1 tablet (81 mg total) by mouth 2 (two) times daily.   atorvastatin 40 MG tablet Commonly known as:  LIPITOR Take 1 tablet (40 mg total) by mouth daily.   cloNIDine 0.2 MG tablet Commonly known as:  CATAPRES Take 0.2 mg by mouth 3 (three) times daily.   docusate sodium 100 MG capsule Commonly known as:  COLACE Take 1 capsule (100 mg total) by mouth 2 (two) times daily.   ferrous sulfate 325 (65 FE) MG tablet Take 1 tablet (325 mg total) by mouth 3 (three) times daily after meals.   hydrALAZINE 50 MG tablet Commonly known as:  APRESOLINE Take 50 mg by mouth 2 (two) times daily.   levothyroxine 50 MCG tablet Commonly known as:  SYNTHROID, LEVOTHROID Take 1 tablet (50 mcg total) by mouth daily before breakfast.   methocarbamol 500 MG tablet Commonly known as:  ROBAXIN Take 1 tablet (500 mg total) by mouth every 6 (six) hours as needed for muscle spasms.   metoprolol 200 MG 24 hr tablet Commonly known as:  TOPROL-XL Take 200 mg by mouth daily.   Oxycodone HCl 10 MG Tabs Take 1-2 tablets (10-20 mg total) by mouth every 4 (four) hours as needed for  moderate pain.   polyethylene glycol packet Commonly known as:  MIRALAX / GLYCOLAX Take 17 g by mouth 2 (two) times daily.            Discharge Care Instructions        Start     Ordered   09/14/17 0000  Change dressing    Comments:  Maintain surgical dressing until follow up in the clinic. If the edges start to pull up, may reinforce with tape. If the dressing is no longer working, may remove and cover with gauze and tape, but must keep the area dry and clean.  Call with any questions or concerns.   09/14/17 0934   09/14/17 0000  Partial weight bearing    Question Answer Comment  % Body Weight 50   Laterality left      09/14/17 2836       Signed: [place] Pugh. Jisel Fleet   PA-C  09/20/2017, 11:59 AM

## 2017-09-23 ENCOUNTER — Encounter (HOSPITAL_COMMUNITY): Payer: Medicare HMO

## 2017-10-22 DIAGNOSIS — I160 Hypertensive urgency: Secondary | ICD-10-CM

## 2017-10-22 HISTORY — DX: Hypertensive urgency: I16.0

## 2017-10-23 ENCOUNTER — Emergency Department (HOSPITAL_COMMUNITY): Payer: Medicare HMO

## 2017-10-23 ENCOUNTER — Encounter (HOSPITAL_COMMUNITY): Payer: Self-pay | Admitting: Emergency Medicine

## 2017-10-23 ENCOUNTER — Inpatient Hospital Stay (HOSPITAL_COMMUNITY)
Admission: EM | Admit: 2017-10-23 | Discharge: 2017-10-26 | DRG: 281 | Disposition: A | Discharge status: Home / Self Care | Payer: Medicare HMO | Attending: Internal Medicine | Admitting: Internal Medicine

## 2017-10-23 DIAGNOSIS — D500 Iron deficiency anemia secondary to blood loss (chronic): Secondary | ICD-10-CM | POA: Diagnosis present

## 2017-10-23 DIAGNOSIS — I2510 Atherosclerotic heart disease of native coronary artery without angina pectoris: Secondary | ICD-10-CM | POA: Diagnosis present

## 2017-10-23 DIAGNOSIS — I10 Essential (primary) hypertension: Secondary | ICD-10-CM

## 2017-10-23 DIAGNOSIS — R000 Tachycardia, unspecified: Secondary | ICD-10-CM

## 2017-10-23 DIAGNOSIS — I129 Hypertensive chronic kidney disease with stage 1 through stage 4 chronic kidney disease, or unspecified chronic kidney disease: Secondary | ICD-10-CM | POA: Diagnosis present

## 2017-10-23 DIAGNOSIS — I214 Non-ST elevation (NSTEMI) myocardial infarction: Secondary | ICD-10-CM | POA: Diagnosis not present

## 2017-10-23 DIAGNOSIS — I160 Hypertensive urgency: Secondary | ICD-10-CM | POA: Diagnosis present

## 2017-10-23 DIAGNOSIS — I359 Nonrheumatic aortic valve disorder, unspecified: Secondary | ICD-10-CM | POA: Diagnosis present

## 2017-10-23 DIAGNOSIS — F419 Anxiety disorder, unspecified: Secondary | ICD-10-CM | POA: Diagnosis present

## 2017-10-23 DIAGNOSIS — R079 Chest pain, unspecified: Secondary | ICD-10-CM

## 2017-10-23 DIAGNOSIS — I25709 Atherosclerosis of coronary artery bypass graft(s), unspecified, with unspecified angina pectoris: Secondary | ICD-10-CM | POA: Diagnosis present

## 2017-10-23 DIAGNOSIS — Z953 Presence of xenogenic heart valve: Secondary | ICD-10-CM

## 2017-10-23 DIAGNOSIS — I158 Other secondary hypertension: Secondary | ICD-10-CM | POA: Diagnosis present

## 2017-10-23 DIAGNOSIS — N183 Chronic kidney disease, stage 3 (moderate): Secondary | ICD-10-CM | POA: Diagnosis present

## 2017-10-23 DIAGNOSIS — Z8679 Personal history of other diseases of the circulatory system: Secondary | ICD-10-CM

## 2017-10-23 DIAGNOSIS — Z87891 Personal history of nicotine dependence: Secondary | ICD-10-CM

## 2017-10-23 DIAGNOSIS — Z951 Presence of aortocoronary bypass graft: Secondary | ICD-10-CM

## 2017-10-23 DIAGNOSIS — I161 Hypertensive emergency: Secondary | ICD-10-CM | POA: Diagnosis present

## 2017-10-23 DIAGNOSIS — T465X6A Underdosing of other antihypertensive drugs, initial encounter: Secondary | ICD-10-CM | POA: Diagnosis present

## 2017-10-23 DIAGNOSIS — Z885 Allergy status to narcotic agent status: Secondary | ICD-10-CM

## 2017-10-23 DIAGNOSIS — I252 Old myocardial infarction: Secondary | ICD-10-CM

## 2017-10-23 DIAGNOSIS — E039 Hypothyroidism, unspecified: Secondary | ICD-10-CM | POA: Diagnosis present

## 2017-10-23 DIAGNOSIS — E875 Hyperkalemia: Secondary | ICD-10-CM | POA: Diagnosis present

## 2017-10-23 DIAGNOSIS — M5136 Other intervertebral disc degeneration, lumbar region: Secondary | ICD-10-CM | POA: Diagnosis present

## 2017-10-23 DIAGNOSIS — M545 Low back pain: Secondary | ICD-10-CM | POA: Diagnosis present

## 2017-10-23 DIAGNOSIS — G8929 Other chronic pain: Secondary | ICD-10-CM | POA: Diagnosis present

## 2017-10-23 DIAGNOSIS — Z96642 Presence of left artificial hip joint: Secondary | ICD-10-CM | POA: Diagnosis present

## 2017-10-23 DIAGNOSIS — Z79899 Other long term (current) drug therapy: Secondary | ICD-10-CM

## 2017-10-23 DIAGNOSIS — N179 Acute kidney failure, unspecified: Secondary | ICD-10-CM | POA: Diagnosis present

## 2017-10-23 HISTORY — DX: Hypertensive urgency: I16.0

## 2017-10-23 LAB — CBC
HCT: 28.5 % — ABNORMAL LOW (ref 36.0–46.0)
Hemoglobin: 8.8 g/dL — ABNORMAL LOW (ref 12.0–15.0)
MCH: 26 pg (ref 26.0–34.0)
MCHC: 30.9 g/dL (ref 30.0–36.0)
MCV: 84.3 fL (ref 78.0–100.0)
PLATELETS: 204 10*3/uL (ref 150–400)
RBC: 3.38 MIL/uL — ABNORMAL LOW (ref 3.87–5.11)
RDW: 15.9 % — AB (ref 11.5–15.5)
WBC: 13.5 10*3/uL — AB (ref 4.0–10.5)

## 2017-10-23 LAB — BASIC METABOLIC PANEL
Anion gap: 11 (ref 5–15)
BUN: 15 mg/dL (ref 6–20)
CHLORIDE: 107 mmol/L (ref 101–111)
CO2: 21 mmol/L — ABNORMAL LOW (ref 22–32)
CREATININE: 1.09 mg/dL — AB (ref 0.44–1.00)
Calcium: 8.8 mg/dL — ABNORMAL LOW (ref 8.9–10.3)
GFR calc Af Amer: 59 mL/min — ABNORMAL LOW (ref >60)
GFR calc non Af Amer: 51 mL/min — ABNORMAL LOW (ref >60)
Glucose, Bld: 147 mg/dL — ABNORMAL HIGH (ref 65–99)
Potassium: 3.5 mmol/L (ref 3.5–5.1)
SODIUM: 139 mmol/L (ref 135–145)

## 2017-10-23 LAB — I-STAT TROPONIN, ED: Troponin i, poc: 0.01 ng/mL (ref 0.00–0.08)

## 2017-10-23 MED ORDER — FENTANYL CITRATE (PF) 100 MCG/2ML IJ SOLN
50.0000 ug | Freq: Once | INTRAMUSCULAR | Status: AC
  Administered 2017-10-24: 50 ug via INTRAVENOUS
  Filled 2017-10-23: qty 2

## 2017-10-23 MED ORDER — FENTANYL CITRATE (PF) 100 MCG/2ML IJ SOLN
50.0000 ug | Freq: Once | INTRAMUSCULAR | Status: AC
  Administered 2017-10-23: 50 ug via INTRAVENOUS
  Filled 2017-10-23: qty 2

## 2017-10-23 MED ORDER — IOPAMIDOL (ISOVUE-370) INJECTION 76%
INTRAVENOUS | Status: AC
  Administered 2017-10-23: 100 mL via INTRAVENOUS
  Filled 2017-10-23: qty 100

## 2017-10-23 NOTE — ED Triage Notes (Signed)
Pt BIB [facility] for chest pain. It started while she was laying in bed. Substernal chest pain that is radiating to her back. Pt states she had a CABG 3 years ago and feels similar. Upon EMS arrival pt was diaphoretic, nausea, and had vomited 3 times. Pt has gotten 324 ASA and 2 nitro which decreased pain from 10/10 to 8/10.

## 2017-10-23 NOTE — ED Provider Notes (Signed)
[facility] EMERGENCY DEPARTMENT Provider Note   CSN: [id number] Arrival date & time: 10/23/17  2219     History   Chief Complaint Chief Complaint  Patient presents with  . Chest Pain    HPI Carrie [name] is a 66 y.o. female with a hx of CAD, aortic valve replacement (bioprosthetic; not anticoagulated), HTN, MI s/p CABG, chronic low back pain presents to the Emergency Department complaining of acute, persistent, progressively worsening central chest pain that radiates to her back onset 8pm tonight.  Pt reports associated nausea, vomiting x2, diaphoresis and palpitations.  . No aggravating or alleviating factors.  Pt denies fever, chills, headache, neck pain, abd pain.  Pt reports no exertional symptoms, but pain is the same as her MI in 2015.   Pt did have a left total hip revision on 09/13/17 by Dr Alvan Dame.  Pt reports he pain is 10/10.  She has had ASA and Nitro x2 without relief or significant improvement.    The history is provided by the patient and medical records. No language interpreter was used.    Past Medical History:  Diagnosis Date  . Carotid bruit   . Chronic lower back pain    [facility] [facility]  . Coronary artery disease    CABG 06/2014.  Normal LV function.  . DDD (degenerative disc disease), lumbar    [facility]  . Dysrhythmia   . GERD (gastroesophageal reflux disease)   . Heart murmur   . History of aortic valve replacement 06/2014   (pericardial tissue valve) for AS  . Hx of echocardiogram 07/2014   Echo (9/15):  EF 60-65%, no RWMA, mild MR, mild LAE, AVR ok (mean 15 mmHg)  . Hyperlipidemia   . Hypertension   . Hypothyroidism 06/2014   started on levothyroxine while in hospital for her CABG  . Myocardial infarction ([place])    mini  . Sinus bradycardia   . Tobacco dependence in remission     Patient Active Problem List   Diagnosis Date Noted  . S/P left TH revision 09/12/2017  . Preoperative clearance 08/22/2017  . Atrial fibrillation ([place])  07/25/2014  . Surgical wound infection 07/21/2014  . Postoperative anemia due to acute blood loss 07/21/2014  . Elevated serum creatinine 07/21/2014  . Chronic renal insufficiency 07/21/2014  . Lower extremity edema 07/11/2014  . Preventative health care 07/11/2014  . Hypothyroid 07/04/2014  . 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 S/P AVR (aortic valve replacement) 06/28/2014  . CAD (coronary artery disease) 06/24/2014  . Aortic stenosis 06/24/2014  . Unstable angina ([place]) 06/16/2014  . Hypertensive cardiovascular disease 06/16/2014  . Bursitis of left shoulder 07/28/2011  . CANDIDIASIS, SKIN 06/18/2010  . PITYRIASIS [place] 02/12/2010  . OSTEOARTHRITIS, HIP 02/12/2010  . DERMATOMYCOSIS 09/03/2009  . HYPERCHOLESTEROLEMIA 07/14/2009  . BRADYCARDIA 07/11/2009  . PALPITATIONS 07/11/2009  . CAROTID BRUIT 07/11/2009  . NECK PAIN 07/10/2009  . Essential hypertension 02/20/2009  . ARTHRITIS, LEFT SACROILIAC JOINT 02/20/2009  . ARTHRITIS, BACK 02/20/2009  . SCIATICA, ACUTE 02/20/2009    Past Surgical History:  Procedure Laterality Date  . ABDOMINAL HYSTERECTOMY  1970's  . AORTIC VALVE REPLACEMENT [name] 06/28/2014   Procedure: Aortic valve replacement using [facility] Ease 76mm valve Serial number [id number];  Surgeon: Grace Isaac, MD;  Location: [place];  Service: Open Heart Surgery;  Laterality: [name];  . CARDIAC CATHETERIZATION  06/20/2014  . CATARACT EXTRACTION W/ INTRAOCULAR LENS  IMPLANT, BILATERAL Bilateral ~ 02/2014  . CONVERSION TO  TOTAL HIP Left 09/12/2017   Procedure: Conversion of previous left hip surgery to left total hip arthroplasty-anterior approach;  Surgeon: Paralee Cancel, MD;  Location: [facility];  Service: Orthopedics;  Laterality: Left;  . CORONARY ARTERY BYPASS GRAFT [name] 06/28/2014   Procedure: Coronary artery bypass grafting on pump using left internal mammary artery to left anterior descending artery, right greater saphenous vein to right coronary artery.;  Surgeon: Grace Isaac, MD;  Location: [place];  Service: Open Heart Surgery;  Laterality: [name];  . HEEL SPUR SURGERY Right   . HIP ORIF W/ CAPSULOTOMY Left 1980's  . INTRAOPERATIVE TRANSESOPHAGEAL ECHOCARDIOGRAM [name] 06/28/2014   Procedure: INTRAOPERATIVE TRANSESOPHAGEAL ECHOCARDIOGRAM;  Surgeon: Grace Isaac, MD;  Location: [place];  Service: Open Heart Surgery;  Laterality: [name];  . LEFT HEART CATHETERIZATION WITH CORONARY ANGIOGRAM [name] 06/20/2014   Procedure: LEFT HEART CATHETERIZATION WITH CORONARY ANGIOGRAM;  Surgeon: Peter M Martinique, MD;  Location: [facility] CATH LAB;  Service: Cardiovascular;  Laterality: [name];  . MULTIPLE EXTRACTIONS WITH ALVEOLOPLASTY [name] 06/25/2014   Procedure: Extraction of tooth #'s 2,3,4,14, 18, 19 with alveoloplasty and gross debridement of remaining teeth;  Surgeon: Lenn Cal, DDS;  Location: [place];  Service: Oral Surgery;  Laterality: [name];  . TONSILLECTOMY  1978    OB History    No data available       Home Medications    Prior to Admission medications   Medication Sig Start Date End Date Taking? Authorizing Provider  cloNIDine (CATAPRES) 0.2 MG tablet Take 0.2 mg by mouth 3 (three) times daily. 06/22/17  Yes [provider]  hydrALAZINE (APRESOLINE) 50 MG tablet Take 50 mg by mouth 2 (two) times daily. 06/22/17  Yes [provider]  levothyroxine (SYNTHROID, LEVOTHROID) 50 MCG tablet Take 1 tablet (50 mcg total) by mouth daily before breakfast. 07/06/14  Yes Barrett, Erin R, PA-C  methocarbamol (ROBAXIN) 500 MG tablet Take 1 tablet (500 mg total) by mouth every 6 (six) hours as needed for muscle spasms. 09/14/17  Yes Babish, Rodman Key, PA-C  metoprolol (TOPROL-XL) 200 MG 24 hr tablet Take 200 mg by mouth daily. 06/22/17  Yes [provider]  oxyCODONE 10 MG TABS Take 1-2 tablets (10-20 mg total) by mouth every 4 (four) hours as needed for moderate pain. 09/14/17  Yes Babish, Rodman Key, PA-C  acetaminophen (TYLENOL) 325 MG tablet Take 2 tablets (650 mg total)  by mouth every 4 (four) hours as needed for mild pain ((score 1 to 3) or temp > 100.5). Patient not taking: Reported on 10/23/2017 09/14/17   Danae Orleans, PA-C  amLODipine (NORVASC) 5 MG tablet Take 1 tablet (5 mg total) by mouth daily. Patient not taking: Reported on 10/23/2017 08/22/17 11/20/17  Imogene Burn, PA-C  atorvastatin (LIPITOR) 40 MG tablet Take 1 tablet (40 mg total) by mouth daily. Patient not taking: Reported on 10/23/2017 01/17/15   Dorothy Spark, MD  docusate sodium (COLACE) 100 MG capsule Take 1 capsule (100 mg total) by mouth 2 (two) times daily. Patient not taking: Reported on 10/23/2017 09/14/17   Danae Orleans, PA-C  ferrous sulfate 325 (65 FE) MG tablet Take 1 tablet (325 mg total) by mouth 3 (three) times daily after meals. Patient not taking: Reported on 10/23/2017 09/14/17   Danae Orleans, PA-C  polyethylene glycol (MIRALAX / Floria Raveling) packet Take 17 g by mouth 2 (two) times daily. Patient not taking: Reported on 10/23/2017 09/14/17   Danae Orleans, PA-C    Family History Family History  Problem Relation Age of Onset  . Hypertension Mother   . Heart disease Mother        CAD; needs pacemaker75  . Stroke Mother   . Hypertension Father   . Heart disease Father 41       pacemaker  . Hyperlipidemia Brother   . Heart attack Neg Hx     Social History Social History   Tobacco Use  . Smoking status: Former Smoker    Packs/day: 0.75    Years: 50.00    Pack years: 37.50    Types: Cigarettes    Last attempt to quit: 06/22/2014    Years since quitting: 3.3  . Smokeless tobacco: Never Used  . Tobacco comment: 06/20/2014 "I've been using the e-cigarettes to try to get me off the regular cigarettes"  Substance Use Topics  . Alcohol use: No  . Drug use: No     Allergies   Morphine   Review of Systems Review of Systems  Constitutional: Positive for diaphoresis. Negative for appetite change, fatigue, fever and unexpected weight change.  HENT:  Negative for mouth sores.   Eyes: Negative for visual disturbance.  Respiratory: Negative for cough, chest tightness, shortness of breath and wheezing.   Cardiovascular: Positive for chest pain.  Gastrointestinal: Positive for nausea and vomiting. Negative for abdominal pain, constipation and diarrhea.  Endocrine: Negative for polydipsia, polyphagia and polyuria.  Genitourinary: Negative for dysuria, frequency, hematuria and urgency.  Musculoskeletal: Negative for back pain and neck stiffness.  Skin: Negative for rash.  Allergic/Immunologic: Negative for immunocompromised state.  Neurological: Negative for syncope, light-headedness and headaches.  Hematological: Does not bruise/bleed easily.  Psychiatric/Behavioral: Negative for sleep disturbance. The patient is not nervous/anxious.      Physical Exam Updated Vital Signs BP (!) 189/77   Pulse (!) 104   Resp 20   SpO2 97%   Physical Exam  Constitutional: She appears well-developed and well-nourished. No distress.  Awake, alert, nontoxic appearance  HENT:  Head: Normocephalic and atraumatic.  Mouth/Throat: Oropharynx is clear and moist. No oropharyngeal exudate.  Eyes: Conjunctivae are normal. No scleral icterus.  Neck: Normal range of motion. Neck supple.  Cardiovascular: Regular rhythm and intact distal pulses. Tachycardia present.  Murmur heard. Pulses:      Radial pulses are 3+ on the right side, and 3+ on the left side.       Dorsalis pedis pulses are 2+ on the right side, and 2+ on the left side.  Pulmonary/Chest: Effort normal. Tachypnea noted. No respiratory distress. She has no wheezes.  Equal chest expansion Coarse breath sounds throughout  Abdominal: Soft. Bowel sounds are normal. She exhibits no mass. There is no tenderness. There is no rebound and no guarding.  Musculoskeletal: Normal range of motion.       Right lower leg: She exhibits edema ( 1+ bilaterally).  Neurological: She is alert.  Speech is clear and  goal oriented Moves extremities without ataxia  Skin: Skin is warm and dry. She is not diaphoretic.  Psychiatric: Her mood appears anxious.  Nursing note and vitals reviewed.    ED Treatments / Results  Labs (all labs ordered are listed, but only abnormal results are displayed) Labs Reviewed  BASIC METABOLIC PANEL - Abnormal; Notable for the following components:      Result Value   CO2 21 (*)    Glucose, Bld 147 (*)    Creatinine, Ser 1.09 (*)    Calcium 8.8 (*)    GFR calc non Af Wyvonnia Lora  51 (*)    GFR calc Af Amer 59 (*)    All other components within normal limits  CBC - Abnormal; Notable for the following components:   WBC 13.5 (*)    RBC 3.38 (*)    Hemoglobin 8.8 (*)    HCT 28.5 (*)    RDW 15.9 (*)    All other components within normal limits  I-STAT TROPONIN, ED    EKG  EKG Interpretation  Date/Time:  Sunday October 23 2017 22:30:11 EST Ventricular Rate:  100 PR Interval:    QRS Duration: 81 QT Interval:  373 QTC Calculation: 482 R Axis:   22 Text Interpretation:  Sinus tachycardia LVH with secondary repolarization abnormality Baseline wander in lead(s) V3 When compared to prior, no significant changes seen,  No STEMI Confirmed by Antony Blackbird [id number]) on 10/23/2017 10:57:55 PM       Radiology Dg Chest 2 View  Result Date: 10/23/2017 CLINICAL DATA:  Acute chest pain. EXAM: CHEST  2 VIEW COMPARISON:  08/01/2014 and prior radiographs FINDINGS: Cardiomegaly and CABG/ aortic valve replacement again noted. There is no evidence of focal airspace disease, pulmonary edema, suspicious pulmonary nodule/mass, pleural effusion, or pneumothorax. No acute bony abnormalities are identified. IMPRESSION: Cardiomegaly without evidence of acute cardiopulmonary disease. Electronically Signed   By: Margarette Canada M.D.   On: 10/23/2017 23:44   Ct Angio Chest Pe W And/or Wo Contrast  Result Date: 10/24/2017 CLINICAL DATA:  66 year old female with chest pain. EXAM: CT ANGIOGRAPHY CHEST  WITH CONTRAST TECHNIQUE: Multidetector CT imaging of the chest was performed using the standard protocol during bolus administration of intravenous contrast. Multiplanar CT image reconstructions and MIPs were obtained to evaluate the vascular anatomy. CONTRAST:  130mL ISOVUE-370 IOPAMIDOL (ISOVUE-370) INJECTION 76% COMPARISON:  Chest radiograph dated 10/23/2017 FINDINGS: Cardiovascular: There is mild cardiomegaly. No pericardial effusion. Multi vessel coronary vascular calcification with involvement of the LAD, RCA, and left circumflex artery. Mechanical aortic valve and CABG clips. There is moderate atherosclerotic calcification of the thoracic aorta. The origins of the great vessels of the aortic arch appear patent. There is no CT evidence of pulmonary embolism. Mediastinum/Nodes: No hilar or mediastinal adenopathy. There is a small hiatal hernia. The esophagus is grossly unremarkable. Lungs/Pleura: Patchy ground-glass density throughout the lungs most consistent with atelectatic changes. Mild interstitial edema is not excluded. Clinical correlation is recommended. Trace right pleural effusion noted. No focal consolidation or pneumothorax. The central airways are patent. Upper Abdomen: Slight irregularity of the hepatic contour concerning for cirrhosis. Clinical correlation is recommended. Mildly enlarged portacaval lymph node. Musculoskeletal: Degenerative changes of the spine. Median sternotomy wires. No acute osseous pathology. Review of the MIP images confirms the above findings. IMPRESSION: 1. No CT evidence of pulmonary embolism. 2. Mild cardiomegaly, coronary vascular calcification, and mechanical aortic valve. 3. Trace right pleural effusion.  No focal consolidation. 4.  Aortic Atherosclerosis (ICD10-I70.0). Electronically Signed   By: Anner Crete M.D.   On: 10/24/2017 00:27    Procedures Procedures (including critical care time)  CRITICAL CARE Performed by: Jarrett Soho Svara Twyman Total critical  care time: 50 minutes Critical care time was exclusive of separately billable procedures and treating other patients. Critical care was necessary to treat or prevent imminent or life-threatening deterioration. Critical care was time spent personally by me on the following activities: development of treatment plan with patient and/or surrogate as well as nursing, discussions with consultants, evaluation of patient's response to treatment, examination of patient, obtaining history from patient or surrogate, ordering and  performing treatments and interventions, ordering and review of laboratory studies, ordering and review of radiographic studies, pulse oximetry and re-evaluation of patient's condition.   Medications Ordered in ED Medications  hydrALAZINE (APRESOLINE) injection 10 mg (not administered)  fentaNYL (SUBLIMAZE) injection 50 mcg (50 mcg Intravenous Given 10/23/17 2329)  iopamidol (ISOVUE-370) 76 % injection (100 mLs Intravenous Contrast Given 10/23/17 2338)  fentaNYL (SUBLIMAZE) injection 50 mcg (50 mcg Intravenous Given 10/24/17 0008)     Initial Impression / Assessment and Plan / ED Course  I have reviewed the triage vital signs and the nursing notes.  Pertinent labs & imaging results that were available during my care of the patient were reviewed by me and considered in my medical decision making (see chart for details).  Clinical Course as of Oct 24 209  Mon Oct 24, 2017  0039 PCP: [facility]  [HM]  [phone] Pt is hypertensive, Hydralazine given BP: (!) 181/66 [HM]  0206 Discussed with Dr. Alcario Drought who will admit.   [HM]  7017 SBP remains in the 180s and pt continues to have pain.  Will initiate nitro drip for unstable angina.    [HM]    Clinical Course User Index [HM] Sabree Nuon, Gwenlyn Perking    Patient presents with central chest pain that radiates to her back for the last several hours.  Initial workup reassuring.  Patient is hypertensive and  tachycardic.  Recent surgery in late October and concern for possible PE.  CT chest without evidence of PE or dissection.  Patient given hydralazine for her hypertension.  Pain is decreased with 2 doses of fentanyl.  Sublingual nitroglycerin x2 did not improve patient's pain.  She was also given aspirin.  History with bioprosthetic valve and patient is not anticoagulated.  EKG without evidence of acute ischemia.  Concern for unstable angina.   Initial troponin negative.  Patient will need admission for chest pain workup.  2:11 AM Pt continues to have persistent chest pain without significant decrease in BP after hydralazine and pain control.  Will initiate nitro drip.  Discussed with hospitalist who will admit.    The patient was discussed with and seen by Dr. Sherry Ruffing who agrees with the treatment plan.   Final Clinical Impressions(s) / ED Diagnoses   Final diagnoses:  Central chest pain  Hypertension, unspecified type  Tachycardia    ED Discharge Orders    None       Agapito Games 10/24/17 [id number]    Tegeler, Gwenyth Allegra, MD 10/25/17 1345

## 2017-10-23 NOTE — ED Notes (Signed)
Patient transported to X-ray 

## 2017-10-24 ENCOUNTER — Other Ambulatory Visit: Payer: Self-pay

## 2017-10-24 DIAGNOSIS — N179 Acute kidney failure, unspecified: Secondary | ICD-10-CM | POA: Diagnosis present

## 2017-10-24 DIAGNOSIS — I2510 Atherosclerotic heart disease of native coronary artery without angina pectoris: Secondary | ICD-10-CM | POA: Diagnosis not present

## 2017-10-24 DIAGNOSIS — I214 Non-ST elevation (NSTEMI) myocardial infarction: Secondary | ICD-10-CM | POA: Diagnosis present

## 2017-10-24 DIAGNOSIS — E039 Hypothyroidism, unspecified: Secondary | ICD-10-CM | POA: Diagnosis present

## 2017-10-24 DIAGNOSIS — Z8679 Personal history of other diseases of the circulatory system: Secondary | ICD-10-CM | POA: Diagnosis not present

## 2017-10-24 DIAGNOSIS — R000 Tachycardia, unspecified: Secondary | ICD-10-CM

## 2017-10-24 DIAGNOSIS — I158 Other secondary hypertension: Secondary | ICD-10-CM | POA: Diagnosis not present

## 2017-10-24 DIAGNOSIS — I160 Hypertensive urgency: Secondary | ICD-10-CM | POA: Diagnosis not present

## 2017-10-24 DIAGNOSIS — M545 Low back pain: Secondary | ICD-10-CM | POA: Diagnosis present

## 2017-10-24 DIAGNOSIS — E875 Hyperkalemia: Secondary | ICD-10-CM | POA: Diagnosis present

## 2017-10-24 DIAGNOSIS — R079 Chest pain, unspecified: Secondary | ICD-10-CM | POA: Diagnosis not present

## 2017-10-24 DIAGNOSIS — Z953 Presence of xenogenic heart valve: Secondary | ICD-10-CM | POA: Diagnosis not present

## 2017-10-24 DIAGNOSIS — I10 Essential (primary) hypertension: Secondary | ICD-10-CM | POA: Diagnosis not present

## 2017-10-24 DIAGNOSIS — T465X6A Underdosing of other antihypertensive drugs, initial encounter: Secondary | ICD-10-CM | POA: Diagnosis present

## 2017-10-24 DIAGNOSIS — I25709 Atherosclerosis of coronary artery bypass graft(s), unspecified, with unspecified angina pectoris: Secondary | ICD-10-CM | POA: Diagnosis present

## 2017-10-24 DIAGNOSIS — Z951 Presence of aortocoronary bypass graft: Secondary | ICD-10-CM | POA: Diagnosis not present

## 2017-10-24 DIAGNOSIS — G8929 Other chronic pain: Secondary | ICD-10-CM | POA: Diagnosis present

## 2017-10-24 DIAGNOSIS — N183 Chronic kidney disease, stage 3 (moderate): Secondary | ICD-10-CM | POA: Diagnosis present

## 2017-10-24 DIAGNOSIS — M5136 Other intervertebral disc degeneration, lumbar region: Secondary | ICD-10-CM | POA: Diagnosis present

## 2017-10-24 DIAGNOSIS — I359 Nonrheumatic aortic valve disorder, unspecified: Secondary | ICD-10-CM | POA: Diagnosis present

## 2017-10-24 DIAGNOSIS — Z885 Allergy status to narcotic agent status: Secondary | ICD-10-CM | POA: Diagnosis not present

## 2017-10-24 DIAGNOSIS — I129 Hypertensive chronic kidney disease with stage 1 through stage 4 chronic kidney disease, or unspecified chronic kidney disease: Secondary | ICD-10-CM | POA: Diagnosis present

## 2017-10-24 DIAGNOSIS — I252 Old myocardial infarction: Secondary | ICD-10-CM | POA: Diagnosis not present

## 2017-10-24 DIAGNOSIS — D500 Iron deficiency anemia secondary to blood loss (chronic): Secondary | ICD-10-CM | POA: Diagnosis present

## 2017-10-24 DIAGNOSIS — Z79899 Other long term (current) drug therapy: Secondary | ICD-10-CM | POA: Diagnosis not present

## 2017-10-24 DIAGNOSIS — Z96642 Presence of left artificial hip joint: Secondary | ICD-10-CM | POA: Diagnosis present

## 2017-10-24 DIAGNOSIS — I161 Hypertensive emergency: Secondary | ICD-10-CM | POA: Diagnosis present

## 2017-10-24 DIAGNOSIS — F419 Anxiety disorder, unspecified: Secondary | ICD-10-CM | POA: Diagnosis present

## 2017-10-24 LAB — TROPONIN I
TROPONIN I: 0.24 ng/mL — AB (ref <0.03)
Troponin I: 0.07 ng/mL (ref <0.03)
Troponin I: 0.26 ng/mL (ref <0.03)

## 2017-10-24 LAB — HEPARIN LEVEL (UNFRACTIONATED): HEPARIN UNFRACTIONATED: 0.18 [IU]/mL — AB (ref 0.30–0.70)

## 2017-10-24 MED ORDER — OXYCODONE-ACETAMINOPHEN 5-325 MG PO TABS
2.0000 | ORAL_TABLET | Freq: Once | ORAL | Status: AC
  Administered 2017-10-24: 2 via ORAL
  Filled 2017-10-24: qty 2

## 2017-10-24 MED ORDER — HEPARIN BOLUS VIA INFUSION
4000.0000 [IU] | Freq: Once | INTRAVENOUS | Status: AC
  Administered 2017-10-24: 4000 [IU] via INTRAVENOUS
  Filled 2017-10-24: qty 4000

## 2017-10-24 MED ORDER — HEPARIN (PORCINE) IN NACL 100-0.45 UNIT/ML-% IJ SOLN
1200.0000 [IU]/h | INTRAMUSCULAR | Status: DC
  Administered 2017-10-24: 950 [IU]/h via INTRAVENOUS
  Filled 2017-10-24 (×2): qty 250

## 2017-10-24 MED ORDER — METHOCARBAMOL 500 MG PO TABS
500.0000 mg | ORAL_TABLET | Freq: Four times a day (QID) | ORAL | Status: DC | PRN
  Administered 2017-10-25: 500 mg via ORAL
  Filled 2017-10-24: qty 1

## 2017-10-24 MED ORDER — HYDRALAZINE HCL 50 MG PO TABS
50.0000 mg | ORAL_TABLET | Freq: Two times a day (BID) | ORAL | Status: DC
  Administered 2017-10-24 – 2017-10-26 (×5): 50 mg via ORAL
  Filled 2017-10-24 (×4): qty 1
  Filled 2017-10-24: qty 2

## 2017-10-24 MED ORDER — METOPROLOL SUCCINATE ER 100 MG PO TB24
200.0000 mg | ORAL_TABLET | Freq: Every day | ORAL | Status: DC
Start: 2017-10-24 — End: 2017-10-26
  Administered 2017-10-24 – 2017-10-26 (×3): 200 mg via ORAL
  Filled 2017-10-24 (×3): qty 2

## 2017-10-24 MED ORDER — ENOXAPARIN SODIUM 40 MG/0.4ML ~~LOC~~ SOLN
40.0000 mg | SUBCUTANEOUS | Status: DC
  Filled 2017-10-24: qty 0.4

## 2017-10-24 MED ORDER — ACETAMINOPHEN 325 MG PO TABS: 650.0000 mg | ORAL_TABLET | Freq: Four times a day (QID) | ORAL | Status: DC | PRN

## 2017-10-24 MED ORDER — CLONIDINE HCL 0.2 MG PO TABS
0.2000 mg | ORAL_TABLET | ORAL | Status: AC
  Administered 2017-10-24: 0.2 mg via ORAL
  Filled 2017-10-24: qty 1

## 2017-10-24 MED ORDER — ACETAMINOPHEN 650 MG RE SUPP: 650.0000 mg | Freq: Four times a day (QID) | RECTAL | Status: DC | PRN

## 2017-10-24 MED ORDER — LEVOTHYROXINE SODIUM 50 MCG PO TABS
50.0000 ug | ORAL_TABLET | Freq: Every day | ORAL | Status: DC
  Administered 2017-10-24 – 2017-10-26 (×3): 50 ug via ORAL
  Filled 2017-10-24 (×4): qty 1

## 2017-10-24 MED ORDER — ONDANSETRON HCL 4 MG/2ML IJ SOLN
4.0000 mg | Freq: Four times a day (QID) | INTRAMUSCULAR | Status: DC | PRN
  Administered 2017-10-25 (×2): 4 mg via INTRAVENOUS
  Filled 2017-10-24 (×2): qty 2

## 2017-10-24 MED ORDER — OXYCODONE HCL 5 MG PO TABS
10.0000 mg | ORAL_TABLET | ORAL | Status: DC | PRN
Start: 2017-10-24 — End: 2017-10-26
  Administered 2017-10-24: 20 mg via ORAL
  Administered 2017-10-24: 10 mg via ORAL
  Administered 2017-10-24 – 2017-10-26 (×9): 20 mg via ORAL
  Filled 2017-10-24 (×5): qty 4
  Filled 2017-10-24: qty 2
  Filled 2017-10-24 (×5): qty 4

## 2017-10-24 MED ORDER — ATORVASTATIN CALCIUM 40 MG PO TABS: 40.0000 mg | ORAL_TABLET | Freq: Every day | ORAL | Status: DC

## 2017-10-24 MED ORDER — CLONIDINE HCL 0.2 MG PO TABS
0.2000 mg | ORAL_TABLET | Freq: Three times a day (TID) | ORAL | Status: DC
  Administered 2017-10-24 – 2017-10-26 (×6): 0.2 mg via ORAL
  Filled 2017-10-24 (×6): qty 1

## 2017-10-24 MED ORDER — ONDANSETRON HCL 4 MG PO TABS
4.0000 mg | ORAL_TABLET | Freq: Four times a day (QID) | ORAL | Status: DC | PRN
  Administered 2017-10-24: 4 mg via ORAL
  Filled 2017-10-24: qty 1

## 2017-10-24 MED ORDER — HYDRALAZINE HCL 20 MG/ML IJ SOLN
10.0000 mg | INTRAMUSCULAR | Status: AC
  Administered 2017-10-24: 10 mg via INTRAVENOUS
  Filled 2017-10-24: qty 1

## 2017-10-24 MED ORDER — NITROGLYCERIN IN D5W 200-5 MCG/ML-% IV SOLN
0.0000 ug/min | INTRAVENOUS | Status: DC
  Administered 2017-10-24: 5 ug/min via INTRAVENOUS
  Filled 2017-10-24: qty 250

## 2017-10-24 NOTE — H&P (Signed)
History and Physical    Mayre Bury LFY:[id number] DOB: 1951-02-27 DOA: 10/23/2017  PCP: Antonietta Jewel, MD  Patient coming from: Home  I have personally briefly reviewed patient's old medical records in [place]  Chief Complaint: Chest pain  HPI: Carrie [name] is a 66 y.o. female with medical history significant of CAD and AS s/p AVR with bovine valve and 3 vessel bypass back in 2015, HTN, history of A.Fib in past doesn't appear to be on thinners.  Patient presents to the ED with c/o chest pain.  Located in central chest with radiation to back.  Like prior cardiac chest pain before surgery in 2015.  Symptoms onset at 8pm, progressively worsening, associated N/V x2.  Nothing makes better or worse.   ED Course: ASA and NTG with EMS didn't help much.  BP on arrival to ED is 200/75.  Persistently running 180s after arrival.  Got hydralazine, bp went down to 160 with some improvement in CP before rising to 180 and CP worsening again.  CTA is neg for PE and also appears to be negative for dissection.  Initial trop 0.01, second trop 0.07.  Patient put on NTG gtt with resolution of CP.  On further discussion, patient has been taking all of her home BP meds except for clonidine which she ran out of today!  Took last dose at ~8am 12 hours or so before onset of symptoms, normally takes this med 0.2mg  TID.   Review of Systems: As per HPI otherwise 10 point review of systems negative.   Past Medical History:  Diagnosis Date  . Carotid bruit   . Chronic lower back pain    [facility]  . Coronary artery disease    CABG 06/2014.  Normal LV function.  . DDD (degenerative disc disease), lumbar    [facility]  . Dysrhythmia   . GERD (gastroesophageal reflux disease)   . Heart murmur   . History of aortic valve replacement 06/2014   (pericardial tissue valve) for AS  . Hx of echocardiogram 07/2014   Echo (9/15):  EF 60-65%, no RWMA, mild MR, mild LAE, AVR ok (mean 15 mmHg)  .  Hyperlipidemia   . Hypertension   . Hypothyroidism 06/2014   started on levothyroxine while in hospital for her CABG  . Myocardial infarction ([place])    mini  . Sinus bradycardia   . Tobacco dependence in remission     Past Surgical History:  Procedure Laterality Date  . ABDOMINAL HYSTERECTOMY  1970's  . AORTIC VALVE REPLACEMENT [name] 06/28/2014   Procedure: Aortic valve replacement using [facility] Ease 110mm valve Serial number [id number];  Surgeon: Grace Isaac, MD;  Location: [facility];  Service: Open Heart Surgery;  Laterality: [name];  . CARDIAC CATHETERIZATION  06/20/2014  . CATARACT EXTRACTION W/ INTRAOCULAR LENS  IMPLANT, BILATERAL Bilateral ~ 02/2014  . CONVERSION TO TOTAL HIP Left 09/12/2017   Procedure: Conversion of previous left hip surgery to left total hip arthroplasty-anterior approach;  Surgeon: Paralee Cancel, MD;  Location: [facility];  Service: Orthopedics;  Laterality: Left;  . CORONARY ARTERY BYPASS GRAFT [name] 06/28/2014   Procedure: Coronary artery bypass grafting on pump using left internal mammary artery to left anterior descending artery, right greater saphenous vein to right coronary artery.;  Surgeon: Grace Isaac, MD;  Location: [facility];  Service: Open Heart Surgery;  Laterality: [name];  . HEEL SPUR SURGERY Right   . HIP ORIF W/ CAPSULOTOMY Left 1980's  . INTRAOPERATIVE  TRANSESOPHAGEAL ECHOCARDIOGRAM [name] 06/28/2014   Procedure: INTRAOPERATIVE TRANSESOPHAGEAL ECHOCARDIOGRAM;  Surgeon: Grace Isaac, MD;  Location: [place] [facility];  Service: Open Heart Surgery;  Laterality: [name];  . LEFT HEART CATHETERIZATION WITH CORONARY ANGIOGRAM [name] 06/20/2014   Procedure: LEFT HEART CATHETERIZATION WITH CORONARY ANGIOGRAM;  Surgeon: Peter M Martinique, MD;  Location[facility];  Service: Cardiovascular;  Laterality: [name];  . MULTIPLE EXTRACTIONS WITH ALVEOLOPLASTY [name] 06/25/2014   Procedure: Extraction of tooth #'s 2,3,4,14, 18, 19 with alveoloplasty and gross debridement of remaining teeth;  Surgeon:  Lenn Cal, DDS;  Location: [facility];  Service: Oral Surgery;  Laterality: [name];  . TONSILLECTOMY  1978     reports that she quit smoking about 3 years ago. Her smoking use included cigarettes. She has a 37.50 pack-year smoking history. she has never used smokeless tobacco. She reports that she does not drink alcohol or use drugs.  Allergies  Allergen Reactions  . Morphine Nausea And Vomiting    ONLY IN IV FORM    Family History  Problem Relation Age of Onset  . Hypertension Mother   . Heart disease Mother        CAD; needs pacemaker75  . Stroke Mother   . Hypertension Father   . Heart disease Father 37       pacemaker  . Hyperlipidemia Brother   . Heart attack Neg Hx      Prior to Admission medications   Medication Sig Start Date End Date Taking? Authorizing Provider  cloNIDine (CATAPRES) 0.2 MG tablet Take 0.2 mg by mouth 3 (three) times daily. 06/22/17  Yes [provider]  hydrALAZINE (APRESOLINE) 50 MG tablet Take 50 mg by mouth 2 (two) times daily. 06/22/17  Yes [provider]  levothyroxine (SYNTHROID, LEVOTHROID) 50 MCG tablet Take 1 tablet (50 mcg total) by mouth daily before breakfast. 07/06/14  Yes Barrett, Erin R, PA-C  methocarbamol (ROBAXIN) 500 MG tablet Take 1 tablet (500 mg total) by mouth every 6 (six) hours as needed for muscle spasms. 09/14/17  Yes Babish, Rodman Key, PA-C  metoprolol (TOPROL-XL) 200 MG 24 hr tablet Take 200 mg by mouth daily. 06/22/17  Yes [provider]  oxyCODONE 10 MG TABS Take 1-2 tablets (10-20 mg total) by mouth every 4 (four) hours as needed for moderate pain. 09/14/17  Yes Babish, Rodman Key, PA-C  atorvastatin (LIPITOR) 40 MG tablet Take 1 tablet (40 mg total) by mouth daily. Patient not taking: Reported on 10/23/2017 01/17/15   Dorothy Spark, MD    Physical Exam: Vitals:   10/24/17 0245 10/24/17 0250 10/24/17 0345 10/24/17 0400  BP: (!) 163/79 (!) 157/75 (!) 145/72 121/67  Pulse: (!) 110 (!) 117 (!) 106  100  Resp: (!) 28 (!) 25    SpO2: 97% 99% 99% 95%    Constitutional: NAD, calm, comfortable Eyes: PERRL, lids and conjunctivae normal ENMT: Mucous membranes are moist. Posterior pharynx clear of any exudate or lesions.Normal dentition.  Neck: normal, supple, no masses, no thyromegaly Respiratory: clear to auscultation bilaterally, no wheezing, no crackles. Normal respiratory effort. No accessory muscle use.  Cardiovascular: Regular rate and rhythm, no murmurs / rubs / gallops. No extremity edema. 2+ pedal pulses. No carotid bruits.  Abdomen: no tenderness, no masses palpated. No hepatosplenomegaly. Bowel sounds positive.  Musculoskeletal: no clubbing / cyanosis. No joint deformity upper and lower extremities. Good ROM, no contractures. Normal muscle tone.  Skin: no rashes, lesions, ulcers. No induration Neurologic: CN 2-12 grossly intact. Sensation intact, DTR normal.  Strength 5/5 in all 4.  Psychiatric: Normal judgment and insight. Alert and oriented x 3. Normal mood.    Labs on Admission: I have personally reviewed following labs and imaging studies  CBC: Recent Labs  Lab 10/23/17 2238  WBC 13.5*  HGB 8.8*  HCT 28.5*  MCV 84.3  PLT 810   Basic Metabolic Panel: Recent Labs  Lab 10/23/17 2238  NA 139  K 3.5  CL 107  CO2 21*  GLUCOSE 147*  BUN 15  CREATININE 1.09*  CALCIUM 8.8*   GFR: CrCl cannot be calculated (Unknown ideal weight.). Liver Function Tests: No results for input(s): AST, ALT, ALKPHOS, BILITOT, PROT, ALBUMIN in the last 168 hours. No results for input(s): LIPASE, AMYLASE in the last 168 hours. No results for input(s): AMMONIA in the last 168 hours. Coagulation Profile: No results for input(s): INR, PROTIME in the last 168 hours. Cardiac Enzymes: Recent Labs  Lab 10/24/17 0132  TROPONINI 0.07*   BNP (last 3 results) No results for input(s): PROBNP in the last 8760 hours. HbA1C: No results for input(s): HGBA1C in the last 72 hours. CBG: No  results for input(s): GLUCAP in the last 168 hours. Lipid Profile: No results for input(s): CHOL, HDL, LDLCALC, TRIG, CHOLHDL, LDLDIRECT in the last 72 hours. Thyroid Function Tests: No results for input(s): TSH, T4TOTAL, FREET4, T3FREE, THYROIDAB in the last 72 hours. Anemia Panel: No results for input(s): VITAMINB12, FOLATE, FERRITIN, TIBC, IRON, RETICCTPCT in the last 72 hours. Urine analysis:    Component Value Date/Time   COLORURINE YELLOW 07/03/2014 1228   APPEARANCEUR CLEAR 07/03/2014 1228   LABSPEC 1.014 07/03/2014 1228   PHURINE 6.0 07/03/2014 1228   GLUCOSEU NEGATIVE 07/03/2014 1228   HGBUR NEGATIVE 07/03/2014 1228   HGBUR negative 08/27/2009 [place] 07/03/2014 1228   BILIRUBINUR neg 05/24/2014 1428   KETONESUR NEGATIVE 07/03/2014 1228   PROTEINUR NEGATIVE 07/03/2014 1228   UROBILINOGEN 1.0 07/03/2014 1228   NITRITE NEGATIVE 07/03/2014 1228   LEUKOCYTESUR NEGATIVE 07/03/2014 1228    Radiological Exams on Admission: Dg Chest 2 View  Result Date: 10/23/2017 CLINICAL DATA:  Acute chest pain. EXAM: CHEST  2 VIEW COMPARISON:  08/01/2014 and prior radiographs FINDINGS: Cardiomegaly and CABG/ aortic valve replacement again noted. There is no evidence of focal airspace disease, pulmonary edema, suspicious pulmonary nodule/mass, pleural effusion, or pneumothorax. No acute bony abnormalities are identified. IMPRESSION: Cardiomegaly without evidence of acute cardiopulmonary disease. Electronically Signed   By: Margarette Canada M.D.   On: 10/23/2017 23:44   Ct Angio Chest Pe W And/or Wo Contrast  Result Date: 10/24/2017 CLINICAL DATA:  66 year old female with chest pain. EXAM: CT ANGIOGRAPHY CHEST WITH CONTRAST TECHNIQUE: Multidetector CT imaging of the chest was performed using the standard protocol during bolus administration of intravenous contrast. Multiplanar CT image reconstructions and MIPs were obtained to evaluate the vascular anatomy. CONTRAST:  137mL  ISOVUE-370 IOPAMIDOL (ISOVUE-370) INJECTION 76% COMPARISON:  Chest radiograph dated 10/23/2017 FINDINGS: Cardiovascular: There is mild cardiomegaly. No pericardial effusion. Multi vessel coronary vascular calcification with involvement of the LAD, RCA, and left circumflex artery. Mechanical aortic valve and CABG clips. There is moderate atherosclerotic calcification of the thoracic aorta. The origins of the great vessels of the aortic arch appear patent. There is no CT evidence of pulmonary embolism. Mediastinum/Nodes: No hilar or mediastinal adenopathy. There is a small hiatal hernia. The esophagus is grossly unremarkable. Lungs/Pleura: Patchy ground-glass density throughout the lungs most consistent with atelectatic changes. Mild interstitial edema is not  excluded. Clinical correlation is recommended. Trace right pleural effusion noted. No focal consolidation or pneumothorax. The central airways are patent. Upper Abdomen: Slight irregularity of the hepatic contour concerning for cirrhosis. Clinical correlation is recommended. Mildly enlarged portacaval lymph node. Musculoskeletal: Degenerative changes of the spine. Median sternotomy wires. No acute osseous pathology. Review of the MIP images confirms the above findings. IMPRESSION: 1. No CT evidence of pulmonary embolism. 2. Mild cardiomegaly, coronary vascular calcification, and mechanical aortic valve. 3. Trace right pleural effusion.  No focal consolidation. 4.  Aortic Atherosclerosis (ICD10-I70.0). Electronically Signed   By: Anner Crete M.D.   On: 10/24/2017 00:27    EKG: Independently reviewed.  Assessment/Plan Principal Problem:   Hypertensive urgency Active Problems:   CAD (coronary artery disease)   Rebound hypertension    1. HTN urgency - likely due to rebound hypertension due to running out of clonidine 1. BP looks much better after dose of 0.2mg  PO clonidine 1. Resume clonidine 2. Explained to patient what rebound hypertension  was 2. Trying to wean the NTG gtt off now 3. Serial trops 4. Tele monitor 5. Will go ahead and let patient eat 6. Continue other home BP meds 2. CAD - s/p 3x CABG in 2015 1. Continue home meds 2. Will resume statin which apparently got stopped because her "colesterol looked good" to her PCP.  (not due to reaction to statin).  DVT prophylaxis: Lovenox Code Status: Full Family Communication: No family in room Disposition Plan: Home after admit Consults called: None Admission status: Place in obs   [place], [place] [facility] Pager [phone]  If 7AM-7PM, please contact day team taking care of patient [email or web address] Password [email or web address]  10/24/2017, 5:08 AM

## 2017-10-24 NOTE — ED Notes (Signed)
Purewick placed on pt, tolerated well.

## 2017-10-24 NOTE — ED Notes (Signed)
Critical troponin received and provided to EDP [facility]

## 2017-10-24 NOTE — ED Notes (Signed)
Delay in lab draw nurse assisting pt at this time.

## 2017-10-24 NOTE — Progress Notes (Signed)
Pt is a 66 yo CF with PMH significant for CAD s/p CABG, AS s/p AVR with bovine valve, previous a-fib not on anticoagulation, HTN who presented to ED 10/23/17 and admitted today on 10/24/17 with complaints of chest pain to r/o ACS. Chest pain persisted in the ED. Hypertensive emergency with troponin rising from 0.07 to 0.24. Missed 2 doses of clonidine, SBP in the 200's in the ED. EKG 10/24/17 showed ST T depression lead I, V4, V5, V6. Drew another set of troponin and started heparin drip until cardiology evaluates.   Continue close monitoring on telemetry. On nitro drip.

## 2017-10-24 NOTE — ED Notes (Signed)
Hospitalist bedside for evaluation and planning 

## 2017-10-24 NOTE — ED Notes (Signed)
Provider notified of patients request for pain medication. Orders received

## 2017-10-24 NOTE — ED Notes (Signed)
Spoke with attending MD regarding pt's elevated troponin. Plan to initiate heparin drip and monitor troponin trend. Repeat troponin drawn and sent to lab at this time

## 2017-10-24 NOTE — ED Notes (Signed)
Pt eating breakfast 

## 2017-10-24 NOTE — ED Notes (Signed)
Heart Healthy Diet Ordered  

## 2017-10-24 NOTE — Progress Notes (Signed)
ANTICOAGULATION CONSULT NOTE - Initial Consult  Pharmacy Consult for heparin Indication: chest pain/ACS  Allergies  Allergen Reactions  . Morphine Nausea And Vomiting    ONLY IN IV FORM    Patient Measurements: Height: 5\' 6"  (167.6 cm) Weight: 180 lb 16 oz (82.1 kg) IBW/kg (Calculated) : 59.3 Heparin Dosing Weight: 76.5kg  Vital Signs: BP: 136/70 (12/03 1400) Pulse Rate: 83 (12/03 1400)  Labs: Recent Labs    10/23/17 2238 10/24/17 0132 10/24/17 1123  HGB 8.8*  --   --   HCT 28.5*  --   --   PLT 204  --   --   CREATININE 1.09*  --   --   TROPONINI  --  0.07* 0.24*    Estimated Creatinine Clearance: 53.3 mL/min (A) (by C-G formula based on SCr of 1.09 mg/dL (H)).   Medical History: Past Medical History:  Diagnosis Date  . Carotid bruit   . Chronic lower back pain    [facility]  . Coronary artery disease    CABG 06/2014.  Normal LV function.  . DDD (degenerative disc disease), lumbar    [facility]  . Dysrhythmia   . GERD (gastroesophageal reflux disease)   . Heart murmur   . History of aortic valve replacement 06/2014   (pericardial tissue valve) for AS  . Hx of echocardiogram 07/2014   Echo (9/15):  EF 60-65%, no RWMA, mild MR, mild LAE, AVR ok (mean 15 mmHg)  . Hyperlipidemia   . Hypertension   . Hypothyroidism 06/2014   started on levothyroxine while in hospital for her CABG  . Myocardial infarction ([place])    mini  . Sinus bradycardia   . Tobacco dependence in remission     Medications:  Infusions:  . heparin    . nitroGLYCERIN 25 mcg/min (10/24/17 1018)    Assessment: 86 yof presented to the ED with CP. Troponin trending up. To start IV heparin. Baseline H/H slightly low and platelets are WNL. She is not on anticoagulation PTA.   Goal of Therapy:  Heparin level 0.3-0.7 units/ml Monitor platelets by anticoagulation protocol: Yes   Plan:  Heparin bolus 4000 units IV x 1 Heparin gtt 950 units/hr Check a 6 hr heparin level Daily heparin level and  CBC  Thea Holshouser, Rande Lawman 10/24/2017,2:32 PM

## 2017-10-25 ENCOUNTER — Encounter (HOSPITAL_COMMUNITY): Payer: Self-pay | Admitting: General Practice

## 2017-10-25 DIAGNOSIS — I214 Non-ST elevation (NSTEMI) myocardial infarction: Principal | ICD-10-CM

## 2017-10-25 DIAGNOSIS — I160 Hypertensive urgency: Secondary | ICD-10-CM

## 2017-10-25 LAB — CBC
HCT: 27 % — ABNORMAL LOW (ref 36.0–46.0)
HEMATOCRIT: 28.1 % — AB (ref 36.0–46.0)
HEMOGLOBIN: 8 g/dL — AB (ref 12.0–15.0)
Hemoglobin: 8.3 g/dL — ABNORMAL LOW (ref 12.0–15.0)
MCH: 25.5 pg — ABNORMAL LOW (ref 26.0–34.0)
MCH: 25.8 pg — AB (ref 26.0–34.0)
MCHC: 29.5 g/dL — AB (ref 30.0–36.0)
MCHC: 29.6 g/dL — AB (ref 30.0–36.0)
MCV: 86.5 fL (ref 78.0–100.0)
MCV: 87.1 fL (ref 78.0–100.0)
PLATELETS: 245 10*3/uL (ref 150–400)
PLATELETS: 233 10*3/uL (ref 150–400)
RBC: 3.25 MIL/uL — ABNORMAL LOW (ref 3.87–5.11)
RBC: 3.1 MIL/uL — ABNORMAL LOW (ref 3.87–5.11)
RDW: 16.6 % — AB (ref 11.5–15.5)
RDW: 16.8 % — ABNORMAL HIGH (ref 11.5–15.5)
WBC: 10.7 10*3/uL — ABNORMAL HIGH (ref 4.0–10.5)
WBC: 9.5 10*3/uL (ref 4.0–10.5)

## 2017-10-25 LAB — BASIC METABOLIC PANEL
ANION GAP: 7 (ref 5–15)
Anion gap: 10 (ref 5–15)
Anion gap: 12 (ref 5–15)
Anion gap: 8 (ref 5–15)
BUN: 22 mg/dL — ABNORMAL HIGH (ref 6–20)
BUN: 23 mg/dL — AB (ref 6–20)
BUN: 24 mg/dL — ABNORMAL HIGH (ref 6–20)
BUN: 23 mg/dL — AB (ref 6–20)
CALCIUM: 9 mg/dL (ref 8.9–10.3)
CALCIUM: 8.9 mg/dL (ref 8.9–10.3)
CALCIUM: 8.3 mg/dL — AB (ref 8.9–10.3)
CO2: 25 mmol/L (ref 22–32)
CO2: 21 mmol/L — ABNORMAL LOW (ref 22–32)
CO2: 25 mmol/L (ref 22–32)
CO2: 21 mmol/L — AB (ref 22–32)
CREATININE: 1.8 mg/dL — AB (ref 0.44–1.00)
CREATININE: 1.88 mg/dL — AB (ref 0.44–1.00)
Calcium: 9.1 mg/dL (ref 8.9–10.3)
Chloride: 102 mmol/L (ref 101–111)
Chloride: 104 mmol/L (ref 101–111)
Chloride: 105 mmol/L (ref 101–111)
Chloride: 105 mmol/L (ref 101–111)
Creatinine, Ser: 1.87 mg/dL — ABNORMAL HIGH (ref 0.44–1.00)
Creatinine, Ser: 1.6 mg/dL — ABNORMAL HIGH (ref 0.44–1.00)
GFR calc Af Amer: 31 mL/min — ABNORMAL LOW (ref >60)
GFR calc non Af Amer: 26 mL/min — ABNORMAL LOW (ref >60)
GFR, EST AFRICAN AMERICAN: 32 mL/min — AB (ref >60)
GFR, EST AFRICAN AMERICAN: 31 mL/min — AB (ref >60)
GFR, EST AFRICAN AMERICAN: 37 mL/min — AB (ref >60)
GFR, EST NON AFRICAN AMERICAN: 28 mL/min — AB (ref >60)
GFR, EST NON AFRICAN AMERICAN: 27 mL/min — AB (ref >60)
GFR, EST NON AFRICAN AMERICAN: 32 mL/min — AB (ref >60)
GLUCOSE: 118 mg/dL — AB (ref 65–99)
GLUCOSE: 100 mg/dL — AB (ref 65–99)
GLUCOSE: 108 mg/dL — AB (ref 65–99)
Glucose, Bld: 114 mg/dL — ABNORMAL HIGH (ref 65–99)
POTASSIUM: 5.2 mmol/L — AB (ref 3.5–5.1)
POTASSIUM: 4.9 mmol/L (ref 3.5–5.1)
Potassium: 5.6 mmol/L — ABNORMAL HIGH (ref 3.5–5.1)
Potassium: 6 mmol/L — ABNORMAL HIGH (ref 3.5–5.1)
SODIUM: 137 mmol/L (ref 135–145)
SODIUM: 134 mmol/L — AB (ref 135–145)
Sodium: 137 mmol/L (ref 135–145)
Sodium: 137 mmol/L (ref 135–145)

## 2017-10-25 LAB — MRSA PCR SCREENING: MRSA by PCR: NEGATIVE

## 2017-10-25 LAB — HEPARIN LEVEL (UNFRACTIONATED)
HEPARIN UNFRACTIONATED: 0.2 [IU]/mL — AB (ref 0.30–0.70)
Heparin Unfractionated: 0.44 IU/mL (ref 0.30–0.70)

## 2017-10-25 MED ORDER — ASPIRIN 81 MG PO CHEW: 81.0000 mg | CHEWABLE_TABLET | ORAL | Status: DC

## 2017-10-25 MED ORDER — ASPIRIN 81 MG PO CHEW
81.0000 mg | CHEWABLE_TABLET | ORAL | Status: AC
  Administered 2017-10-25: 81 mg via ORAL
  Filled 2017-10-25: qty 1

## 2017-10-25 MED ORDER — HEPARIN BOLUS VIA INFUSION
2000.0000 [IU] | Freq: Once | INTRAVENOUS | Status: AC
  Administered 2017-10-25: 2000 [IU] via INTRAVENOUS
  Filled 2017-10-25: qty 2000

## 2017-10-25 MED ORDER — SODIUM POLYSTYRENE SULFONATE 15 GM/60ML PO SUSP
30.0000 g | ORAL | Status: AC
  Administered 2017-10-25: 30 g via ORAL
  Filled 2017-10-25: qty 120

## 2017-10-25 MED ORDER — HEPARIN (PORCINE) IN NACL 100-0.45 UNIT/ML-% IJ SOLN
1200.0000 [IU]/h | INTRAMUSCULAR | Status: DC
  Filled 2017-10-25: qty 250

## 2017-10-25 MED ORDER — ATORVASTATIN CALCIUM 40 MG PO TABS
40.0000 mg | ORAL_TABLET | Freq: Every day | ORAL | Status: DC
  Administered 2017-10-25 – 2017-10-26 (×2): 40 mg via ORAL
  Filled 2017-10-25 (×2): qty 1

## 2017-10-25 MED ORDER — SODIUM CHLORIDE 0.9% FLUSH
3.0000 mL | Freq: Two times a day (BID) | INTRAVENOUS | Status: DC
  Administered 2017-10-25 – 2017-10-26 (×3): 3 mL via INTRAVENOUS

## 2017-10-25 MED ORDER — SODIUM POLYSTYRENE SULFONATE 15 GM/60ML PO SUSP: 30.0000 g | Freq: Once | ORAL | Status: DC

## 2017-10-25 MED ORDER — HEPARIN BOLUS VIA INFUSION
1100.0000 [IU] | Freq: Once | INTRAVENOUS | Status: AC
  Administered 2017-10-25: 1100 [IU] via INTRAVENOUS
  Filled 2017-10-25: qty 1100

## 2017-10-25 MED ORDER — HEPARIN (PORCINE) IN NACL 100-0.45 UNIT/ML-% IJ SOLN
1400.0000 [IU]/h | INTRAMUSCULAR | Status: DC
  Administered 2017-10-25: 1200 [IU]/h via INTRAVENOUS
  Administered 2017-10-26: 1400 [IU]/h via INTRAVENOUS
  Filled 2017-10-25: qty 250

## 2017-10-25 MED ORDER — ASPIRIN 81 MG PO CHEW
81.0000 mg | CHEWABLE_TABLET | ORAL | Status: AC
  Administered 2017-10-26: 81 mg via ORAL
  Filled 2017-10-25: qty 1

## 2017-10-25 MED ORDER — SODIUM CHLORIDE 0.9% FLUSH: 3.0000 mL | INTRAVENOUS | Status: DC | PRN

## 2017-10-25 MED ORDER — SODIUM CHLORIDE 0.9 % IV SOLN
INTRAVENOUS | Status: DC
  Administered 2017-10-25 – 2017-10-26 (×3): via INTRAVENOUS

## 2017-10-25 MED ORDER — SODIUM CHLORIDE 0.9 % IV SOLN: 250.0000 mL | INTRAVENOUS | Status: DC | PRN

## 2017-10-25 NOTE — Consult Note (Signed)
Cardiology Consultation:   Patient ID: Mellie Buccellato; [id number]; Dec 16, 1950   Admit date: 10/23/2017 Date of Consult: 10/25/2017  Primary Care Provider: Antonietta Jewel, MD Primary Cardiologist: Dr. Meda Coffee Primary Electrophysiologist:  Dr. Rayann Heman (seen in 2010 for palpitations)   Patient Profile:   Aristea Posada is a 66 y.o. female with a hx of CAD and aortic stenosis s/p CABG + AVR in 2015, post operative afib at time of CABG, HTN, HLD and bilateral carotid artery stenosis, admitted by IM for chest pain, in the setting of hypertensive urgency.   History of Present Illness:   Pt is followed by Dr. Meda Coffee. In 2015, she underwent CABG x 2 for CAD s/p LIMA-LAD and SVG-RCA. EF was normal at 60-65%. At time of CABG, she also underwent AVR w/ bioprosthetic tissue valve for severe AS. Post operative course was complicated by atrial fibrillation, treated with amiodarone. No documented recurrence. Not on anticoagulation. She also has bilateral carotid artery disease with dopplers in 2015 showing 40-59% right ICA disease and 60-79% left ICA disease. Other medical history includes HLD, HTN, GERD and hypothyroidism.  Pt was recently seen in clinic 08/22/2017 for preoperative evaluation prior to undergoing total hip replacement. She denied CP. [facility] activity status calculated pt able to complete 6.27 METs w/o exertional symptoms. RCRI calculated at 0.9% for major cardiac events. Pt was cleared w/o need for further testing. Her BP however was moderately elevated, thus amlodipine 5 mg was added to regimen and pt encouraged to reduce daily salt intake. Pt was also ordered to get repeat carotid dopplers as last study was in 2015. Pt instructed to f/u in 1 year w/ Dr. Meda Coffee.   Pt had THR 10/22 w/ Dr. Alvan Dame. She had blood loss anemia from surgery, with drop in hgb from 12 to 7.3, but otherwise uneventful recovery. She was discharged home on 09/15/17.   Pt presented to the ED overnight with complaint of chest  pain. Felt similar to her angina she had in 2015 prior to CABG. Left sided chest pressure radiating up to her neck. Also with associated dyspnea, diaphoresis, n/v. Worse with exertion. She called EMS. Was given SL NTG but no improvement. Transported to the ED.  BP on arrival was markedly elevated at 200/75. Pt was given hydralazine and BP gradually improved, down in to the 242A>>834H systolic. CTA was negative for PE and dissection. Initial was 0.07. 2nd and 3rd troponin increased to 0.24 and 0.26 respectively. SCr also increased from 1.09 to 1.80. BUN increased from 15>>22.  It was noted in H&P that pt had recently ran out of her clonidine the day prior to presentation. EKG showed sinus tach 100 bpm w/ slight ST depressions in V4-V5.   She denies any recurrent CP overnight. Currently asymptomatic. BP meds include clonidine, hydralazine, and metoprolol. BP is improved at 121/71. Currently not on ASA. Statin not ordered.    Past Medical History:  Diagnosis Date  . Carotid bruit   . Chronic lower back pain    [facility]  . Coronary artery disease    CABG 06/2014.  Normal LV function.  . DDD (degenerative disc disease), lumbar    [facility]  . Dysrhythmia   . GERD (gastroesophageal reflux disease)   . Heart murmur   . History of aortic valve replacement 06/2014   (pericardial tissue valve) for AS  . Hx of echocardiogram 07/2014   Echo (9/15):  EF 60-65%, no RWMA, mild MR, mild LAE, AVR ok (mean 15 mmHg)  .  Hyperlipidemia   . Hypertension   . Hypothyroidism 06/2014   started on levothyroxine while in hospital for her CABG  . Myocardial infarction ([place])    mini  . Sinus bradycardia   . Tobacco dependence in remission     Past Surgical History:  Procedure Laterality Date  . ABDOMINAL HYSTERECTOMY  1970's  . AORTIC VALVE REPLACEMENT [name] 06/28/2014   Procedure: Aortic valve replacement using [facility] Ease 55mm valve Serial number [id number];  Surgeon: Grace Isaac, MD;  Location: [place];   Service: Open Heart Surgery;  Laterality: [name];  . CARDIAC CATHETERIZATION  06/20/2014  . CATARACT EXTRACTION W/ INTRAOCULAR LENS  IMPLANT, BILATERAL Bilateral ~ 02/2014  . CONVERSION TO TOTAL HIP Left 09/12/2017   Procedure: Conversion of previous left hip surgery to left total hip arthroplasty-anterior approach;  Surgeon: Paralee Cancel, MD;  Location: [facility];  Service: Orthopedics;  Laterality: Left;  . CORONARY ARTERY BYPASS GRAFT [name] 06/28/2014   Procedure: Coronary artery bypass grafting on pump using left internal mammary artery to left anterior descending artery, right greater saphenous vein to right coronary artery.;  Surgeon: Grace Isaac, MD;  Location: [place];  Service: Open Heart Surgery;  Laterality: [name];  . HEEL SPUR SURGERY Right   . HIP ORIF W/ CAPSULOTOMY Left 1980's  . INTRAOPERATIVE TRANSESOPHAGEAL ECHOCARDIOGRAM [name] 06/28/2014   Procedure: INTRAOPERATIVE TRANSESOPHAGEAL ECHOCARDIOGRAM;  Surgeon: Grace Isaac, MD;  Location: [place];  Service: Open Heart Surgery;  Laterality: [name];  . LEFT HEART CATHETERIZATION WITH CORONARY ANGIOGRAM [name] 06/20/2014   Procedure: LEFT HEART CATHETERIZATION WITH CORONARY ANGIOGRAM;  Surgeon: Peter M Martinique, MD;  Location: [facility] C[facility];  Service: Cardiovascular;  Laterality: [name];  . MULTIPLE EXTRACTIONS WITH ALVEOLOPLASTY [name] 06/25/2014   Procedure: Extraction of tooth #'s 2,3,4,14, 18, 19 with alveoloplasty and gross debridement of remaining teeth;  Surgeon: Lenn Cal, DDS;  Location: [place];  Service: Oral Surgery;  Laterality: [name];  . TONSILLECTOMY  1978     Home Medications:  Prior to Admission medications   Medication Sig Start Date End Date Taking? Authorizing Provider  cloNIDine (CATAPRES) 0.2 MG tablet Take 0.2 mg by mouth 3 (three) times daily. 06/22/17  Yes [provider]  hydrALAZINE (APRESOLINE) 50 MG tablet Take 50 mg by mouth 2 (two) times daily. 06/22/17  Yes [provider]  levothyroxine (SYNTHROID, LEVOTHROID)  50 MCG tablet Take 1 tablet (50 mcg total) by mouth daily before breakfast. 07/06/14  Yes Barrett, Erin R, PA-C  methocarbamol (ROBAXIN) 500 MG tablet Take 1 tablet (500 mg total) by mouth every 6 (six) hours as needed for muscle spasms. 09/14/17  Yes Babish, Rodman Key, PA-C  metoprolol (TOPROL-XL) 200 MG 24 hr tablet Take 200 mg by mouth daily. 06/22/17  Yes [provider]  oxyCODONE 10 MG TABS Take 1-2 tablets (10-20 mg total) by mouth every 4 (four) hours as needed for moderate pain. 09/14/17  Yes Babish, Rodman Key, PA-C  atorvastatin (LIPITOR) 40 MG tablet Take 1 tablet (40 mg total) by mouth daily. Patient not taking: Reported on 10/23/2017 01/17/15   Dorothy Spark, MD    Inpatient Medications: Scheduled Meds: . cloNIDine  0.2 mg Oral TID  . hydrALAZINE  50 mg Oral BID  . levothyroxine  50 mcg Oral QAC breakfast  . metoprolol  200 mg Oral Daily   Continuous Infusions:  PRN Meds: acetaminophen **OR** acetaminophen, methocarbamol, ondansetron **OR** ondansetron (ZOFRAN) IV, oxyCODONE  Allergies:    Allergies  Allergen Reactions  .  Morphine Nausea And Vomiting    ONLY IN IV FORM    Social History:   Social History   Socioeconomic History  . Marital status: Married    Spouse name: Not on file  . Number of children: Not on file  . Years of education: Not on file  . Highest education level: Not on file  Social Needs  . Financial resource strain: Not on file  . Food insecurity - worry: Not on file  . Food insecurity - inability: Not on file  . Transportation needs - medical: Not on file  . Transportation needs - non-medical: Not on file  Occupational History    Employer: [employer]    Comment: Bakery department  Tobacco Use  . Smoking status: Former Smoker    Packs/day: 0.75    Years: 50.00    Pack years: 37.50    Types: Cigarettes    Last attempt to quit: 06/22/2014    Years since quitting: 3.3  . Smokeless tobacco: Never Used  . Tobacco comment: 06/20/2014  "I've been using the e-cigarettes to try to get me off the regular cigarettes"  Substance and Sexual Activity  . Alcohol use: No  . Drug use: No  . Sexual activity: Not Currently  Other Topics Concern  . Not on file  Social History Narrative   Marital status: married x 13 years      Children: 2 children; 3 grandchildren.      Lives: with husband, 2 dogs.      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  Quit 06/2014.      Alcohol:  None      Drugs; None      Exercise: none    Family History:    Family History  Problem Relation Age of Onset  . Hypertension Mother   . Heart disease Mother        CAD; needs pacemaker75  . Stroke Mother   . Hypertension Father   . Heart disease Father 70       pacemaker  . Hyperlipidemia Brother   . Heart attack Neg Hx      ROS:  Please see the history of present illness.  ROS  All other ROS reviewed and negative.     Physical Exam/Data:   Vitals:   10/24/17 2100 10/24/17 2113 10/25/17 0457 10/25/17 0800  BP:  123/71 101/79 121/71  Pulse:   67 86  Resp:   19 20  Temp:  98.2 F (36.8 C) 98 F (36.7 C) 97.9 F (36.6 C)  TempSrc:  Oral Oral Oral  SpO2:   100% 97%  Weight:   176 lb 6.4 oz (80 kg)   Height: 5\' 6"  (1.676 m)       Intake/Output Summary (Last 24 hours) at 10/25/2017 0837 Last data filed at 10/25/2017 0650 Gross per 24 hour  Intake 598.04 ml  Output 450 ml  Net 148.04 ml   Filed Weights   10/24/17 1400 10/25/17 0457  Weight: 180 lb 16 oz (82.1 kg) 176 lb 6.4 oz (80 kg)   Body mass index is 28.47 kg/m.  General:  Well nourished, well developed, in no acute distress HEENT: normal Lymph: no adenopathy Neck: no JVD Endocrine:  No thryomegaly Vascular: No carotid bruits; FA pulses 2+ bilaterally without bruits  Cardiac:  normal S1, S2; RRR; 3/6 SM Lungs:  clear to auscultation bilaterally, no wheezing, rhonchi or rales  Abd: soft,  nontender, no hepatomegaly  Ext: no  edema Musculoskeletal:  No deformities, BUE and BLE strength normal and equal Skin: warm and dry  Neuro:  CNs 2-12 intact, no focal abnormalities noted Psych:  Normal affect   EKG:  The EKG was personally reviewed and demonstrates:  NSR slight new ST depressions in V4 and V5 Telemetry:  Telemetry was personally reviewed and demonstrates:  NSR  Relevant CV Studies: No recent studies   CABG in 2015   Laboratory Data:  Chemistry Recent Labs  Lab 10/23/17 2238 10/25/17 0404  NA 139 137  K 3.5 5.6*  CL 107 102  CO2 21* 25  GLUCOSE 147* 114*  BUN 15 22*  CREATININE 1.09* 1.80*  CALCIUM 8.8* 9.0  GFRNONAA 51* 28*  GFRAA 59* 32*  ANIONGAP 11 10    No results for input(s): PROT, ALBUMIN, AST, ALT, ALKPHOS, BILITOT in the last 168 hours. Hematology Recent Labs  Lab 10/23/17 2238 10/25/17 0404  WBC 13.5* 10.7*  RBC 3.38* 3.25*  HGB 8.8* 8.3*  HCT 28.5* 28.1*  MCV 84.3 86.5  MCH 26.0 25.5*  MCHC 30.9 29.5*  RDW 15.9* 16.6*  PLT 204 245   Cardiac Enzymes Recent Labs  Lab 10/24/17 0132 10/24/17 1123 10/24/17 1409  TROPONINI 0.07* 0.24* 0.26*    Recent Labs  Lab 10/23/17 2251  TROPIPOC 0.01    BNPNo results for input(s): BNP, PROBNP in the last 168 hours.  DDimer No results for input(s): DDIMER in the last 168 hours.  Radiology/Studies:  Dg Chest 2 View  Result Date: 10/23/2017 CLINICAL DATA:  Acute chest pain. EXAM: CHEST  2 VIEW COMPARISON:  08/01/2014 and prior radiographs FINDINGS: Cardiomegaly and CABG/ aortic valve replacement again noted. There is no evidence of focal airspace disease, pulmonary edema, suspicious pulmonary nodule/mass, pleural effusion, or pneumothorax. No acute bony abnormalities are identified. IMPRESSION: Cardiomegaly without evidence of acute cardiopulmonary disease. Electronically Signed   By: Margarette Canada M.D.   On: 10/23/2017 23:44   Ct Angio Chest Pe W And/or Wo Contrast  Result Date: 10/24/2017 CLINICAL DATA:  66 year old female  with chest pain. EXAM: CT ANGIOGRAPHY CHEST WITH CONTRAST TECHNIQUE: Multidetector CT imaging of the chest was performed using the standard protocol during bolus administration of intravenous contrast. Multiplanar CT image reconstructions and MIPs were obtained to evaluate the vascular anatomy. CONTRAST:  150mL ISOVUE-370 IOPAMIDOL (ISOVUE-370) INJECTION 76% COMPARISON:  Chest radiograph dated 10/23/2017 FINDINGS: Cardiovascular: There is mild cardiomegaly. No pericardial effusion. Multi vessel coronary vascular calcification with involvement of the LAD, RCA, and left circumflex artery. Mechanical aortic valve and CABG clips. There is moderate atherosclerotic calcification of the thoracic aorta. The origins of the great vessels of the aortic arch appear patent. There is no CT evidence of pulmonary embolism. Mediastinum/Nodes: No hilar or mediastinal adenopathy. There is a small hiatal hernia. The esophagus is grossly unremarkable. Lungs/Pleura: Patchy ground-glass density throughout the lungs most consistent with atelectatic changes. Mild interstitial edema is not excluded. Clinical correlation is recommended. Trace right pleural effusion noted. No focal consolidation or pneumothorax. The central airways are patent. Upper Abdomen: Slight irregularity of the hepatic contour concerning for cirrhosis. Clinical correlation is recommended. Mildly enlarged portacaval lymph node. Musculoskeletal: Degenerative changes of the spine. Median sternotomy wires. No acute osseous pathology. Review of the MIP images confirms the above findings. IMPRESSION: 1. No CT evidence of pulmonary embolism. 2. Mild cardiomegaly, coronary vascular calcification, and mechanical aortic valve. 3. Trace right pleural effusion.  No focal consolidation. 4.  Aortic  Atherosclerosis (ICD10-I70.0). Electronically Signed   By: Anner Crete M.D.   On: 10/24/2017 00:27    Assessment and Plan:   1. NSTEMI : s/p CABG x 2 in 2015. Recent CP similar  to previous angina, but also in the setting of hypertensive emergency with SBP in the 200s. Troponin trend 0.07>>0.24>>0.26. EKG NSR new slight ST depressions in V4 and V5. Symptoms improved with BP control. Symptoms and troponin leak may be secondary to elevated BP, however given her history and spike in enzyme elevation, will treat as a NSTEMI. We will restart IV heparin and hydrate w/ IVFs given slight increase in SCr. We will check a f/u BMP later this afternoon ~1pm and if renal function improved, will do cath later today. Will also check f/u CBC given anemia. Her hip surgery was on the left. Can do right radial or right femoral. Will restart statin.   2. HTN: rebound HTN in the setting of missed clonidine. BP improved after medical management. Continue to monitor.   3. CAD: s/p CABG x 2 in 2015. Plan for repeat LHC +/- PCI today as outlined above. Continue BB. Add statin and ASA.   4. Aortic Valve Disease: s/p tissues AVR in 2015. Murmur present on exam. Repeat echo.   5. Bilateral Carotid Artery Disease: overdue for f/u dopplers. Last checked in 2015.  40-59% right ICA disease and 60-79% left ICA disease. Outpatient order placed.  6. Renal Insuffiencey: will hydrate w/ IVFs prior to cath. Repeat BMP @1300 .   7. S/p Left THR: 09/12/17  8. Anemia: Hgb 8.3, from recent hip surgery. Trending upward. Was 7.3 post surgery. Monitor closely.     For questions or updates, please contact [place] Please consult [email or web address] for contact info under Cardiology/STEMI.   Signed, Lyda Jester, PA-C  10/25/2017 8:37 AM   Personally seen and examined. Agree with above.  66 year old with non-ST elevation myocardial infarction, classic anginal symptoms that reminded her of the way she felt prior to her bypass surgery, rise in troponin, also hypertension at the time surrounding the anxiety of her chest discomfort.  Labs reveal an increased creatinine, will be repeating in the afternoon,  hydrating.  Troponin is increased 0.26.  EKG personally reviewed shows ST segment depression in the lateral leads which are new from previous.  -Given the combination of these findings, I do believe that it is prudent to proceed with heart catheterization.  Perhaps one or more of her bypass grafts are not functioning.  As a stated above, we will hydrate and repeat her creatinine to make sure it is safe for her to undergo angiography.  Thankfully, she is feeling better currently.  See above for further details.  Aggressive medical therapy.  -Recent left hip replacement.  Still undergoing physical therapy.  She is progressing.  Candee Furbish, MD

## 2017-10-25 NOTE — Progress Notes (Addendum)
PROGRESS NOTE    Carrie [name]   YTK:[id number]  DOB: [birth year]  DOA: 10/23/2017 PCP: Antonietta Jewel, MD   Brief Narrative:   Carrie [name] is a 66 y.o. female with medical history significant of CAD and AS s/p AVR with bovine valve and 3 vessel bypass back in 2015, HTN, history of A.Fib in past doesn't appear to be on thinners.  Patient presents to the ED with c/o chest pain.  Located in central chest with radiation to back.  Like prior cardiac chest pain before surgery in 2015.  Symptoms onset at 8pm, progressively worsening, associated N/V x2.  Nothing makes better or worse.  Subjective: Chest pain has resolved but it felt like pain she had prior to her CABG associated with a cold sweat. It improved after she got to the ER and received treatment.  ROS: no complaints of nausea, vomiting, constipation diarrhea, cough, dyspnea or dysuria. No other complaints.   Assessment & Plan:   Principal Problem:   Hypertensive urgency - BP improved on Catapress, Hydralazine and Toprolol- wean off of Nitro infusion  Active Problems:   CAD (coronary artery disease) s/p CABG - Chest pain- mild elevation in troponin - chest pain on admission- per Cards, she will need a cath- she is receiving IV hydration overnight for this - cont ASA, Lipitor and Heparin infusion  Hyperkalemia - cause uncertain, not on ACE/ ARB- no K replacement given - will give Kayexalate and repeat  AKI on CKD 3  -Cr 1.0 on 12/2 and 1.8 now follow in AM after Hydration  hypothyroid - synthroid    DVT prophylaxis: Heparin infusion Code Status: Full code Family Communication:  Disposition Plan: cath tomorrow Consultants:   cardiology Procedures:    Antimicrobials:  Anti-infectives (From admission, onward)   None       Objective: Vitals:   10/25/17 1303 10/25/17 1305 10/25/17 1639 10/25/17 1712  BP: (!) 96/54 111/61 139/67 (!) 154/62  Pulse: 79  76   Resp: 17  17   Temp: 98 F (36.7 C)  98.5 F  (36.9 C)   TempSrc: Oral  Oral   SpO2: 94%  94%   Weight:      Height:        Intake/Output Summary (Last 24 hours) at 10/25/2017 1759 Last data filed at 10/25/2017 1755 Gross per 24 hour  Intake 718.04 ml  Output 1200 ml  Net -481.96 ml   Filed Weights   10/24/17 1400 10/25/17 0457  Weight: 82.1 kg (180 lb 16 oz) 80 kg (176 lb 6.4 oz)    Examination: General exam: Appears comfortable  HEENT: PERRLA, oral mucosa moist, no sclera icterus or thrush Respiratory system: Clear to auscultation. Respiratory effort normal. Cardiovascular system: S1 & S2 heard, RRR.  No murmurs  Gastrointestinal system: Abdomen soft, non-tender, nondistended. Normal bowel sound. No organomegaly Central nervous system: Alert and oriented. No focal neurological deficits. Extremities: No cyanosis, clubbing or edema Skin: No rashes or ulcers Psychiatry:  Mood & affect appropriate.     Data Reviewed: I have personally reviewed following labs and imaging studies  CBC: Recent Labs  Lab 10/23/17 2238 10/25/17 0404 10/25/17 1234  WBC 13.5* 10.7* 9.5  HGB 8.8* 8.3* 8.0*  HCT 28.5* 28.1* 27.0*  MCV 84.3 86.5 87.1  PLT 204 245 557   Basic Metabolic Panel: Recent Labs  Lab 10/23/17 2238 10/25/17 0404 10/25/17 0838 10/25/17 1234  NA 139 137 137 137  K 3.5 5.6* 5.2* 6.0*  CL 107  102 104 105  CO2 21* 25 21* 25  GLUCOSE 147* 114* 118* 100*  BUN 15 22* 23* 24*  CREATININE 1.09* 1.80* 1.87* 1.88*  CALCIUM 8.8* 9.0 9.1 8.9   GFR: Estimated Creatinine Clearance: 30.6 mL/min (A) (by C-G formula based on SCr of 1.88 mg/dL (H)). Liver Function Tests: No results for input(s): AST, ALT, ALKPHOS, BILITOT, PROT, ALBUMIN in the last 168 hours. No results for input(s): LIPASE, AMYLASE in the last 168 hours. No results for input(s): AMMONIA in the last 168 hours. Coagulation Profile: No results for input(s): INR, PROTIME in the last 168 hours. Cardiac Enzymes: Recent Labs  Lab 10/24/17 0132  10/24/17 1123 10/24/17 1409  TROPONINI 0.07* 0.24* 0.26*   BNP (last 3 results) No results for input(s): PROBNP in the last 8760 hours. HbA1C: No results for input(s): HGBA1C in the last 72 hours. CBG: No results for input(s): GLUCAP in the last 168 hours. Lipid Profile: No results for input(s): CHOL, HDL, LDLCALC, TRIG, CHOLHDL, LDLDIRECT in the last 72 hours. Thyroid Function Tests: No results for input(s): TSH, T4TOTAL, FREET4, T3FREE, THYROIDAB in the last 72 hours. Anemia Panel: No results for input(s): VITAMINB12, FOLATE, FERRITIN, TIBC, IRON, RETICCTPCT in the last 72 hours. Urine analysis:    Component Value Date/Time   COLORURINE YELLOW 07/03/2014 1228   APPEARANCEUR CLEAR 07/03/2014 1228   LABSPEC 1.014 07/03/2014 1228   PHURINE 6.0 07/03/2014 1228   GLUCOSEU NEGATIVE 07/03/2014 1228   HGBUR NEGATIVE 07/03/2014 1228   HGBUR negative 08/27/2009 0955   BILIRUBINUR NEGATIVE 07/03/2014 1228   BILIRUBINUR neg 05/24/2014 1428   KETONESUR NEGATIVE 07/03/2014 1228   PROTEINUR NEGATIVE 07/03/2014 1228   UROBILINOGEN 1.0 07/03/2014 1228   NITRITE NEGATIVE 07/03/2014 1228   LEUKOCYTESUR NEGATIVE 07/03/2014 1228   Sepsis Labs: @LABRCNTIP (procalcitonin:4,lacticidven:4) ) Recent Results (from the past 240 hour(s))  MRSA PCR Screening     Status: None   Collection Time: 10/24/17  9:19 PM  Result Value Ref Range Status   MRSA by PCR NEGATIVE NEGATIVE Final    Comment:        The GeneXpert MRSA Assay (FDA approved for NASAL specimens only), is one component of a comprehensive MRSA colonization surveillance program. It is not intended to diagnose MRSA infection nor to guide or monitor treatment for MRSA infections.          Radiology Studies: Dg Chest 2 View  Result Date: 10/23/2017 CLINICAL DATA:  Acute chest pain. EXAM: CHEST  2 VIEW COMPARISON:  08/01/2014 and prior radiographs FINDINGS: Cardiomegaly and CABG/ aortic valve replacement again noted. There is no  evidence of focal airspace disease, pulmonary edema, suspicious pulmonary nodule/mass, pleural effusion, or pneumothorax. No acute bony abnormalities are identified. IMPRESSION: Cardiomegaly without evidence of acute cardiopulmonary disease. Electronically Signed   By: Margarette Canada M.D.   On: 10/23/2017 23:44   Ct Angio Chest Pe W And/or Wo Contrast  Result Date: 10/24/2017 CLINICAL DATA:  66 year old female with chest pain. EXAM: CT ANGIOGRAPHY CHEST WITH CONTRAST TECHNIQUE: Multidetector CT imaging of the chest was performed using the standard protocol during bolus administration of intravenous contrast. Multiplanar CT image reconstructions and MIPs were obtained to evaluate the vascular anatomy. CONTRAST:  1110mL ISOVUE-370 IOPAMIDOL (ISOVUE-370) INJECTION 76% COMPARISON:  Chest radiograph dated 10/23/2017 FINDINGS: Cardiovascular: There is mild cardiomegaly. No pericardial effusion. Multi vessel coronary vascular calcification with involvement of the LAD, RCA, and left circumflex artery. Mechanical aortic valve and CABG clips. There is moderate atherosclerotic calcification of the thoracic aorta.  The origins of the great vessels of the aortic arch appear patent. There is no CT evidence of pulmonary embolism. Mediastinum/Nodes: No hilar or mediastinal adenopathy. There is a small hiatal hernia. The esophagus is grossly unremarkable. Lungs/Pleura: Patchy ground-glass density throughout the lungs most consistent with atelectatic changes. Mild interstitial edema is not excluded. Clinical correlation is recommended. Trace right pleural effusion noted. No focal consolidation or pneumothorax. The central airways are patent. Upper Abdomen: Slight irregularity of the hepatic contour concerning for cirrhosis. Clinical correlation is recommended. Mildly enlarged portacaval lymph node. Musculoskeletal: Degenerative changes of the spine. Median sternotomy wires. No acute osseous pathology. Review of the MIP images confirms  the above findings. IMPRESSION: 1. No CT evidence of pulmonary embolism. 2. Mild cardiomegaly, coronary vascular calcification, and mechanical aortic valve. 3. Trace right pleural effusion.  No focal consolidation. 4.  Aortic Atherosclerosis (ICD10-I70.0). Electronically Signed   By: Anner Crete M.D.   On: 10/24/2017 00:27      Scheduled Meds: . atorvastatin  40 mg Oral Daily  . cloNIDine  0.2 mg Oral TID  . hydrALAZINE  50 mg Oral BID  . levothyroxine  50 mcg Oral QAC breakfast  . metoprolol  200 mg Oral Daily  . sodium chloride flush  3 mL Intravenous Q12H   Continuous Infusions: . sodium chloride    . sodium chloride 100 mL/hr at 10/25/17 1043  . heparin 1,400 Units/hr (10/25/17 1711)     LOS: 1 day    Time spent in minutes: 35    Debbe Odea, MD [facility] Pager: [email or web address] Password [email or web address] 10/25/2017, 5:59 PM

## 2017-10-25 NOTE — Progress Notes (Signed)
ANTICOAGULATION CONSULT NOTE - Follow Up Consult  Pharmacy Consult for Restart Heparin Indication: chest pain/ACS  Allergies  Allergen Reactions  . Morphine Nausea And Vomiting    ONLY IN IV FORM    Patient Measurements: Height: 5\' 6"  (167.6 cm) Weight: 176 lb 6.4 oz (80 kg) IBW/kg (Calculated) : 59.3 Heparin Dosing Weight: 76.5 kg  Vital Signs: Temp: 97.9 F (36.6 C) (12/04 0800) Temp Source: Oral (12/04 0800) BP: 121/71 (12/04 0800) Pulse Rate: 86 (12/04 0800)  Labs: Recent Labs    10/23/17 2238 10/24/17 0132 10/24/17 1123 10/24/17 1409 10/24/17 2159 10/25/17 0404 10/25/17 0720 10/25/17 0838  HGB 8.8*  --   --   --   --  8.3*  --   --   HCT 28.5*  --   --   --   --  28.1*  --   --   PLT 204  --   --   --   --  245  --   --   HEPARINUNFRC  --   --   --   --  0.18*  --  0.44  --   CREATININE 1.09*  --   --   --   --  1.80*  --  1.87*  TROPONINI  --  0.07* 0.24* 0.26*  --   --   --   --     Estimated Creatinine Clearance: 30.7 mL/min (A) (by C-G formula based on SCr of 1.87 mg/dL (H)).   Medications:  *Patient was therapeutic on IV Heparin at 1200 units/hr prior to being discontinued this AM.   Assessment: 66 year old female admitted with chest pain and initially started on IV heparin. This was discontinued this AM by the Hospitalist but now being resumed by Cardiology for NSTEMI.   Heparin level this am prior to discontinuing was therapeutic at 0.44 on 1200 units/hr.  Heparin has been off for 2 hours.  H/H low-stable. Platelets are within normal limits. No bleeding noted.  SCr is elevated at 1.87 with estimated CrCl ~30 mL/min.   Goal of Therapy:  Heparin level 0.3-0.7 units/ml Monitor platelets by anticoagulation protocol: Yes   Plan:  Restart Heparin at 1200 units/hr.  Recheck Heparin level in 6 hours. Daily Heparin level and CBC while on therapy.   Sloan Leiter, PharmD, BCPS, BCCCP Clinical Pharmacist Clinical phone 10/25/2017 until 3:30PM [phone] After hours, please call #[phone] 10/25/2017,10:07 AM

## 2017-10-25 NOTE — Progress Notes (Signed)
ANTICOAGULATION CONSULT NOTE - Follow Up Consult  Pharmacy Consult for heparin Indication: chest pain/ACS  Labs: Recent Labs    10/23/17 2238 10/24/17 0132 10/24/17 1123 10/24/17 1409 10/24/17 2159  HGB 8.8*  --   --   --   --   HCT 28.5*  --   --   --   --   PLT 204  --   --   --   --   HEPARINUNFRC  --   --   --   --  0.18*  CREATININE 1.09*  --   --   --   --   TROPONINI  --  0.07* 0.24* 0.26*  --     Assessment: 66yo female subtherapeutic on heparin with initial dosing for CP.  Goal of Therapy:  Heparin level 0.3-0.7 units/ml Monitor platelets by anticoagulation protocol: Yes   Plan:  Will rebolus with heparin 2000 units and increase gtt by 3 units/kg/hr to 1200 units/hr and check level in [place], PharmD, BCPS  10/25/2017,12:19 AM

## 2017-10-25 NOTE — Progress Notes (Addendum)
ANTICOAGULATION CONSULT NOTE - Follow Up Consult  Pharmacy Consult for Heparin Indication: chest pain/ACS  Allergies  Allergen Reactions  . Morphine Nausea And Vomiting    ONLY IN IV FORM    Patient Measurements: Height: 5\' 6"  (167.6 cm) Weight: 176 lb 6.4 oz (80 kg) IBW/kg (Calculated) : 59.3 Heparin Dosing Weight: 76.5 kg  Vital Signs: Temp: 98 F (36.7 C) (12/04 1303) Temp Source: Oral (12/04 1303) BP: 111/61 (12/04 1305) Pulse Rate: 79 (12/04 1303)  Labs: Recent Labs    10/23/17 2238 10/24/17 0132 10/24/17 1123 10/24/17 1409 10/24/17 2159 10/25/17 0404 10/25/17 0720 10/25/17 0838 10/25/17 1234 10/25/17 1548  HGB 8.8*  --   --   --   --  8.3*  --   --  8.0*  --   HCT 28.5*  --   --   --   --  28.1*  --   --  27.0*  --   PLT 204  --   --   --   --  245  --   --  233  --   HEPARINUNFRC  --   --   --   --  0.18*  --  0.44  --   --  0.20*  CREATININE 1.09*  --   --   --   --  1.80*  --  1.87* 1.88*  --   TROPONINI  --  0.07* 0.24* 0.26*  --   --   --   --   --   --     Estimated Creatinine Clearance: 30.6 mL/min (A) (by C-G formula based on SCr of 1.88 mg/dL (H)).   Medications:  . sodium chloride    . sodium chloride 100 mL/hr at 10/25/17 1043  . heparin 1,200 Units/hr (10/25/17 1248)   Assessment: 66 year old female admitted with chest pain and initially started on IV heparin. This was discontinued this AM by the Hospitalist but resumed by Cardiology for NSTEMI.   Heparin level is subtherapeutic at 0.2 on 1200 units/hr. No bleeding noted. No problems with line or bleeding per RN.  Goal of Therapy:  Heparin level 0.3-0.7 units/ml Monitor platelets by anticoagulation protocol: Yes   Plan:  Heparin 1100 units IV bolus then increase rate to 1400 units/hr 8 hr heparin level Daily heparin level and CBC Monitor for s/sx of bleeding   Renold Genta, PharmD, [place] - [phone] 10/25/2017 4:41 PM

## 2017-10-25 NOTE — H&P (View-Only) (Signed)
Cardiology Consultation:   Patient ID: Carrie [name]; [id number]; [birth year]   Admit date: 10/23/2017 Date of Consult: 10/25/2017  Primary Care Provider: Antonietta Jewel, MD Primary Cardiologist: Dr. Meda Coffee Primary Electrophysiologist:  Dr. Rayann Heman (seen in 2010 for palpitations)   Patient Profile:   Carrie [name] is a 66 y.o. female with a hx of CAD and aortic stenosis s/p CABG + AVR in 2015, post operative afib at time of CABG, HTN, HLD and bilateral carotid artery stenosis, admitted by IM for chest pain, in the setting of hypertensive urgency.   History of Present Illness:   Pt is followed by Dr. Meda Coffee. In 2015, she underwent CABG x 2 for CAD s/p LIMA-LAD and SVG-RCA. EF was normal at 60-65%. At time of CABG, she also underwent AVR w/ bioprosthetic tissue valve for severe AS. Post operative course was complicated by atrial fibrillation, treated with amiodarone. No documented recurrence. Not on anticoagulation. She also has bilateral carotid artery disease with dopplers in 2015 showing 40-59% right ICA disease and 60-79% left ICA disease. Other medical history includes HLD, HTN, GERD and hypothyroidism.  Pt was recently seen in clinic 08/22/2017 for preoperative evaluation prior to undergoing total hip replacement. She denied CP. [facility] activity status calculated pt able to complete 6.27 METs w/o exertional symptoms. RCRI calculated at 0.9% for major cardiac events. Pt was cleared w/o need for further testing. Her BP however was moderately elevated, thus amlodipine 5 mg was added to regimen and pt encouraged to reduce daily salt intake. Pt was also ordered to get repeat carotid dopplers as last study was in 2015. Pt instructed to f/u in 1 year w/ Dr. Meda Coffee.   Pt had THR 10/22 w/ Dr. Alvan Dame. She had blood loss anemia from surgery, with drop in hgb from 12 to 7.3, but otherwise uneventful recovery. She was discharged home on 09/15/17.   Pt presented to the ED overnight with complaint of chest  pain. Felt similar to her angina she had in 2015 prior to CABG. Left sided chest pressure radiating up to her neck. Also with associated dyspnea, diaphoresis, n/v. Worse with exertion. She called EMS. Was given SL NTG but no improvement. Transported to the ED.  BP on arrival was markedly elevated at 200/75. Pt was given hydralazine and BP gradually improved, down in to the 403K>>742V systolic. CTA was negative for PE and dissection. Initial was 0.07. 2nd and 3rd troponin increased to 0.24 and 0.26 respectively. SCr also increased from 1.09 to 1.80. BUN increased from 15>>22.  It was noted in H&P that pt had recently ran out of her clonidine the day prior to presentation. EKG showed sinus tach 100 bpm w/ slight ST depressions in V4-V5.   She denies any recurrent CP overnight. Currently asymptomatic. BP meds include clonidine, hydralazine, and metoprolol. BP is improved at 121/71. Currently not on ASA. Statin not ordered.    Past Medical History:  Diagnosis Date  . Carotid bruit   . Chronic lower back pain    [facility]  . Coronary artery disease    CABG 06/2014.  Normal LV function.  . DDD (degenerative disc disease), lumbar    [facility]  . Dysrhythmia   . GERD (gastroesophageal reflux disease)   . Heart murmur   . History of aortic valve replacement 06/2014   (pericardial tissue valve) for AS  . Hx of echocardiogram 07/2014   Echo (9/15):  EF 60-65%, no RWMA, mild MR, mild LAE, AVR ok (mean 15 mmHg)  .  Hyperlipidemia   . Hypertension   . Hypothyroidism 06/2014   started on levothyroxine while in hospital for her CABG  . Myocardial infarction ([place])    mini  . Sinus bradycardia   . Tobacco dependence in remission     Past Surgical History:  Procedure Laterality Date  . ABDOMINAL HYSTERECTOMY  1970's  . AORTIC VALVE REPLACEMENT [name] 06/28/2014   Procedure: Aortic valve replacement using [facility] Ease 22mm valve Serial number [id number];  Surgeon: Grace Isaac, MD;  Location: [place];   Service: Open Heart Surgery;  Laterality: [name];  . CARDIAC CATHETERIZATION  06/20/2014  . CATARACT EXTRACTION W/ INTRAOCULAR LENS  IMPLANT, BILATERAL Bilateral ~ 02/2014  . CONVERSION TO TOTAL HIP Left 09/12/2017   Procedure: Conversion of previous left hip surgery to left total hip arthroplasty-anterior approach;  Surgeon: Paralee Cancel, MD;  Location: [facility];  Service: Orthopedics;  Laterality: Left;  . CORONARY ARTERY BYPASS GRAFT [name] 06/28/2014   Procedure: Coronary artery bypass grafting on pump using left internal mammary artery to left anterior descending artery, right greater saphenous vein to right coronary artery.;  Surgeon: Grace Isaac, MD;  Location: [place];  Service: Open Heart Surgery;  Laterality: [name];  . HEEL SPUR SURGERY Right   . HIP ORIF W/ CAPSULOTOMY Left 1980's  . INTRAOPERATIVE TRANSESOPHAGEAL ECHOCARDIOGRAM [name] 06/28/2014   Procedure: INTRAOPERATIVE TRANSESOPHAGEAL ECHOCARDIOGRAM;  Surgeon: Grace Isaac, MD;  Location: [place];  Service: Open Heart Surgery;  Laterality: [name];  . LEFT HEART CATHETERIZATION WITH CORONARY ANGIOGRAM [name] 06/20/2014   Procedure: LEFT HEART CATHETERIZATION WITH CORONARY ANGIOGRAM;  Surgeon: Peter M Martinique, MD;  Location: [facility];  Service: Cardiovascular;  Laterality: [name];  . MULTIPLE EXTRACTIONS WITH ALVEOLOPLASTY [name] 06/25/2014   Procedure: Extraction of tooth #'s 2,3,4,14, 18, 19 with alveoloplasty and gross debridement of remaining teeth;  Surgeon: Lenn Cal, DDS;  Location: [place];  Service: Oral Surgery;  Laterality: [name];  . TONSILLECTOMY  1978     Home Medications:  Prior to Admission medications   Medication Sig Start Date End Date Taking? Authorizing Provider  cloNIDine (CATAPRES) 0.2 MG tablet Take 0.2 mg by mouth 3 (three) times daily. 06/22/17  Yes [provider]  hydrALAZINE (APRESOLINE) 50 MG tablet Take 50 mg by mouth 2 (two) times daily. 06/22/17  Yes [provider]  levothyroxine (SYNTHROID, LEVOTHROID)  50 MCG tablet Take 1 tablet (50 mcg total) by mouth daily before breakfast. 07/06/14  Yes Barrett, Erin R, PA-C  methocarbamol (ROBAXIN) 500 MG tablet Take 1 tablet (500 mg total) by mouth every 6 (six) hours as needed for muscle spasms. 09/14/17  Yes Babish, Rodman Key, PA-C  metoprolol (TOPROL-XL) 200 MG 24 hr tablet Take 200 mg by mouth daily. 06/22/17  Yes [provider]  oxyCODONE 10 MG TABS Take 1-2 tablets (10-20 mg total) by mouth every 4 (four) hours as needed for moderate pain. 09/14/17  Yes Babish, Rodman Key, PA-C  atorvastatin (LIPITOR) 40 MG tablet Take 1 tablet (40 mg total) by mouth daily. Patient not taking: Reported on 10/23/2017 01/17/15   Dorothy Spark, MD    Inpatient Medications: Scheduled Meds: . cloNIDine  0.2 mg Oral TID  . hydrALAZINE  50 mg Oral BID  . levothyroxine  50 mcg Oral QAC breakfast  . metoprolol  200 mg Oral Daily   Continuous Infusions:  PRN Meds: acetaminophen **OR** acetaminophen, methocarbamol, ondansetron **OR** ondansetron (ZOFRAN) IV, oxyCODONE  Allergies:    Allergies  Allergen Reactions  .  Morphine Nausea And Vomiting    ONLY IN IV FORM    Social History:   Social History   Socioeconomic History  . Marital status: Married    Spouse name: Not on file  . Number of children: Not on file  . Years of education: Not on file  . Highest education level: Not on file  Social Needs  . Financial resource strain: Not on file  . Food insecurity - worry: Not on file  . Food insecurity - inability: Not on file  . Transportation needs - medical: Not on file  . Transportation needs - non-medical: Not on file  Occupational History    Employer: [employer]    Comment: Bakery department  Tobacco Use  . Smoking status: Former Smoker    Packs/day: 0.75    Years: 50.00    Pack years: 37.50    Types: Cigarettes    Last attempt to quit: 06/22/2014    Years since quitting: 3.3  . Smokeless tobacco: Never Used  . Tobacco comment: 06/20/2014  "I've been using the e-cigarettes to try to get me off the regular cigarettes"  Substance and Sexual Activity  . Alcohol use: No  . Drug use: No  . Sexual activity: Not Currently  Other Topics Concern  . Not on file  Social History Narrative   Marital status: married x 13 years      Children: 2 children; 3 grandchildren.      Lives: with husband, 2 dogs.      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  Quit 06/2014.      Alcohol:  None      Drugs; None      Exercise: none    Family History:    Family History  Problem Relation Age of Onset  . Hypertension Mother   . Heart disease Mother        CAD; needs pacemaker75  . Stroke Mother   . Hypertension Father   . Heart disease Father 53       pacemaker  . Hyperlipidemia Brother   . Heart attack Neg Hx      ROS:  Please see the history of present illness.  ROS  All other ROS reviewed and negative.     Physical Exam/Data:   Vitals:   10/24/17 2100 10/24/17 2113 10/25/17 0457 10/25/17 0800  BP:  123/71 101/79 121/71  Pulse:   67 86  Resp:   19 20  Temp:  98.2 F (36.8 C) 98 F (36.7 C) 97.9 F (36.6 C)  TempSrc:  Oral Oral Oral  SpO2:   100% 97%  Weight:   176 lb 6.4 oz (80 kg)   Height: 5\' 6"  (1.676 m)       Intake/Output Summary (Last 24 hours) at 10/25/2017 0837 Last data filed at 10/25/2017 0650 Gross per 24 hour  Intake 598.04 ml  Output 450 ml  Net 148.04 ml   Filed Weights   10/24/17 1400 10/25/17 0457  Weight: 180 lb 16 oz (82.1 kg) 176 lb 6.4 oz (80 kg)   Body mass index is 28.47 kg/m.  General:  Well nourished, well developed, in no acute distress HEENT: normal Lymph: no adenopathy Neck: no JVD Endocrine:  No thryomegaly Vascular: No carotid bruits; FA pulses 2+ bilaterally without bruits  Cardiac:  normal S1, S2; RRR; 3/6 SM Lungs:  clear to auscultation bilaterally, no wheezing, rhonchi or rales  Abd: soft,  nontender, no hepatomegaly  Ext: no  edema Musculoskeletal:  No deformities, BUE and BLE strength normal and equal Skin: warm and dry  Neuro:  CNs 2-12 intact, no focal abnormalities noted Psych:  Normal affect   EKG:  The EKG was personally reviewed and demonstrates:  NSR slight new ST depressions in V4 and V5 Telemetry:  Telemetry was personally reviewed and demonstrates:  NSR  Relevant CV Studies: No recent studies   CABG in 2015   Laboratory Data:  Chemistry Recent Labs  Lab 10/23/17 2238 10/25/17 0404  NA 139 137  K 3.5 5.6*  CL 107 102  CO2 21* 25  GLUCOSE 147* 114*  BUN 15 22*  CREATININE 1.09* 1.80*  CALCIUM 8.8* 9.0  GFRNONAA 51* 28*  GFRAA 59* 32*  ANIONGAP 11 10    No results for input(s): PROT, ALBUMIN, AST, ALT, ALKPHOS, BILITOT in the last 168 hours. Hematology Recent Labs  Lab 10/23/17 2238 10/25/17 0404  WBC 13.5* 10.7*  RBC 3.38* 3.25*  HGB 8.8* 8.3*  HCT 28.5* 28.1*  MCV 84.3 86.5  MCH 26.0 25.5*  MCHC 30.9 29.5*  RDW 15.9* 16.6*  PLT 204 245   Cardiac Enzymes Recent Labs  Lab 10/24/17 0132 10/24/17 1123 10/24/17 1409  TROPONINI 0.07* 0.24* 0.26*    Recent Labs  Lab 10/23/17 2251  TROPIPOC 0.01    BNPNo results for input(s): BNP, PROBNP in the last 168 hours.  DDimer No results for input(s): DDIMER in the last 168 hours.  Radiology/Studies:  Dg Chest 2 View  Result Date: 10/23/2017 CLINICAL DATA:  Acute chest pain. EXAM: CHEST  2 VIEW COMPARISON:  08/01/2014 and prior radiographs FINDINGS: Cardiomegaly and CABG/ aortic valve replacement again noted. There is no evidence of focal airspace disease, pulmonary edema, suspicious pulmonary nodule/mass, pleural effusion, or pneumothorax. No acute bony abnormalities are identified. IMPRESSION: Cardiomegaly without evidence of acute cardiopulmonary disease. Electronically Signed   By: Margarette Canada M.D.   On: 10/23/2017 23:44   Ct Angio Chest Pe W And/or Wo Contrast  Result Date: 10/24/2017 CLINICAL DATA:  66 year old female  with chest pain. EXAM: CT ANGIOGRAPHY CHEST WITH CONTRAST TECHNIQUE: Multidetector CT imaging of the chest was performed using the standard protocol during bolus administration of intravenous contrast. Multiplanar CT image reconstructions and MIPs were obtained to evaluate the vascular anatomy. CONTRAST:  114mL ISOVUE-370 IOPAMIDOL (ISOVUE-370) INJECTION 76% COMPARISON:  Chest radiograph dated 10/23/2017 FINDINGS: Cardiovascular: There is mild cardiomegaly. No pericardial effusion. Multi vessel coronary vascular calcification with involvement of the LAD, RCA, and left circumflex artery. Mechanical aortic valve and CABG clips. There is moderate atherosclerotic calcification of the thoracic aorta. The origins of the great vessels of the aortic arch appear patent. There is no CT evidence of pulmonary embolism. Mediastinum/Nodes: No hilar or mediastinal adenopathy. There is a small hiatal hernia. The esophagus is grossly unremarkable. Lungs/Pleura: Patchy ground-glass density throughout the lungs most consistent with atelectatic changes. Mild interstitial edema is not excluded. Clinical correlation is recommended. Trace right pleural effusion noted. No focal consolidation or pneumothorax. The central airways are patent. Upper Abdomen: Slight irregularity of the hepatic contour concerning for cirrhosis. Clinical correlation is recommended. Mildly enlarged portacaval lymph node. Musculoskeletal: Degenerative changes of the spine. Median sternotomy wires. No acute osseous pathology. Review of the MIP images confirms the above findings. IMPRESSION: 1. No CT evidence of pulmonary embolism. 2. Mild cardiomegaly, coronary vascular calcification, and mechanical aortic valve. 3. Trace right pleural effusion.  No focal consolidation. 4.  Aortic  Atherosclerosis (ICD10-I70.0). Electronically Signed   By: Anner Crete M.D.   On: 10/24/2017 00:27    Assessment and Plan:   1. NSTEMI : s/p CABG x 2 in 2015. Recent CP similar  to previous angina, but also in the setting of hypertensive emergency with SBP in the 200s. Troponin trend 0.07>>0.24>>0.26. EKG NSR new slight ST depressions in V4 and V5. Symptoms improved with BP control. Symptoms and troponin leak may be secondary to elevated BP, however given her history and spike in enzyme elevation, will treat as a NSTEMI. We will restart IV heparin and hydrate w/ IVFs given slight increase in SCr. We will check a f/u BMP later this afternoon ~1pm and if renal function improved, will do cath later today. Will also check f/u CBC given anemia. Her hip surgery was on the left. Can do right radial or right femoral. Will restart statin.   2. HTN: rebound HTN in the setting of missed clonidine. BP improved after medical management. Continue to monitor.   3. CAD: s/p CABG x 2 in 2015. Plan for repeat LHC +/- PCI today as outlined above. Continue BB. Add statin and ASA.   4. Aortic Valve Disease: s/p tissues AVR in 2015. Murmur present on exam. Repeat echo.   5. Bilateral Carotid Artery Disease: overdue for f/u dopplers. Last checked in 2015.  40-59% right ICA disease and 60-79% left ICA disease. Outpatient order placed.  6. Renal Insuffiencey: will hydrate w/ IVFs prior to cath. Repeat BMP @1300 .   7. S/p Left THR: 09/12/17  8. Anemia: Hgb 8.3, from recent hip surgery. Trending upward. Was 7.3 post surgery. Monitor closely.     For questions or updates, please contact [facility] Please consult [email or web address] for contact info under Cardiology/STEMI.   Signed, Lyda Jester, PA-C  10/25/2017 8:37 AM   Personally seen and examined. Agree with above.  66 year old with non-ST elevation myocardial infarction, classic anginal symptoms that reminded her of the way she felt prior to her bypass surgery, rise in troponin, also hypertension at the time surrounding the anxiety of her chest discomfort.  Labs reveal an increased creatinine, will be repeating in the afternoon,  hydrating.  Troponin is increased 0.26.  EKG personally reviewed shows ST segment depression in the lateral leads which are new from previous.  -Given the combination of these findings, I do believe that it is prudent to proceed with heart catheterization.  Perhaps one or more of her bypass grafts are not functioning.  As a stated above, we will hydrate and repeat her creatinine to make sure it is safe for her to undergo angiography.  Thankfully, she is feeling better currently.  See above for further details.  Aggressive medical therapy.  -Recent left hip replacement.  Still undergoing physical therapy.  She is progressing.  Candee Furbish, MD

## 2017-10-25 NOTE — Plan of Care (Signed)
Pt denies anxiety. Displays no signs or symptoms of distress 

## 2017-10-26 ENCOUNTER — Encounter (HOSPITAL_COMMUNITY)
Admission: EM | Disposition: A | Discharge status: Home / Self Care | Payer: Self-pay | Source: Home / Self Care | Attending: Internal Medicine

## 2017-10-26 DIAGNOSIS — I214 Non-ST elevation (NSTEMI) myocardial infarction: Secondary | ICD-10-CM

## 2017-10-26 DIAGNOSIS — I2510 Atherosclerotic heart disease of native coronary artery without angina pectoris: Secondary | ICD-10-CM

## 2017-10-26 DIAGNOSIS — I10 Essential (primary) hypertension: Secondary | ICD-10-CM

## 2017-10-26 DIAGNOSIS — I158 Other secondary hypertension: Secondary | ICD-10-CM

## 2017-10-26 DIAGNOSIS — R079 Chest pain, unspecified: Secondary | ICD-10-CM

## 2017-10-26 HISTORY — PX: CORONARY/GRAFT ANGIOGRAPHY: CATH118237

## 2017-10-26 LAB — POCT ACTIVATED CLOTTING TIME: Activated Clotting Time: 153 seconds

## 2017-10-26 LAB — HEPARIN LEVEL (UNFRACTIONATED)
Heparin Unfractionated: 0.4 IU/mL (ref 0.30–0.70)
Heparin Unfractionated: 0.52 IU/mL (ref 0.30–0.70)

## 2017-10-26 LAB — BASIC METABOLIC PANEL
Anion gap: 9 (ref 5–15)
BUN: 22 mg/dL — AB (ref 6–20)
CO2: 23 mmol/L (ref 22–32)
Calcium: 8.7 mg/dL — ABNORMAL LOW (ref 8.9–10.3)
Chloride: 104 mmol/L (ref 101–111)
Creatinine, Ser: 1.44 mg/dL — ABNORMAL HIGH (ref 0.44–1.00)
GFR calc Af Amer: 42 mL/min — ABNORMAL LOW (ref >60)
GFR, EST NON AFRICAN AMERICAN: 36 mL/min — AB (ref >60)
GLUCOSE: 85 mg/dL (ref 65–99)
POTASSIUM: 4.5 mmol/L (ref 3.5–5.1)
Sodium: 136 mmol/L (ref 135–145)

## 2017-10-26 LAB — CBC
HCT: 27.8 % — ABNORMAL LOW (ref 36.0–46.0)
Hemoglobin: 8.1 g/dL — ABNORMAL LOW (ref 12.0–15.0)
MCH: 25.3 pg — AB (ref 26.0–34.0)
MCHC: 29.1 g/dL — AB (ref 30.0–36.0)
MCV: 86.9 fL (ref 78.0–100.0)
PLATELETS: 230 10*3/uL (ref 150–400)
RBC: 3.2 MIL/uL — AB (ref 3.87–5.11)
RDW: 16.6 % — ABNORMAL HIGH (ref 11.5–15.5)
WBC: 7.6 10*3/uL (ref 4.0–10.5)

## 2017-10-26 LAB — PROTIME-INR
INR: 1.04
Prothrombin Time: 13.5 seconds (ref 11.4–15.2)

## 2017-10-26 SURGERY — CORONARY/GRAFT ANGIOGRAPHY
Anesthesia: LOCAL

## 2017-10-26 MED ORDER — SODIUM CHLORIDE 0.9 % IV SOLN: 250.0000 mL | INTRAVENOUS | Status: DC | PRN

## 2017-10-26 MED ORDER — CLONIDINE HCL 0.2 MG PO TABS
0.2000 mg | ORAL_TABLET | Freq: Three times a day (TID) | ORAL | 3 refills | Status: AC
Start: 2017-10-26 — End: ?

## 2017-10-26 MED ORDER — ATORVASTATIN CALCIUM 40 MG PO TABS: 40.0000 mg | ORAL_TABLET | Freq: Every day | ORAL | 3 refills | Status: AC

## 2017-10-26 MED ORDER — HEPARIN SODIUM (PORCINE) 5000 UNIT/ML IJ SOLN: 5000.0000 [IU] | Freq: Three times a day (TID) | INTRAMUSCULAR | Status: DC

## 2017-10-26 MED ORDER — ATORVASTATIN CALCIUM 80 MG PO TABS: 80.0000 mg | ORAL_TABLET | Freq: Every day | ORAL | Status: DC

## 2017-10-26 MED ORDER — LIDOCAINE HCL (PF) 1 % IJ SOLN
INTRAMUSCULAR | Status: DC | PRN
  Administered 2017-10-26: 18 mL via INTRADERMAL

## 2017-10-26 MED ORDER — LIDOCAINE HCL (PF) 1 % IJ SOLN
INTRAMUSCULAR | Status: AC
Start: 2017-10-26 — End: 2017-10-26
  Filled 2017-10-26: qty 30

## 2017-10-26 MED ORDER — ASPIRIN 81 MG PO CHEW: 81.0000 mg | CHEWABLE_TABLET | Freq: Every day | ORAL | Status: DC

## 2017-10-26 MED ORDER — DIAZEPAM 5 MG PO TABS: 5.0000 mg | ORAL_TABLET | Freq: Four times a day (QID) | ORAL | Status: DC | PRN

## 2017-10-26 MED ORDER — HEPARIN (PORCINE) IN NACL 2-0.9 UNIT/ML-% IJ SOLN
INTRAMUSCULAR | Status: AC | PRN
  Administered 2017-10-26: 1000 mL via INTRA_ARTERIAL

## 2017-10-26 MED ORDER — SODIUM CHLORIDE 0.9% FLUSH: 3.0000 mL | INTRAVENOUS | Status: DC | PRN

## 2017-10-26 MED ORDER — HEPARIN (PORCINE) IN NACL 2-0.9 UNIT/ML-% IJ SOLN
INTRAMUSCULAR | Status: AC
  Filled 2017-10-26: qty 1000

## 2017-10-26 MED ORDER — FENTANYL CITRATE (PF) 100 MCG/2ML IJ SOLN
INTRAMUSCULAR | Status: AC
  Filled 2017-10-26: qty 2

## 2017-10-26 MED ORDER — MIDAZOLAM HCL 2 MG/2ML IJ SOLN
INTRAMUSCULAR | Status: DC | PRN
  Administered 2017-10-26: 2 mg via INTRAVENOUS

## 2017-10-26 MED ORDER — MIDAZOLAM HCL 2 MG/2ML IJ SOLN
INTRAMUSCULAR | Status: AC
  Filled 2017-10-26: qty 2

## 2017-10-26 MED ORDER — SODIUM CHLORIDE 0.9% FLUSH: 3.0000 mL | Freq: Two times a day (BID) | INTRAVENOUS | Status: DC

## 2017-10-26 MED ORDER — IOPAMIDOL (ISOVUE-370) INJECTION 76%
INTRAVENOUS | Status: DC | PRN
Start: 2017-10-26 — End: 2017-10-26
  Administered 2017-10-26: 90 mL via INTRA_ARTERIAL

## 2017-10-26 MED ORDER — FENTANYL CITRATE (PF) 100 MCG/2ML IJ SOLN
INTRAMUSCULAR | Status: DC | PRN
  Administered 2017-10-26: 25 ug via INTRAVENOUS

## 2017-10-26 MED ORDER — ONDANSETRON HCL 4 MG/2ML IJ SOLN: 4.0000 mg | Freq: Four times a day (QID) | INTRAMUSCULAR | Status: DC | PRN

## 2017-10-26 MED ORDER — ASPIRIN 81 MG PO CHEW: 81.0000 mg | CHEWABLE_TABLET | Freq: Every day | ORAL | 3 refills | Status: AC

## 2017-10-26 MED ORDER — SODIUM CHLORIDE 0.9 % IV SOLN: INTRAVENOUS | Status: DC

## 2017-10-26 MED ORDER — ACETAMINOPHEN 325 MG PO TABS: 650.0000 mg | ORAL_TABLET | ORAL | Status: DC | PRN

## 2017-10-26 SURGICAL SUPPLY — 6 items
CATH INFINITI 5FR MULTPACK ANG (CATHETERS) ×1 IMPLANT
KIT HEART LEFT (KITS) ×2 IMPLANT
PACK CARDIAC CATHETERIZATION (CUSTOM PROCEDURE TRAY) ×2 IMPLANT
SHEATH PINNACLE 5F 10CM (SHEATH) ×1 IMPLANT
TRANSDUCER W/STOPCOCK (MISCELLANEOUS) ×2 IMPLANT
WIRE EMERALD 3MM-J .035X150CM (WIRE) ×1 IMPLANT

## 2017-10-26 NOTE — Progress Notes (Addendum)
Site area: RFA Site Prior to Removal:  Level 0 Pressure Applied For: 20 min Manual: yes   Patient Status During Pull:  stable Post Pull Site:  Level 0 Post Pull Instructions Given:  yes Post Pull Pulses Present: palpable Dressing Applied:  tegaderm Bedrest begins @ 1130 till 1530 Comments:

## 2017-10-26 NOTE — Progress Notes (Signed)
[facility]                                                                              Patient Demographics  Carrie [name], is a 66 y.o. female, DOB - September 04, 1951, DZH:[id number]  Admit date - 10/23/2017   Admitting Physician Kayleen Memos, DO  Outpatient Primary MD for the patient is Antonietta Jewel, MD  Outpatient specialists:   LOS - 2  days   Medical records reviewed and are as summarized below:    Chief Complaint  Patient presents with  . Chest Pain       Brief summary    Carrie [name] a 66 y.o.femalewith medical history significant ofCAD and AS s/p AVR with bovine valve and 3 vessel bypass back in 2015, HTN, history of A.Fib in past doesn't appear to be on thinners. Patient presents to the ED with c/o chest pain. Located in central chest with radiation to back. Like prior cardiac chest pain before surgery in 2015. Symptoms onset at 8pm, progressively worsening, associated N/V x2.   Assessment & Plan    Principal Problem:   Hypertensive urgency -BP improved on Catapres, hydralazine and Toprol. -Nitro drip weaned off.  Active Problems:   CAD (coronary artery disease) status post CABG with N STEMI -Mild elevation in troponin, patient was seen by cardiology given her history of CAD/CABG -Patient was placed on aspirin, Lipitor, beta-blocker, heparin infusion -Plan for cardiac cath today, unable to see the report.  Disposition pending cardiology clearance.  Hyperkalemia -Not on ACE or ARB, improved   Acute kidney injury on CKD stage III -Creatinine 1.0 on 12/2, peaked at 1.8, now improving, 1.4 today -Continue gentle hydration   Code Status: Full code DVT Prophylaxis: Heparin Family Communication: Discussed in detail with the patient, all imaging results, lab results explained to the patient   Disposition Plan: Awaiting cardiology clearance  Time Spent in minutes 25 minutes  Procedures:  Cardiac cath  Consultants:    Cardiology  Antimicrobials:      Medications  Scheduled Meds: . atorvastatin  40 mg Oral Daily  . cloNIDine  0.2 mg Oral TID  . hydrALAZINE  50 mg Oral BID  . levothyroxine  50 mcg Oral QAC breakfast  . metoprolol  200 mg Oral Daily   Continuous Infusions: . sodium chloride 1,000 mL (10/26/17 1126)  . heparin Stopped (10/26/17 1000)   PRN Meds:.acetaminophen **OR** acetaminophen, methocarbamol, ondansetron **OR** ondansetron (ZOFRAN) IV, oxyCODONE   Antibiotics   Anti-infectives (From admission, onward)   None        Subjective:   Carrie [name] was seen and examined today.  Patient denies dizziness, chest pain, shortness of breath, abdominal pain, N/V/D/C, new weakness, numbess, tingling. No acute events overnight.    Objective:   Vitals:   10/26/17 1115 10/26/17 1120 10/26/17 1125 10/26/17 1309  BP: (!) 102/54 (!) 113/51 (!) 110/49 129/62  Pulse: 75 75 74 (!) 53  Resp: 13 18 17 15   Temp:    98.7 F (37.1 C)  TempSrc:    Oral  SpO2: 97% 96% 98% 96%  Weight:      Height:  Intake/Output Summary (Last 24 hours) at 10/26/2017 1324 Last data filed at 10/26/2017 0626 Gross per 24 hour  Intake 2329.77 ml  Output 750 ml  Net 1579.77 ml     Wt Readings from Last 3 Encounters:  10/26/17 81.7 kg (180 lb 1.6 oz)  09/12/17 81.6 kg (180 lb)  09/06/17 81.6 kg (180 lb)     Exam  General: Alert and oriented x 3, NAD  Eyes:    Cardiovascular: S1 S2 auscultated, Regular rate and rhythm.  Respiratory: Clear to auscultation bilaterally, no wheezing, rales or rhonchi  Gastrointestinal: Soft, nontender, nondistended, + bowel sounds  Ext: no pedal edema bilaterally  Neuro: no new deficits  Musculoskeletal: No digital cyanosis, clubbing  Skin: No rashes  Psych: Normal affect and demeanor, alert and oriented x3    Data Reviewed:  I have personally reviewed following labs and imaging studies  Micro Results Recent Results (from the past 240  hour(s))  MRSA PCR Screening     Status: None   Collection Time: 10/24/17  9:19 PM  Result Value Ref Range Status   MRSA by PCR NEGATIVE NEGATIVE Final    Comment:        The GeneXpert MRSA Assay (FDA approved for NASAL specimens only), is one component of a comprehensive MRSA colonization surveillance program. It is not intended to diagnose MRSA infection nor to guide or monitor treatment for MRSA infections.     Radiology Reports Dg Chest 2 View  Result Date: 10/23/2017 CLINICAL DATA:  Acute chest pain. EXAM: CHEST  2 VIEW COMPARISON:  08/01/2014 and prior radiographs FINDINGS: Cardiomegaly and CABG/ aortic valve replacement again noted. There is no evidence of focal airspace disease, pulmonary edema, suspicious pulmonary nodule/mass, pleural effusion, or pneumothorax. No acute bony abnormalities are identified. IMPRESSION: Cardiomegaly without evidence of acute cardiopulmonary disease. Electronically Signed   By: Margarette Canada M.D.   On: 10/23/2017 23:44   Ct Angio Chest Pe W And/or Wo Contrast  Result Date: 10/24/2017 CLINICAL DATA:  66 year old female with chest pain. EXAM: CT ANGIOGRAPHY CHEST WITH CONTRAST TECHNIQUE: Multidetector CT imaging of the chest was performed using the standard protocol during bolus administration of intravenous contrast. Multiplanar CT image reconstructions and MIPs were obtained to evaluate the vascular anatomy. CONTRAST:  141mL ISOVUE-370 IOPAMIDOL (ISOVUE-370) INJECTION 76% COMPARISON:  Chest radiograph dated 10/23/2017 FINDINGS: Cardiovascular: There is mild cardiomegaly. No pericardial effusion. Multi vessel coronary vascular calcification with involvement of the LAD, RCA, and left circumflex artery. Mechanical aortic valve and CABG clips. There is moderate atherosclerotic calcification of the thoracic aorta. The origins of the great vessels of the aortic arch appear patent. There is no CT evidence of pulmonary embolism. Mediastinum/Nodes: No hilar or  mediastinal adenopathy. There is a small hiatal hernia. The esophagus is grossly unremarkable. Lungs/Pleura: Patchy ground-glass density throughout the lungs most consistent with atelectatic changes. Mild interstitial edema is not excluded. Clinical correlation is recommended. Trace right pleural effusion noted. No focal consolidation or pneumothorax. The central airways are patent. Upper Abdomen: Slight irregularity of the hepatic contour concerning for cirrhosis. Clinical correlation is recommended. Mildly enlarged portacaval lymph node. Musculoskeletal: Degenerative changes of the spine. Median sternotomy wires. No acute osseous pathology. Review of the MIP images confirms the above findings. IMPRESSION: 1. No CT evidence of pulmonary embolism. 2. Mild cardiomegaly, coronary vascular calcification, and mechanical aortic valve. 3. Trace right pleural effusion.  No focal consolidation. 4.  Aortic Atherosclerosis (ICD10-I70.0). Electronically Signed   By: Laren Everts.D.  On: 10/24/2017 00:27    Lab Data:  CBC: Recent Labs  Lab 10/23/17 2238 10/25/17 0404 10/25/17 1234 10/26/17 0438  WBC 13.5* 10.7* 9.5 7.6  HGB 8.8* 8.3* 8.0* 8.1*  HCT 28.5* 28.1* 27.0* 27.8*  MCV 84.3 86.5 87.1 86.9  PLT 204 245 233 035   Basic Metabolic Panel: Recent Labs  Lab 10/25/17 0404 10/25/17 0838 10/25/17 1234 10/25/17 1908 10/26/17 0438  NA 137 137 137 134* 136  K 5.6* 5.2* 6.0* 4.9 4.5  CL 102 104 105 105 104  CO2 25 21* 25 21* 23  GLUCOSE 114* 118* 100* 108* 85  BUN 22* 23* 24* 23* 22*  CREATININE 1.80* 1.87* 1.88* 1.60* 1.44*  CALCIUM 9.0 9.1 8.9 8.3* 8.7*   GFR: Estimated Creatinine Clearance: 40.3 mL/min (A) (by C-G formula based on SCr of 1.44 mg/dL (H)). Liver Function Tests: No results for input(s): AST, ALT, ALKPHOS, BILITOT, PROT, ALBUMIN in the last 168 hours. No results for input(s): LIPASE, AMYLASE in the last 168 hours. No results for input(s): AMMONIA in the last 168  hours. Coagulation Profile: Recent Labs  Lab 10/26/17 0438  INR 1.04   Cardiac Enzymes: Recent Labs  Lab 10/24/17 0132 10/24/17 1123 10/24/17 1409  TROPONINI 0.07* 0.24* 0.26*   BNP (last 3 results) No results for input(s): PROBNP in the last 8760 hours. HbA1C: No results for input(s): HGBA1C in the last 72 hours. CBG: No results for input(s): GLUCAP in the last 168 hours. Lipid Profile: No results for input(s): CHOL, HDL, LDLCALC, TRIG, CHOLHDL, LDLDIRECT in the last 72 hours. Thyroid Function Tests: No results for input(s): TSH, T4TOTAL, FREET4, T3FREE, THYROIDAB in the last 72 hours. Anemia Panel: No results for input(s): VITAMINB12, FOLATE, FERRITIN, TIBC, IRON, RETICCTPCT in the last 72 hours. Urine analysis:    Component Value Date/Time   COLORURINE YELLOW 07/03/2014 1228   APPEARANCEUR CLEAR 07/03/2014 1228   LABSPEC 1.014 07/03/2014 1228   PHURINE 6.0 07/03/2014 1228   GLUCOSEU NEGATIVE 07/03/2014 1228   HGBUR NEGATIVE 07/03/2014 1228   HGBUR negative 08/27/2009 [place] 07/03/2014 1228   BILIRUBINUR neg 05/24/2014 1428   KETONESUR NEGATIVE 07/03/2014 1228   PROTEINUR NEGATIVE 07/03/2014 1228   UROBILINOGEN 1.0 07/03/2014 1228   NITRITE NEGATIVE 07/03/2014 1228   LEUKOCYTESUR NEGATIVE 07/03/2014 1228     Ripudeep Rai M.D. [facility] 10/26/2017, 1:24 PM  Pager: [phone] Between 7am to 7pm - call Pager - [phone]  After 7pm go to [email or web address] - password [email or web address]  Call night coverage person covering after 7pm

## 2017-10-26 NOTE — Progress Notes (Signed)
ANTICOAGULATION CONSULT NOTE - Follow Up Consult  Pharmacy Consult for Heparin Indication: chest pain/ACS  Allergies  Allergen Reactions  . Morphine Nausea And Vomiting    ONLY IN IV FORM    Patient Measurements: Height: 5\' 6"  (167.6 cm) Weight: 180 lb 1.6 oz (81.7 kg) IBW/kg (Calculated) : 59.3 Heparin Dosing Weight: 76.5 kg  Vital Signs: Temp: 98.2 F (36.8 C) (12/05 0436) Temp Source: Oral (12/05 0436) BP: 145/62 (12/05 0436) Pulse Rate: 57 (12/05 0436)  Labs: Recent Labs    10/24/17 0132 10/24/17 1123 10/24/17 1409  10/25/17 0404  10/25/17 1234 10/25/17 1548 10/25/17 1908 10/26/17 0026 10/26/17 0438  HGB  --   --   --   --  8.3*  --  8.0*  --   --   --  8.1*  HCT  --   --   --   --  28.1*  --  27.0*  --   --   --  27.8*  PLT  --   --   --   --  245  --  233  --   --   --  230  LABPROT  --   --   --   --   --   --   --   --   --   --  13.5  INR  --   --   --   --   --   --   --   --   --   --  1.04  HEPARINUNFRC  --   --   --    < >  --    < >  --  0.20*  --  0.40 0.52  CREATININE  --   --   --   --  1.80*   < > 1.88*  --  1.60*  --  1.44*  TROPONINI 0.07* 0.24* 0.26*  --   --   --   --   --   --   --   --    < > = values in this interval not displayed.    Estimated Creatinine Clearance: 40.3 mL/min (A) (by C-G formula based on SCr of 1.44 mg/dL (H)).   Medications:  . sodium chloride    . sodium chloride 100 mL/hr at 10/26/17 0626  . heparin 1,400 Units/hr (10/26/17 7001)   Assessment: 66 year old female admitted with chest pain on heparin gtt. Heparin level is therapeutic x 2 on current rate. Hgb remains low but stable with no reported bleeding.   Goal of Therapy:  Heparin level 0.3-0.7 units/ml Monitor platelets by anticoagulation protocol: Yes   Plan:  1. Continue heparin infusion at 1400 units/hr 2. Daily heparin level  Vincenza Hews, PharmD, BCPS 10/26/2017, 7:52 AM

## 2017-10-26 NOTE — Plan of Care (Signed)
Pt transfers to [facility] with standby assist, steady gait. Reeducated on call light system. Verbalizes importance of calling for assistance prior to transfer

## 2017-10-26 NOTE — Progress Notes (Signed)
   Cardiac catheterization reassuring, reassuring bypass graft.  Please see full report for details once complete.  Okay for discharge later on today. Candee Furbish, MD

## 2017-10-26 NOTE — Progress Notes (Signed)
ANTICOAGULATION CONSULT NOTE - Follow Up Consult  Pharmacy Consult for Heparin Indication: chest pain/ACS  Allergies  Allergen Reactions  . Morphine Nausea And Vomiting    ONLY IN IV FORM    Patient Measurements: Height: 5\' 6"  (167.6 cm) Weight: 176 lb 6.4 oz (80 kg) IBW/kg (Calculated) : 59.3 Heparin Dosing Weight: 76.5 kg  Vital Signs: Temp: 97.8 F (36.6 C) (12/05 0000) Temp Source: Oral (12/05 0000) BP: 133/54 (12/05 0000) Pulse Rate: 56 (12/05 0000)  Labs: Recent Labs    10/23/17 2238 10/24/17 0132 10/24/17 1123 10/24/17 1409  10/25/17 0404 10/25/17 0720 10/25/17 0838 10/25/17 1234 10/25/17 1548 10/25/17 1908 10/26/17 0026  HGB 8.8*  --   --   --   --  8.3*  --   --  8.0*  --   --   --   HCT 28.5*  --   --   --   --  28.1*  --   --  27.0*  --   --   --   PLT 204  --   --   --   --  245  --   --  233  --   --   --   HEPARINUNFRC  --   --   --   --    < >  --  0.44  --   --  0.20*  --  0.40  CREATININE 1.09*  --   --   --   --  1.80*  --  1.87* 1.88*  --  1.60*  --   TROPONINI  --  0.07* 0.24* 0.26*  --   --   --   --   --   --   --   --    < > = values in this interval not displayed.    Estimated Creatinine Clearance: 35.9 mL/min (A) (by C-G formula based on SCr of 1.6 mg/dL (H)).   Medications:  . sodium chloride    . sodium chloride 100 mL/hr at 10/25/17 2152  . heparin 1,400 Units/hr (10/25/17 2152)   Assessment: 66 year old female admitted with chest pain on heparin. Heparin level therapeutic x 1 after rate increase.   Goal of Therapy:  Heparin level 0.3-0.7 units/ml Monitor platelets by anticoagulation protocol: Yes   Plan:  Cont heparin 1400 units/hr Confirmatory HL with AM labs  Narda Bonds, PharmD, [place] [facility] Pharmacist Phone: [phone]

## 2017-10-26 NOTE — Discharge Instructions (Signed)
Acute Pain, Adult Acute pain is a type of pain that may last for just a few days or as long as six months. It is often related to an illness, injury, or medical procedure. Acute pain may be mild, moderate, or severe. It usually goes away once your injury has healed or you are no longer ill. Pain can make it hard for you to do daily activities. It can cause anxiety and lead to other problems if left untreated. Treatment depends on the cause and severity of your acute pain. Follow these instructions at home:  Check your pain level as told by your health care provider.  Take over-the-counter and prescription medicines only as told by your health care provider.  If you are taking prescription pain medicine: ? Ask your health care provider about taking a stool softener or laxative to prevent constipation. ? Do not stop taking the medicine suddenly. Talk to your health care provider about how and when to discontinue prescription pain medicine. ? If your pain is severe, do not take more pills than instructed by your health care provider. ? Do not take other over-the-counter pain medicines in addition to this medicine unless told by your health care provider. ? Do not drive or operate heavy machinery while taking prescription pain medicine.  Apply ice or heat as told by your health care provider. These may reduce swelling and pain.  Ask your health care provider if other strategies such as distraction, relaxation, or physical therapies can help your pain.  Keep all follow-up visits as told by your health care provider. This is important. Contact a health care provider if:  You have pain that is not controlled by medicine.  Your pain does not improve or gets worse.  You have side effects from pain medicines, such as vomitingor confusion. Get help right away if:  You have severe pain.  You have trouble breathing.  You lose consciousness.  You have chest pain or pressure that lasts for more  than a few minutes. Along with the chest pain you may: ? Have pain or discomfort in one or both arms, your back, neck, jaw, or stomach. ? Have shortness of breath. ? Break out in a cold sweat. ? Feel nauseous. ? Become light-headed. These symptoms may represent a serious problem that is an emergency. Do not wait to see if the symptoms will go away. Get medical help right away. Call your local emergency services (911 in the [place] Do not drive yourself to the hospital. This information is not intended to replace advice given to you by your health care provider. Make sure you discuss any questions you have with your health care provider. Document Released: 11/23/2015 Document Revised: 04/16/2016 Document Reviewed: 11/23/2015 Elsevier Interactive Patient Education  2018 [employer].   Chest Wall Pain Chest wall pain is pain in or around the bones and muscles of your chest. Sometimes, an injury causes this pain. Sometimes, the cause may not be known. This pain may take several weeks or longer to get better. Follow these instructions at home: Pay attention to any changes in your symptoms. Take these actions to help with your pain:  Rest as told by your health care provider.  Avoid activities that cause pain. These include any activities that use your chest muscles or your abdominal and side muscles to lift heavy items.  If directed, apply ice to the painful area: ? Put ice in a plastic bag. ? Place a towel between your skin and the  bag. ? Leave the ice on for 20 minutes, 2-3 times per day.  Take over-the-counter and prescription medicines only as told by your health care provider.  Do not use tobacco products, including cigarettes, chewing tobacco, and e-cigarettes. If you need help quitting, ask your health care provider.  Keep all follow-up visits as told by your health care provider. This is important.  Contact a health care provider if:  You have a fever.  Your chest pain becomes  worse.  You have new symptoms. Get help right away if:  You have nausea or vomiting.  You feel sweaty or light-headed.  You have a cough with phlegm (sputum) or you cough up blood.  You develop shortness of breath. This information is not intended to replace advice given to you by your health care provider. Make sure you discuss any questions you have with your health care provider. Document Released: 11/08/2005 Document Revised: 03/18/2016 Document Reviewed: 02/03/2015 Elsevier Interactive Patient Education  2017 [employer].

## 2017-10-26 NOTE — Interval H&P Note (Signed)
Cath Lab Visit (complete for each Cath Lab visit)  Clinical Evaluation Leading to the Procedure:   ACS: Yes.    Non-ACS:    Anginal Classification: CCS IV  Anti-ischemic medical therapy: Maximal Therapy (2 or more classes of medications)  Non-Invasive Test Results: No non-invasive testing performed  Prior CABG: Previous CABG      History and Physical Interval Note:  10/26/2017 9:58 AM  Carrie [name]  has presented today for surgery, with the diagnosis of n stemi  The various methods of treatment have been discussed with the patient and family. After consideration of risks, benefits and other options for treatment, the patient has consented to  Procedure(s): LEFT HEART CATH AND CORS/GRAFTS ANGIOGRAPHY ([name]) as a surgical intervention .  The patient's history has been reviewed, patient examined, no change in status, stable for surgery.  I have reviewed the patient's chart and labs.  Questions were answered to the patient's satisfaction.     Shelva Majestic

## 2017-10-26 NOTE — Discharge Summary (Signed)
Physician Discharge Summary   Patient ID: Carrie [name] MRN: [id number] DOB/AGE: 06/09/1951 66 y.o.  Admit date: 10/23/2017 Discharge date: 10/26/2017  Primary Care Physician:  Carrie Jewel, MD  Discharge Diagnoses:      NSTEMI . Rebound hypertension . Hypertensive urgency . CAD (coronary artery disease) Hyperkalemia Acute kidney injury on CKD stage III   Consults: Cardiology  Recommendations for Outpatient Follow-up:  1. Please repeat CBC/BMET at next visit   DIET: Heart healthy diet    Allergies:   Allergies  Allergen Reactions  . Morphine Nausea And Vomiting    ONLY IN IV FORM     DISCHARGE MEDICATIONS: Allergies as of 10/26/2017      Reactions   Morphine Nausea And Vomiting   ONLY IN IV FORM      Medication List    TAKE these medications   atorvastatin 40 MG tablet Commonly known as:  LIPITOR Take 1 tablet (40 mg total) by mouth daily.   cloNIDine 0.2 MG tablet Commonly known as:  CATAPRES Take 1 tablet (0.2 mg total) by mouth 3 (three) times daily.   hydrALAZINE 50 MG tablet Commonly known as:  APRESOLINE Take 50 mg by mouth 2 (two) times daily.   levothyroxine 50 MCG tablet Commonly known as:  SYNTHROID, LEVOTHROID Take 1 tablet (50 mcg total) by mouth daily before breakfast.   methocarbamol 500 MG tablet Commonly known as:  ROBAXIN Take 1 tablet (500 mg total) by mouth every 6 (six) hours as needed for muscle spasms.   metoprolol 200 MG 24 hr tablet Commonly known as:  TOPROL-XL Take 200 mg by mouth daily.   Oxycodone HCl 10 MG Tabs Take 1-2 tablets (10-20 mg total) by mouth every 4 (four) hours as needed for moderate pain.        Brief H and P: For complete details please refer to admission H and P, but in briefSylvia Diane [name] a 66 y.o.femalewith medical history significant ofCAD and AS s/p AVR with bovine valve and 3 vessel bypass back in 2015, HTN, history of A.Fib in past doesn't appear to be on thinners. Patient  presents to the ED with c/o chest pain. Located in central chest with radiation to back. Like prior cardiac chest pain before surgery in 2015. Symptoms onset at 8pm, progressively worsening, associated N/V x2.  Hospital Course:  Hypertensive urgency -BP improved on Catapres, hydralazine and Toprol. -Nitro drip weaned off.  Active Problems:   CAD (coronary artery disease) status post CABG with N STEMI -Mild elevation in troponin, patient was seen by cardiology given her history of CAD/CABG -Patient was placed on aspirin, Lipitor, beta-blocker, heparin infusion -Troponin peaked at 0.26  -CT angiogram of the chest showed no pulmonary embolism -Cardiac cath was nonobstructive, no intervention/stent needed, grafts reassuring (please see detailed report)   Hyperkalemia -Not on ACE or ARB, improved   Acute kidney injury on CKD stage III -Creatinine 1.0 on 12/2, peaked at 1.8, now improving, 1.4 today -Continue gentle hydration   Day of Discharge BP 129/62 (BP Location: Right Arm)   Pulse (!) 53   Temp 98.7 F (37.1 C) (Oral)   Resp 15   Ht 5\' 6"  (1.676 m)   Wt 81.7 kg (180 lb 1.6 oz)   SpO2 96%   BMI 29.07 kg/m   Physical Exam: General: Alert and awake oriented x3 not in any acute distress. HEENT: anicteric sclera, pupils reactive to light and accommodation CVS: S1-S2 clear no murmur rubs or gallops Chest: clear to  auscultation bilaterally, no wheezing rales or rhonchi Abdomen: soft nontender, nondistended, normal bowel sounds Extremities: no cyanosis, clubbing or edema noted bilaterally Neuro: Cranial nerves II-XII intact, no focal neurological deficits   The results of significant diagnostics from this hospitalization (including imaging, microbiology, ancillary and laboratory) are listed below for reference.    LAB RESULTS: Basic Metabolic Panel: Recent Labs  Lab 10/25/17 1908 10/26/17 0438  NA 134* 136  K 4.9 4.5  CL 105 104  CO2 21* 23  GLUCOSE 108* 85   BUN 23* 22*  CREATININE 1.60* 1.44*  CALCIUM 8.3* 8.7*   Liver Function Tests: No results for input(s): AST, ALT, ALKPHOS, BILITOT, PROT, ALBUMIN in the last 168 hours. No results for input(s): LIPASE, AMYLASE in the last 168 hours. No results for input(s): AMMONIA in the last 168 hours. CBC: Recent Labs  Lab 10/25/17 1234 10/26/17 0438  WBC 9.5 7.6  HGB 8.0* 8.1*  HCT 27.0* 27.8*  MCV 87.1 86.9  PLT 233 230   Cardiac Enzymes: Recent Labs  Lab 10/24/17 1123 10/24/17 1409  TROPONINI 0.24* 0.26*   BNP: Invalid input(s): POCBNP CBG: No results for input(s): GLUCAP in the last 168 hours.  Significant Diagnostic Studies:  Dg Chest 2 View  Result Date: 10/23/2017 CLINICAL DATA:  Acute chest pain. EXAM: CHEST  2 VIEW COMPARISON:  08/01/2014 and prior radiographs FINDINGS: Cardiomegaly and CABG/ aortic valve replacement again noted. There is no evidence of focal airspace disease, pulmonary edema, suspicious pulmonary nodule/mass, pleural effusion, or pneumothorax. No acute bony abnormalities are identified. IMPRESSION: Cardiomegaly without evidence of acute cardiopulmonary disease. Electronically Signed   By: Margarette Canada M.D.   On: 10/23/2017 23:44   Ct Angio Chest Pe W And/or Wo Contrast  Result Date: 10/24/2017 CLINICAL DATA:  66 year old female with chest pain. EXAM: CT ANGIOGRAPHY CHEST WITH CONTRAST TECHNIQUE: Multidetector CT imaging of the chest was performed using the standard protocol during bolus administration of intravenous contrast. Multiplanar CT image reconstructions and MIPs were obtained to evaluate the vascular anatomy. CONTRAST:  135mL ISOVUE-370 IOPAMIDOL (ISOVUE-370) INJECTION 76% COMPARISON:  Chest radiograph dated 10/23/2017 FINDINGS: Cardiovascular: There is mild cardiomegaly. No pericardial effusion. Multi vessel coronary vascular calcification with involvement of the LAD, RCA, and left circumflex artery. Mechanical aortic valve and CABG clips. There is  moderate atherosclerotic calcification of the thoracic aorta. The origins of the great vessels of the aortic arch appear patent. There is no CT evidence of pulmonary embolism. Mediastinum/Nodes: No hilar or mediastinal adenopathy. There is a small hiatal hernia. The esophagus is grossly unremarkable. Lungs/Pleura: Patchy ground-glass density throughout the lungs most consistent with atelectatic changes. Mild interstitial edema is not excluded. Clinical correlation is recommended. Trace right pleural effusion noted. No focal consolidation or pneumothorax. The central airways are patent. Upper Abdomen: Slight irregularity of the hepatic contour concerning for cirrhosis. Clinical correlation is recommended. Mildly enlarged portacaval lymph node. Musculoskeletal: Degenerative changes of the spine. Median sternotomy wires. No acute osseous pathology. Review of the MIP images confirms the above findings. IMPRESSION: 1. No CT evidence of pulmonary embolism. 2. Mild cardiomegaly, coronary vascular calcification, and mechanical aortic valve. 3. Trace right pleural effusion.  No focal consolidation. 4.  Aortic Atherosclerosis (ICD10-I70.0). Electronically Signed   By: Anner Crete M.D.   On: 10/24/2017 00:27    2D ECHO:   Disposition and Follow-up: Discharge Instructions    Diet - low sodium heart healthy   Complete by:  As directed    Increase activity slowly  Complete by:  As directed        DISPOSITION: Home   DISCHARGE FOLLOW-UP Follow-up Information    Carrie Jewel, MD. Schedule an appointment as soon as possible for a visit in 2 week(s).   Specialty:  Internal Medicine Contact information: [address] [place] [address] [phone]        Dorothy Spark, MD. Schedule an appointment as soon as possible for a visit in 2 week(s).   Specialty:  Cardiology Contact information: [address] [place] [address] [phone]            Time  spent on Discharge: 35 minutes  Signed:   Estill Cotta M.D. [facility] 10/26/2017, 1:44 PM Pager: [phone]

## 2017-10-27 ENCOUNTER — Encounter (HOSPITAL_COMMUNITY): Payer: Self-pay | Admitting: Cardiovascular Disease

## 2018-04-22 DEATH — deceased
